# Patient Record
Sex: Male | Born: 1950 | Race: Asian | Hispanic: No | Marital: Married | Smoking: Former smoker
Health system: Southern US, Community
[De-identification: ages and names within clinical notes are randomized; demographics above are authoritative.]

## PROBLEM LIST (undated history)

## (undated) DIAGNOSIS — E119 Type 2 diabetes mellitus without complications: Secondary | ICD-10-CM

## (undated) DIAGNOSIS — I10 Essential (primary) hypertension: Secondary | ICD-10-CM

## (undated) DIAGNOSIS — K219 Gastro-esophageal reflux disease without esophagitis: Secondary | ICD-10-CM

## (undated) DIAGNOSIS — H269 Unspecified cataract: Secondary | ICD-10-CM

## (undated) DIAGNOSIS — M1990 Unspecified osteoarthritis, unspecified site: Secondary | ICD-10-CM

## (undated) HISTORY — PX: OTHER SURGICAL HISTORY: SHX169

## (undated) HISTORY — DX: Type 2 diabetes mellitus without complications: E11.9

## (undated) HISTORY — DX: Unspecified osteoarthritis, unspecified site: M19.90

## (undated) HISTORY — DX: Unspecified cataract: H26.9

---

## 2011-09-01 ENCOUNTER — Other Ambulatory Visit: Payer: Self-pay | Admitting: Infectious Diseases

## 2011-09-01 ENCOUNTER — Ambulatory Visit
Admission: RE | Admit: 2011-09-01 | Discharge: 2011-09-01 | Disposition: A | Discharge status: Home / Self Care | Payer: No Typology Code available for payment source | Source: Ambulatory Visit | Attending: Infectious Diseases | Admitting: Infectious Diseases

## 2011-09-01 DIAGNOSIS — R7611 Nonspecific reaction to tuberculin skin test without active tuberculosis: Secondary | ICD-10-CM

## 2012-07-19 ENCOUNTER — Emergency Department (HOSPITAL_COMMUNITY)
Admission: EM | Admit: 2012-07-19 | Discharge: 2012-07-20 | Disposition: A | Discharge status: Home / Self Care | Payer: Medicaid Other | Attending: Emergency Medicine | Admitting: Emergency Medicine

## 2012-07-19 ENCOUNTER — Encounter (HOSPITAL_COMMUNITY): Payer: Self-pay | Admitting: Emergency Medicine

## 2012-07-19 DIAGNOSIS — M545 Low back pain, unspecified: Secondary | ICD-10-CM

## 2012-07-19 NOTE — ED Notes (Signed)
Pt speaks [place]; son at bedside translating for pt; reports that pt was going to bathroom and sneezed, and pull muscle in L side/lower back; now c/o pain with movement; no loss of bowels; reports stiffness

## 2012-07-20 MED ORDER — KETOROLAC TROMETHAMINE 60 MG/2ML IM SOLN
60.0000 mg | Freq: Once | INTRAMUSCULAR | Status: AC
  Administered 2012-07-20: 60 mg via INTRAMUSCULAR
  Filled 2012-07-20: qty 2

## 2012-07-20 MED ORDER — IBUPROFEN 600 MG PO TABS: 600.0000 mg | ORAL_TABLET | Freq: Four times a day (QID) | ORAL | Status: AC | PRN

## 2012-07-20 MED ORDER — CYCLOBENZAPRINE HCL 5 MG PO TABS
5.0000 mg | ORAL_TABLET | Freq: Three times a day (TID) | ORAL | Status: AC | PRN
Start: 2012-07-20 — End: 2012-07-30

## 2012-07-20 NOTE — ED Provider Notes (Signed)
Medical screening examination/treatment/procedure(s) were performed by non-physician practitioner and as supervising physician I was immediately available for consultation/collaboration.  Arieal Cuoco K Hazell Siwik, MD 07/20/12 0707 

## 2012-07-20 NOTE — ED Provider Notes (Signed)
History     CSN: [id number]  Arrival date & time 07/19/12  2300   First MD Initiated Contact with Patient 07/20/12 0121      Chief Complaint  Patient presents with  . Back Pain    (Consider location/radiation/quality/duration/timing/severity/associated sxs/prior treatment) HPI Comments: Patient presents emergency department with chief complaint of back pain.  Translator for language [place] by son at bedside.  Onset of symptoms began acutely today after patient sneezed.  Described as left-sided muscular pain that worsens with certain movements including twisting and lifting left leg.  Patient denies any radicular pains including radiation, numbness or tingling of extremity, extremity weakness, loss control of bowel or bladder, fever, night sweats, chills, hematuria or any trauma.  No other complaints at this time.  Patient states that he did not have a history of kidney disease, kidney stones, or GI bleeds.  Patient is a 61 y.o. male presenting with back pain. The history is provided by the patient.  Back Pain     History reviewed. No pertinent past medical history.  History reviewed. No pertinent past surgical history.  No family history on file.  History  Substance Use Topics  . Smoking status: Never Smoker   . Smokeless tobacco: Not on file  . Alcohol Use: No      Review of Systems  Musculoskeletal: Positive for back pain. Negative for gait problem.  All other systems reviewed and are negative.    Allergies  Review of patient's allergies indicates no known allergies.  Home Medications  No current outpatient prescriptions on file.  BP 165/86  Temp 98.4 F (36.9 C) (Oral)  Resp 18  SpO2 95%  Physical Exam  Nursing note and vitals reviewed. Constitutional: He is oriented to person, place, and time. He appears well-developed and well-nourished. No distress.  HENT:  Head: Normocephalic and atraumatic.  Eyes: Conjunctivae and EOM are normal. Pupils are equal,  round, and reactive to light. No scleral icterus.  Neck: Normal range of motion and full passive range of motion without pain. Neck supple. No spinous process tenderness and no muscular tenderness present. No rigidity. Normal range of motion present. No Brudzinski's sign noted.  Cardiovascular: Normal rate, regular rhythm and intact distal pulses.  Exam reveals no gallop and no friction rub.   No murmur heard. Pulmonary/Chest: Effort normal and breath sounds normal. No respiratory distress. He has no wheezes. He has no rales. He exhibits no tenderness.  Genitourinary:       No vibrational CVA tenderness  Musculoskeletal:       Cervical back: He exhibits normal range of motion, no tenderness, no bony tenderness and no pain.       Thoracic back: He exhibits no tenderness, no bony tenderness and no pain.       Lumbar back: He exhibits tenderness and pain. He exhibits no spasm and normal pulse.       Back:       Right foot: He exhibits no swelling.       Left foot: He exhibits no swelling.       Bilateral lower extremities nontender without color change, baseline range of motion of extremities with intact distal pulses, capillary refill less than 2 seconds bilaterally.  Pt has increased pain w ROM of lumbar spine. Pain w ambulation, no sign of ataxia.  Neurological: He is alert and oriented to person, place, and time. He has normal strength and normal reflexes. No sensory deficit. Gait (no ataxia, slowed and hunched d/t pain )  abnormal.       Sensation at baseline for light touch in all 4 distal extremities, motor symmetric & bilateral 5/5 (hips: abduction, adduction, flexion; knee: flexion & extension; foot: dorsiflexion, plantar flexion, toes: dorsi flexion) Patellar & ankle reflexes intact.   Skin: Skin is warm and dry. No rash noted. He is not diaphoretic. No erythema. No pallor.  Psychiatric: He has a normal mood and affect.    ED Course  Procedures (including critical care time)  Labs  Reviewed - No data to display No results found.   No diagnosis found.    MDM  Low back pain  Patient with back pain likely etiology is muscular.  No neurological deficits and normal neuro exam.  Patient can walk but states is painful.  No loss of bowel or bladder control.  No concern for cauda equina.  No fever, night sweats, weight loss, h/o cancer, IVDU.  RICE protocol and pain medicine indicated and discussed with patient.         Jaci Carrel, [place] 07/20/12 0131

## 2012-07-20 NOTE — ED Notes (Signed)
Pt denies any questions upon discharge. 

## 2015-01-08 ENCOUNTER — Ambulatory Visit
Admission: RE | Admit: 2015-01-08 | Discharge: 2015-01-08 | Disposition: A | Discharge status: Home / Self Care | Payer: Medicaid Other | Source: Ambulatory Visit | Attending: Internal Medicine | Admitting: Internal Medicine

## 2015-01-08 ENCOUNTER — Other Ambulatory Visit: Payer: Self-pay | Admitting: Internal Medicine

## 2015-01-08 DIAGNOSIS — M25511 Pain in right shoulder: Secondary | ICD-10-CM

## 2016-08-09 ENCOUNTER — Encounter (HOSPITAL_COMMUNITY): Payer: Self-pay | Admitting: *Deleted

## 2016-08-09 ENCOUNTER — Emergency Department (HOSPITAL_COMMUNITY)
Admission: EM | Admit: 2016-08-09 | Discharge: 2016-08-09 | Disposition: A | Discharge status: Home / Self Care | Payer: Medicaid Other | Attending: Emergency Medicine | Admitting: Emergency Medicine

## 2016-08-09 ENCOUNTER — Emergency Department (HOSPITAL_COMMUNITY): Payer: Medicaid Other

## 2016-08-09 DIAGNOSIS — I10 Essential (primary) hypertension: Secondary | ICD-10-CM | POA: Insufficient documentation

## 2016-08-09 DIAGNOSIS — Z79899 Other long term (current) drug therapy: Secondary | ICD-10-CM | POA: Diagnosis not present

## 2016-08-09 DIAGNOSIS — R1011 Right upper quadrant pain: Secondary | ICD-10-CM | POA: Diagnosis present

## 2016-08-09 DIAGNOSIS — R1010 Upper abdominal pain, unspecified: Secondary | ICD-10-CM

## 2016-08-09 HISTORY — DX: Essential (primary) hypertension: I10

## 2016-08-09 LAB — CBC
HCT: 43 % (ref 39.0–52.0)
Hemoglobin: 15 g/dL (ref 13.0–17.0)
MCH: 30.5 pg (ref 26.0–34.0)
MCHC: 34.9 g/dL (ref 30.0–36.0)
MCV: 87.6 fL (ref 78.0–100.0)
Platelets: 147 10*3/uL — ABNORMAL LOW (ref 150–400)
RBC: 4.91 MIL/uL (ref 4.22–5.81)
RDW: 12.1 % (ref 11.5–15.5)
WBC: 4.8 10*3/uL (ref 4.0–10.5)

## 2016-08-09 LAB — COMPREHENSIVE METABOLIC PANEL
ALT: 33 U/L (ref 17–63)
AST: 33 U/L (ref 15–41)
Albumin: 4 g/dL (ref 3.5–5.0)
Alkaline Phosphatase: 75 U/L (ref 38–126)
Anion gap: 10 (ref 5–15)
BUN: 9 mg/dL (ref 6–20)
CO2: 23 mmol/L (ref 22–32)
Calcium: 8.8 mg/dL — ABNORMAL LOW (ref 8.9–10.3)
Chloride: 105 mmol/L (ref 101–111)
Creatinine, Ser: 0.99 mg/dL (ref 0.61–1.24)
GFR calc Af Amer: >60 mL/min (ref >60)
GFR calc non Af Amer: >60 mL/min (ref >60)
Glucose, Bld: 123 mg/dL — ABNORMAL HIGH (ref 65–99)
Potassium: 3.6 mmol/L (ref 3.5–5.1)
Sodium: 138 mmol/L (ref 135–145)
Total Bilirubin: 1.2 mg/dL (ref 0.3–1.2)
Total Protein: 6.7 g/dL (ref 6.5–8.1)

## 2016-08-09 LAB — URINALYSIS, ROUTINE W REFLEX MICROSCOPIC
BILIRUBIN URINE: NEGATIVE
Glucose, UA: NEGATIVE mg/dL
Hgb urine dipstick: NEGATIVE
Ketones, ur: NEGATIVE mg/dL
LEUKOCYTES UA: NEGATIVE
NITRITE: NEGATIVE
PH: 6 (ref 5.0–8.0)
Protein, ur: NEGATIVE mg/dL
SPECIFIC GRAVITY, URINE: 1.007 (ref 1.005–1.030)

## 2016-08-09 LAB — I-STAT TROPONIN, ED: Troponin i, poc: 0 ng/mL (ref 0.00–0.08)

## 2016-08-09 LAB — LIPASE, BLOOD: Lipase: 26 U/L (ref 11–51)

## 2016-08-09 MED ORDER — MORPHINE SULFATE (PF) 4 MG/ML IV SOLN
4.0000 mg | Freq: Once | INTRAVENOUS | Status: AC
  Administered 2016-08-09: 4 mg via INTRAVENOUS
  Filled 2016-08-09: qty 1

## 2016-08-09 MED ORDER — ESOMEPRAZOLE MAGNESIUM 40 MG PO CPDR: 40.0000 mg | DELAYED_RELEASE_CAPSULE | Freq: Every day | ORAL | 0 refills | Status: DC

## 2016-08-09 MED ORDER — SUCRALFATE 1 G PO TABS: 1.0000 g | ORAL_TABLET | Freq: Three times a day (TID) | ORAL | 0 refills | Status: DC

## 2016-08-09 MED ORDER — SODIUM CHLORIDE 0.9 % IV BOLUS (SEPSIS)
1000.0000 mL | Freq: Once | INTRAVENOUS | Status: AC
  Administered 2016-08-09: 1000 mL via INTRAVENOUS

## 2016-08-09 NOTE — ED Provider Notes (Signed)
[facility] DEPT Provider Note   CSN: [id number] Arrival date & time: 08/09/16  [id number]  First Provider Contact:  First MD Initiated Contact with Patient 08/09/16 0446        History   Chief Complaint Chief Complaint  Patient presents with  . Abdominal Pain    HPI Jorge [name] is a 65 y.o. male history hypertension here presenting with right upper quadrant pain. Right upper quadrant pain radiating to the back for the last several days. It is intermittent and cramping. Associated with some nausea but no vomiting. Patient saw primary care doctor several days ago was thought to have reflux or gallbladder disease. He was scheduled for a ultrasound but hasn't had it yet. Yesterday, he states that pain suddenly became worse and it was 7/10. Denies dysuria or hematuria. Denies vomiting or fevers.   The history is provided by the patient.    Past Medical History:  Diagnosis Date  . Hypertension     There are no active problems to display for this patient.   History reviewed. No pertinent surgical history.     Home Medications    Prior to Admission medications   Medication Sig Start Date End Date Taking? Authorizing Provider  amLODipine (NORVASC) 5 MG tablet Take 5 mg by mouth daily. 07/16/16  Yes Historical Provider, MD  atorvastatin (LIPITOR) 10 MG tablet Take 10 mg by mouth at bedtime. 08/03/16  Yes Historical Provider, MD  hydrochlorothiazide (HYDRODIURIL) 12.5 MG tablet Take 12.5 mg by mouth daily. 08/03/16  Yes Historical Provider, MD  omeprazole (PRILOSEC) 20 MG capsule Take 20 mg by mouth 2 (two) times daily. 08/03/16  Yes Historical Provider, MD    Family History History reviewed. No pertinent family history.  Social History Social History  Substance Use Topics  . Smoking status: Never Smoker  . Smokeless tobacco: Never Used  . Alcohol use No     Allergies   Review of patient's allergies indicates no known allergies.   Review of Systems Review of Systems    Gastrointestinal: Positive for abdominal pain.  All other systems reviewed and are negative.    Physical Exam Updated Vital Signs BP 143/84   Pulse (!) 52   Temp 97.6 F (36.4 C) (Oral)   Resp 17   Ht 5' (1.524 m)   Wt 180 lb (81.6 kg)   SpO2 94%   BMI 35.15 kg/m   Physical Exam  Constitutional: He is oriented to person, place, and time.  Slightly uncomfortable   HENT:  Head: Normocephalic.  Eyes: EOM are normal. Pupils are equal, round, and reactive to light.  Neck: Normal range of motion. Neck supple.  Cardiovascular: Normal rate, regular rhythm and normal heart sounds.   Pulmonary/Chest: Effort normal and breath sounds normal.  Abdominal: Soft. Bowel sounds are normal.  Mild RUQ tenderness, no rebound. No murphy's sign. No epigastric or CVA or lower abdominal tenderness   Musculoskeletal: Normal range of motion.  Neurological: He is alert and oriented to person, place, and time.  Skin: Skin is warm.  Psychiatric: He has a normal mood and affect.  Nursing note and vitals reviewed.    ED Treatments / Results  Labs (all labs ordered are listed, but only abnormal results are displayed) Labs Reviewed  COMPREHENSIVE METABOLIC PANEL - Abnormal; Notable for the following:       Result Value   Glucose, Bld 123 (*)    Calcium 8.8 (*)    All other components within normal limits  CBC -  Abnormal; Notable for the following:    Platelets 147 (*)    All other components within normal limits  LIPASE, BLOOD  URINALYSIS, ROUTINE W REFLEX MICROSCOPIC (NOT AT [facility])  I-STAT TROPOININ, ED    EKG  EKG Interpretation  Date/Time:  Sunday August 09 2016 04:52:46 EDT Ventricular Rate:  53 PR Interval:    QRS Duration: 91 QT Interval:  406 QTC Calculation: 382 R Axis:   39 Text Interpretation:  Sinus rhythm ST elevation suggests acute pericarditis No previous ECGs available Confirmed by YAO  MD, DAVID ([id number]) on 08/09/2016 4:55:27 AM       Radiology US Abdomen Limited  Ruq  Result Date: 08/09/2016 CLINICAL DATA:  Acute onset of right upper quadrant abdominal pain. Initial encounter. EXAM: US ABDOMEN LIMITED - RIGHT UPPER QUADRANT COMPARISON:  None. FINDINGS: Gallbladder: No gallstones or wall thickening visualized. No sonographic Murphy sign noted by sonographer. Common bile duct: Diameter: 0.2 cm, within normal in caliber. Liver: No focal lesion identified. Within normal limits in parenchymal echogenicity. IMPRESSION: Unremarkable ultrasound of the right upper quadrant. Electronically Signed   By: Garald Balding M.D.   On: 08/09/2016 06:41    Procedures Procedures (including critical care time)  Medications Ordered in ED Medications  sodium chloride 0.9 % bolus 1,000 mL (1,000 mLs Intravenous New Bag/Given 08/09/16 0515)  morphine 4 MG/ML injection 4 mg (4 mg Intravenous Given 08/09/16 0516)     Initial Impression / Assessment and Plan / ED Course  I have reviewed the triage vital signs and the nursing notes.  Pertinent labs & imaging results that were available during my care of the patient were reviewed by me and considered in my medical decision making (see chart for details).  Clinical Course    Jorge [name] is a 65 y.o. male here with abdominal pain. Consider biliary colic vs gastritis vs reflux. Less likely renal colic or inferior MI. Will get labs, EKG, lipase, trop x 1, [place] RUQ.   6:49 AM Labs and RUQ US unremarkable. UA unremarkable, no blood in the urine. He is pain free. Likely reflux vs stomach ulcer. Will start nexium, carafate. Will refer to GI.    Final Clinical Impressions(s) / ED Diagnoses   Final diagnoses:  RUQ pain    New Prescriptions New Prescriptions   No medications on file     Drenda Freeze, MD 08/09/16 [phone]

## 2016-08-09 NOTE — ED Notes (Signed)
Patient transported to Ultrasound 

## 2016-08-09 NOTE — ED Notes (Signed)
Language barrier. Family member at bedside to translate.

## 2016-08-09 NOTE — Discharge Instructions (Signed)
Take nexium daily.   Take carafate as needed for abdominal pain with meals.   See your doctor and call GI doctor.   Return to ER if you have severe abdominal pain, vomiting.

## 2016-08-09 NOTE — ED Notes (Signed)
Patient returned from US.

## 2016-08-09 NOTE — ED Triage Notes (Signed)
Pt reports RUQ starting last night. Pt denies n/v/d, denies dysuria, constipation. Pt describes the pain as intermittent cramping, pain radiates to right shoulder blade

## 2016-11-15 ENCOUNTER — Encounter (HOSPITAL_COMMUNITY): Payer: Self-pay | Admitting: *Deleted

## 2016-11-15 ENCOUNTER — Emergency Department (HOSPITAL_COMMUNITY): Payer: Medicaid Other

## 2016-11-15 ENCOUNTER — Emergency Department (HOSPITAL_COMMUNITY)
Admission: EM | Admit: 2016-11-15 | Discharge: 2016-11-15 | Disposition: A | Discharge status: Home / Self Care | Payer: Medicaid Other | Attending: Emergency Medicine | Admitting: Emergency Medicine

## 2016-11-15 DIAGNOSIS — M5442 Lumbago with sciatica, left side: Secondary | ICD-10-CM | POA: Diagnosis not present

## 2016-11-15 DIAGNOSIS — R339 Retention of urine, unspecified: Secondary | ICD-10-CM

## 2016-11-15 DIAGNOSIS — M545 Low back pain: Secondary | ICD-10-CM | POA: Diagnosis present

## 2016-11-15 DIAGNOSIS — I10 Essential (primary) hypertension: Secondary | ICD-10-CM | POA: Diagnosis not present

## 2016-11-15 DIAGNOSIS — E119 Type 2 diabetes mellitus without complications: Secondary | ICD-10-CM | POA: Insufficient documentation

## 2016-11-15 DIAGNOSIS — M5432 Sciatica, left side: Secondary | ICD-10-CM

## 2016-11-15 HISTORY — DX: Type 2 diabetes mellitus without complications: E11.9

## 2016-11-15 LAB — URINALYSIS, ROUTINE W REFLEX MICROSCOPIC
Bilirubin Urine: NEGATIVE
GLUCOSE, UA: NEGATIVE mg/dL
HGB URINE DIPSTICK: NEGATIVE
Ketones, ur: NEGATIVE mg/dL
Leukocytes, UA: NEGATIVE
Nitrite: NEGATIVE
PROTEIN: NEGATIVE mg/dL
SPECIFIC GRAVITY, URINE: 1.01 (ref 1.005–1.030)
pH: 6.5 (ref 5.0–8.0)

## 2016-11-15 LAB — COMPREHENSIVE METABOLIC PANEL
ALBUMIN: 4.4 g/dL (ref 3.5–5.0)
ALT: 30 U/L (ref 17–63)
AST: 27 U/L (ref 15–41)
Alkaline Phosphatase: 71 U/L (ref 38–126)
Anion gap: 11 (ref 5–15)
BUN: 18 mg/dL (ref 6–20)
CHLORIDE: 106 mmol/L (ref 101–111)
CO2: 19 mmol/L — AB (ref 22–32)
CREATININE: 0.94 mg/dL (ref 0.61–1.24)
Calcium: 9.4 mg/dL (ref 8.9–10.3)
GFR calc non Af Amer: >60 mL/min (ref >60)
Glucose, Bld: 147 mg/dL — ABNORMAL HIGH (ref 65–99)
Potassium: 4 mmol/L (ref 3.5–5.1)
SODIUM: 136 mmol/L (ref 135–145)
Total Bilirubin: 1.1 mg/dL (ref 0.3–1.2)
Total Protein: 7 g/dL (ref 6.5–8.1)

## 2016-11-15 LAB — CBC
HCT: 43.2 % (ref 39.0–52.0)
Hemoglobin: 16 g/dL (ref 13.0–17.0)
MCH: 32.3 pg (ref 26.0–34.0)
MCHC: 37 g/dL — ABNORMAL HIGH (ref 30.0–36.0)
MCV: 87.1 fL (ref 78.0–100.0)
PLATELETS: 163 10*3/uL (ref 150–400)
RBC: 4.96 MIL/uL (ref 4.22–5.81)
RDW: 12.8 % (ref 11.5–15.5)
WBC: 7.1 10*3/uL (ref 4.0–10.5)

## 2016-11-15 LAB — LIPASE, BLOOD: LIPASE: 26 U/L (ref 11–51)

## 2016-11-15 LAB — D-DIMER, QUANTITATIVE: D-Dimer, Quant: 0.32 ug/mL-FEU (ref 0.00–0.50)

## 2016-11-15 LAB — CBG MONITORING, ED: Glucose-Capillary: 134 mg/dL — ABNORMAL HIGH (ref 65–99)

## 2016-11-15 MED ORDER — HYDROCODONE-ACETAMINOPHEN 5-325 MG PO TABS: 2.0000 | ORAL_TABLET | ORAL | 0 refills | Status: DC | PRN

## 2016-11-15 MED ORDER — PREDNISONE 20 MG PO TABS: 60.0000 mg | ORAL_TABLET | Freq: Every day | ORAL | 0 refills | Status: AC

## 2016-11-15 MED ORDER — HYDROCODONE-ACETAMINOPHEN 5-325 MG PO TABS
2.0000 | ORAL_TABLET | Freq: Once | ORAL | Status: AC
  Administered 2016-11-15: 2 via ORAL
  Filled 2016-11-15: qty 2

## 2016-11-15 MED ORDER — SENNOSIDES-DOCUSATE SODIUM 8.6-50 MG PO TABS: 1.0000 | ORAL_TABLET | Freq: Every day | ORAL | 0 refills | Status: DC

## 2016-11-15 NOTE — ED Notes (Signed)
Pt was placed on 3L [place], O2 sats 88 RA. Pt also placed on cardiac monitor. EDP aware

## 2016-11-15 NOTE — ED Triage Notes (Signed)
The pt is c/o rt flank pain for 3 days  No nv no bloody urine

## 2016-11-15 NOTE — ED Provider Notes (Signed)
Blood pressure 119/76, pulse (!) 45, temperature 97.4 F (36.3 C), resp. rate 19, height 5\' 1"  (1.549 m), weight 185 lb 3.2 oz (84 kg), SpO2 96 %.  Assuming care from Dr. Wyvonnia Dusky.  In short, Jorge [name] is a 65 y.o. male with a chief complaint of Flank Pain .  Refer to the original H&P for additional details.  The current plan of care is to follow MRI and reassess.  09:48 AM MRI resulted with read reviewed. No severe spinal canal stenosis. 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 With his urinary retention and I plan to discuss the case with neurosurgery to arrange close outpatient follow-up.  10:07 AM Spoke with Dr. Cyndy Freeze with NSG. He has reviewed the MRI and sees no acute finding to explain the patient's post void residual. He does have disc disease and may require surgery at some point. He would like to have the patient follow with him in 2 weeks. We will discharge home with steroids and pain medication. The patient is urinating here without difficulty.   Nanda Quinton, MD   Margette Fast, MD 11/16/16 [phone]

## 2016-11-15 NOTE — ED Provider Notes (Signed)
[facility] DEPT Provider Note   CSN: [id number] Arrival date & time: 11/15/16  0105     History   Chief Complaint Chief Complaint  Patient presents with  . Flank Pain    HPI Jorge [name] is a 65 y.o. male.  Level V caveat for language barrier. History obtained through son. Son reports three-day history of right lower back pain rating down right leg. No falls no injuries. Contrary to triage note there is no flank pain. No nausea no vomiting noted hematuria or dysuria. Patient denies any focal weakness, numbness or tingling. No bowel or bladder incontinence. No fever or vomiting. No chest pain or shortness of breath. Has a chronic cough which is unchanged. Initially hypoxic upon arrival to treatment room. Denies any shortness of breath or chest pain. Denies any IV drug abuse or history of cancer. No recent travel outside the country. No saddle anesthesia.   The history is provided by the patient and a relative. The history is limited by a language barrier.  Flank Pain  Pertinent negatives include no chest pain, no abdominal pain, no headaches and no shortness of breath.    Past Medical History:  Diagnosis Date  . Diabetes mellitus without complication ([place])   . Hypertension     There are no active problems to display for this patient.   History reviewed. No pertinent surgical history.     Home Medications    Prior to Admission medications   Medication Sig Start Date End Date Taking? Authorizing Provider  amLODipine (NORVASC) 5 MG tablet Take 5 mg by mouth daily. 07/16/16   Historical Provider, MD  atorvastatin (LIPITOR) 10 MG tablet Take 10 mg by mouth at bedtime. 08/03/16   Historical Provider, MD  esomeprazole (NEXIUM) 40 MG capsule Take 1 capsule (40 mg total) by mouth daily. 08/09/16   Drenda Freeze, MD  hydrochlorothiazide (HYDRODIURIL) 12.5 MG tablet Take 12.5 mg by mouth daily. 08/03/16   Historical Provider, MD  omeprazole (PRILOSEC) 20 MG capsule Take 20  mg by mouth 2 (two) times daily. 08/03/16   Historical Provider, MD  sucralfate (CARAFATE) 1 g tablet Take 1 tablet (1 g total) by mouth 4 (four) times daily -  with meals and at bedtime. 08/09/16   Drenda Freeze, MD    Family History No family history on file.  Social History Social History  Substance Use Topics  . Smoking status: Never Smoker  . Smokeless tobacco: Never Used  . Alcohol use No     Allergies   Patient has no known allergies.   Review of Systems Review of Systems  Constitutional: Negative for activity change, appetite change, fever and unexpected weight change.  HENT: Negative for congestion and rhinorrhea.   Eyes: Negative for visual disturbance.  Respiratory: Positive for cough. Negative for chest tightness and shortness of breath.   Cardiovascular: Negative for chest pain.  Gastrointestinal: Negative for abdominal pain, nausea and vomiting.  Genitourinary: Negative for flank pain.  Musculoskeletal: Positive for back pain. Negative for arthralgias.  Neurological: Negative for dizziness, seizures, weakness, numbness and headaches.   A complete 10 system review of systems was obtained and all systems are negative except as noted in the HPI and PMH.    Physical Exam Updated Vital Signs BP 158/87   Pulse (!) 55   Temp 97.4 F (36.3 C)   Resp 14   Ht 5\' 1"  (1.549 m)   Wt 185 lb 3.2 oz (84 kg)   SpO2 93%  BMI 34.99 kg/m   Physical Exam  Constitutional: He is oriented to person, place, and time. He appears well-developed and well-nourished. No distress.  HENT:  Head: Normocephalic and atraumatic.  Mouth/Throat: Oropharynx is clear and moist. No oropharyngeal exudate.  Eyes: Conjunctivae and EOM are normal. Pupils are equal, round, and reactive to light.  Neck: Normal range of motion. Neck supple.  No meningismus.  Cardiovascular: Normal rate, regular rhythm, normal heart sounds and intact distal pulses.   No murmur heard. Pulmonary/Chest: Effort  normal and breath sounds normal. No respiratory distress.  Abdominal: Soft. There is no tenderness. There is no rebound and no guarding.  Musculoskeletal: Normal range of motion. He exhibits tenderness. He exhibits no edema.  TTP R SI joint  5/5 strength in bilateral lower extremities. Ankle plantar and dorsiflexion intact. Great toe extension intact bilaterally. +2 DP and PT pulses. +2 patellar reflexes bilaterally. Normal gait.   Neurological: He is alert and oriented to person, place, and time. No cranial nerve deficit. He exhibits normal muscle tone. Coordination normal.   5/5 strength throughout. CN 2-12 intact.Equal grip strength.   Skin: Skin is warm.  Psychiatric: He has a normal mood and affect. His behavior is normal.  Nursing note and vitals reviewed.    ED Treatments / Results  Labs (all labs ordered are listed, but only abnormal results are displayed) Labs Reviewed  COMPREHENSIVE METABOLIC PANEL - Abnormal; Notable for the following:       Result Value   CO2 19 (*)    Glucose, Bld 147 (*)    All other components within normal limits  CBC - Abnormal; Notable for the following:    MCHC 37.0 (*)    All other components within normal limits  CBG MONITORING, ED - Abnormal; Notable for the following:    Glucose-Capillary 134 (*)    All other components within normal limits  LIPASE, BLOOD  URINALYSIS, ROUTINE W REFLEX MICROSCOPIC (NOT AT [facility])  D-DIMER, QUANTITATIVE (NOT AT [facility])    EKG  EKG Interpretation  Date/Time:  Sunday November 15 2016 05:38:19 EST Ventricular Rate:  58 PR Interval:    QRS Duration: 92 QT Interval:  398 QTC Calculation: 391 R Axis:   35 Text Interpretation:  Sinus rhythm Abnormal inferior Q waves ST elevation suggests acute pericarditis Baseline wander in lead(s) V3 No significant change was found Confirmed by Wyvonnia Dusky  MD, Yuniel Blaney [id number]) on 11/15/2016 6:45:46 AM       Radiology No results found.  Procedures Procedures (including  critical care time)  Medications Ordered in ED Medications  HYDROcodone-acetaminophen (NORCO/VICODIN) 5-325 MG per tablet 2 tablet (not administered)     Initial Impression / Assessment and Plan / ED Course  I have reviewed the triage vital signs and the nursing notes.  Pertinent labs & imaging results that were available during my care of the patient were reviewed by me and considered in my medical decision making (see chart for details).  Clinical Course   3 days of right-sided lower back pain rating to the right leg appears to be consistent with sciatica. Contrary to triage note there is no flank pain. No focal weakness, numbness or tingling. No bowel or bladder incontinence. There are no neurological red flags.  Urinalysis is negative. CXR and D-dimer negative. Patient denies SOB or CP.  Patient has 450 mL of postvoid residual was severe right sided sciatica pain. Plan to obtain MRI and given urinary retention.  MRI pending for sciatica symptoms with urinary retention.  Dr. Laverta Baltimore to assume care.  Anticipate discharge home with steroid burst if MRI reassuring with possible Foley catheter if still retaining.  Final Clinical Impressions(s) / ED Diagnoses   Final diagnoses:  Urinary retention  Sciatica of left side    New Prescriptions New Prescriptions   No medications on file     Ezequiel Essex, MD 11/15/16 [phone]

## 2016-11-15 NOTE — Discharge Instructions (Signed)
You have been seen in the Emergency Department (ED)  today for back pain.  Your workup and exam have not shown any acute abnormalities and you are likely suffering from muscle strain or possible problems with your discs, but there is no treatment that will fix your symptoms at this time.  Please take Motrin (ibuprofen) as needed for your pain according to the instructions written on the box.  Alternatively, for the next five days you can take 600mg  three times daily with meals (it may upset your stomach).  Take Norco as prescribed for severe pain. Do not drink alcohol, drive or participate in any other potentially dangerous activities while taking this medication as it may make you sleepy. Do not take this medication with any other sedating medications, either prescription or over-the-counter. If you were prescribed Percocet or Vicodin, do not take these with acetaminophen (Tylenol) as it is already contained within these medications.   This medication is an opiate (or narcotic) pain medication and can be habit forming.  Use it as little as possible to achieve adequate pain control.  Do not use or use it with extreme caution if you have a history of opiate abuse or dependence.  If you are on a pain contract with your primary care doctor or a pain specialist, be sure to let them know you were prescribed this medication today from the [facility] Emergency Department.  This medication is intended for your use only - do not give any to anyone else and keep it in a secure place where nobody else, especially children, have access to it.  It will also cause or worsen constipation, so you may want to consider taking an over-the-counter stool softener while you are taking this medication.  Please follow up with your doctor as soon as possible regarding today's ED visit and your back pain.  Return to the ED for worsening back pain, fever, weakness or numbness of either leg, or if you develop either (1) an  inability to urinate or have bowel movements, or (2) loss of your ability to control your bathroom functions (if you start having "accidents"), or if you develop other new symptoms that concern you.

## 2016-11-25 ENCOUNTER — Emergency Department (HOSPITAL_COMMUNITY)
Admission: EM | Admit: 2016-11-25 | Discharge: 2016-11-25 | Disposition: A | Discharge status: Home / Self Care | Payer: Medicaid Other | Attending: Emergency Medicine | Admitting: Emergency Medicine

## 2016-11-25 ENCOUNTER — Encounter (HOSPITAL_COMMUNITY): Payer: Self-pay | Admitting: *Deleted

## 2016-11-25 DIAGNOSIS — E119 Type 2 diabetes mellitus without complications: Secondary | ICD-10-CM | POA: Diagnosis not present

## 2016-11-25 DIAGNOSIS — M5020 Other cervical disc displacement, unspecified cervical region: Secondary | ICD-10-CM | POA: Diagnosis not present

## 2016-11-25 DIAGNOSIS — I10 Essential (primary) hypertension: Secondary | ICD-10-CM | POA: Insufficient documentation

## 2016-11-25 DIAGNOSIS — M545 Low back pain: Secondary | ICD-10-CM | POA: Diagnosis present

## 2016-11-25 DIAGNOSIS — M50222 Other cervical disc displacement at C5-C6 level: Secondary | ICD-10-CM

## 2016-11-25 DIAGNOSIS — Z79899 Other long term (current) drug therapy: Secondary | ICD-10-CM | POA: Insufficient documentation

## 2016-11-25 LAB — URINALYSIS, ROUTINE W REFLEX MICROSCOPIC
BILIRUBIN URINE: NEGATIVE
Glucose, UA: NEGATIVE mg/dL
HGB URINE DIPSTICK: NEGATIVE
Ketones, ur: NEGATIVE mg/dL
Leukocytes, UA: NEGATIVE
NITRITE: NEGATIVE
PROTEIN: NEGATIVE mg/dL
SPECIFIC GRAVITY, URINE: 1.017 (ref 1.005–1.030)
pH: 6.5 (ref 5.0–8.0)

## 2016-11-25 LAB — BASIC METABOLIC PANEL
Anion gap: 8 (ref 5–15)
BUN: 14 mg/dL (ref 6–20)
CO2: 24 mmol/L (ref 22–32)
CREATININE: 0.84 mg/dL (ref 0.61–1.24)
Calcium: 9 mg/dL (ref 8.9–10.3)
Chloride: 105 mmol/L (ref 101–111)
Glucose, Bld: 94 mg/dL (ref 65–99)
Potassium: 4.2 mmol/L (ref 3.5–5.1)
SODIUM: 137 mmol/L (ref 135–145)

## 2016-11-25 MED ORDER — OXYCODONE-ACETAMINOPHEN 5-325 MG PO TABS: 1.0000 | ORAL_TABLET | ORAL | 0 refills | Status: DC | PRN

## 2016-11-25 MED ORDER — ORPHENADRINE CITRATE ER 100 MG PO TB12: 100.0000 mg | ORAL_TABLET | Freq: Two times a day (BID) | ORAL | 0 refills | Status: DC

## 2016-11-25 MED ORDER — NAPROXEN 375 MG PO TABS: 375.0000 mg | ORAL_TABLET | Freq: Two times a day (BID) | ORAL | 0 refills | Status: DC

## 2016-11-25 MED ORDER — DOCUSATE SODIUM 100 MG PO CAPS: 100.0000 mg | ORAL_CAPSULE | Freq: Two times a day (BID) | ORAL | 0 refills | Status: DC

## 2016-11-25 MED ORDER — PREDNISONE 20 MG PO TABS
60.0000 mg | ORAL_TABLET | Freq: Once | ORAL | Status: AC
  Administered 2016-11-25: 60 mg via ORAL
  Filled 2016-11-25: qty 3

## 2016-11-25 MED ORDER — KETOROLAC TROMETHAMINE 60 MG/2ML IM SOLN
60.0000 mg | Freq: Once | INTRAMUSCULAR | Status: AC
  Administered 2016-11-25: 60 mg via INTRAMUSCULAR
  Filled 2016-11-25: qty 2

## 2016-11-25 MED ORDER — HYDROMORPHONE HCL 2 MG/ML IJ SOLN
2.0000 mg | Freq: Once | INTRAMUSCULAR | Status: AC
  Administered 2016-11-25: 2 mg via INTRAMUSCULAR
  Filled 2016-11-25: qty 1

## 2016-11-25 NOTE — ED Notes (Signed)
Phlebotomy at bedside.

## 2016-11-25 NOTE — Consult Note (Signed)
Jorge [name] is a 65 y.o. male Whom has made two trips to the ED for right lower extremity pain. Initially on 11/19 he had urinary retention, and right lower extremity pain. An MRI revealed two herniated discs eccentric to the right side at L3/4, and 4/5. He was supposed to schedule an appointment with my partner in the office. Due to a mix up he came back to the ED today with severe pain in the right lower extremity. He completed a course of steroids, and pain medication.  No Known Allergies History reviewed. No pertinent family history., Past Medical History:  Diagnosis Date  . Diabetes mellitus without complication ([place])   . Hypertension    History reviewed. No pertinent surgical history. Social History   Social History  . Marital status: Married    Spouse name: N/A  . Number of children: N/A  . Years of education: N/A   Occupational History  . Not on file.   Social History Main Topics  . Smoking status: Never Smoker  . Smokeless tobacco: Never Used  . Alcohol use No  . Drug use: No  . Sexual activity: Not on file   Other Topics Concern  . Not on file   Social History Narrative  . No narrative on file   Prior to Admission medications   Medication Sig Start Date End Date Taking? Authorizing Provider  amLODipine (NORVASC) 5 MG tablet Take 5 mg by mouth daily. 07/16/16  Yes Historical Provider, MD  esomeprazole (NEXIUM) 40 MG capsule Take 1 capsule (40 mg total) by mouth daily. Patient not taking: Reported on 11/25/2016 08/09/16   Drenda Freeze, MD  hydrochlorothiazide (HYDRODIURIL) 12.5 MG tablet Take 12.5 mg by mouth daily. 08/03/16   Historical Provider, MD  HYDROcodone-acetaminophen (NORCO/VICODIN) 5-325 MG tablet Take 2 tablets by mouth every 4 (four) hours as needed. Patient not taking: Reported on 11/25/2016 11/15/16   Margette Fast, MD  senna-docusate (SENOKOT-S) 8.6-50 MG tablet Take 1 tablet by mouth daily. Patient not taking: Reported on 11/25/2016 11/15/16    Margette Fast, MD  sucralfate (CARAFATE) 1 g tablet Take 1 tablet (1 g total) by mouth 4 (four) times daily -  with meals and at bedtime. Patient not taking: Reported on 11/25/2016 08/09/16   Drenda Freeze, MD  Physical Exam  Constitutional: He is oriented to person, place, and time. He appears well-developed and well-nourished.  HENT:  Head: Normocephalic and atraumatic.  Right Ear: External ear normal.  Left Ear: External ear normal.  Nose: Nose normal.  Mouth/Throat: Oropharynx is clear and moist.  Eyes: Conjunctivae and EOM are normal. Pupils are equal, round, and reactive to light.  Neck: Normal range of motion. Neck supple.  Cardiovascular: Normal rate, regular rhythm, normal heart sounds and intact distal pulses.   Pulmonary/Chest: Effort normal and breath sounds normal.  +cough  Abdominal: Soft. Bowel sounds are normal.  Musculoskeletal: Normal range of motion.  Neurological: He is alert and oriented to person, place, and time. He has normal reflexes. He displays normal reflexes. No cranial nerve deficit. He exhibits normal muscle tone. He displays a negative Romberg sign. Coordination normal. He displays no Babinski's sign on the right side. He displays no Babinski's sign on the left side.  Reflex Scores:      Tricep reflexes are 2+ on the right side and 2+ on the left side.      Bicep reflexes are 2+ on the right side and 2+ on the left side.  Brachioradialis reflexes are 2+ on the right side and 2+ on the left side.      Patellar reflexes are 2+ on the right side and 2+ on the left side.      Achilles reflexes are 2+ on the right side and 2+ on the left side. Mild weakness right dorsiflexors 4/5, ehl 4/5  Skin: Skin is warm and dry.  Psychiatric: He has a normal mood and affect. His behavior is normal. Judgment and thought content normal.  a/p Would like time to think about treatment. Will advise ed to discharge and he will follow up in the office.  Understands that he  has two disc herniations, I reviewed the mri with he and his daughter who acted as the interpreter.

## 2016-11-25 NOTE — ED Notes (Signed)
Neuro surgery at bedside.

## 2016-11-25 NOTE — ED Notes (Signed)
Bladder scan resulted at 274ml

## 2016-11-25 NOTE — ED Provider Notes (Signed)
[facility] DEPT Provider Note   CSN: [id number] Arrival date & time: 11/25/16  0755     History   Chief Complaint Chief Complaint  Patient presents with  . Back Pain  . Leg Pain    HPI Jorge [name] is a 65 y.o. male.Language barrier. Patient's daughter acts as Optometrist.  HPI Patient was seen 10 days ago with lower back pain. MRI obtained and L4-5 disc protrusion impinging on right L5 nerve identified. Patient was discharged with pain control steroids. Plan was to make follow-up appointment with Dr. Cyndy Freeze. Patient has finished his medications and is having the same pain which is severe in his SI region and radiating down the right leg.  Now he is endorsing some numbness in the leg as well. Patient has not developed, fever, chills, abdominal pain. He is endorsing some degree of discomfort with urination. The patient's daughter reports that they have not made a follow-up appointment with Dr. Cyndy Freeze. She states that the patient was initially seen with her brother for the first ED visit and she did not know that he needed a follow-up appointment with a specialist. Past Medical History:  Diagnosis Date  . Diabetes mellitus without complication ([place])   . Hypertension     There are no active problems to display for this patient.   History reviewed. No pertinent surgical history.     Home Medications    Prior to Admission medications   Medication Sig Start Date End Date Taking? Authorizing Provider  amLODipine (NORVASC) 5 MG tablet Take 5 mg by mouth daily. 07/16/16  Yes Historical Provider, MD  esomeprazole (NEXIUM) 40 MG capsule Take 1 capsule (40 mg total) by mouth daily. Patient not taking: Reported on 11/25/2016 08/09/16   Drenda Freeze, MD  hydrochlorothiazide (HYDRODIURIL) 12.5 MG tablet Take 12.5 mg by mouth daily. 08/03/16   Historical Provider, MD  HYDROcodone-acetaminophen (NORCO/VICODIN) 5-325 MG tablet Take 2 tablets by mouth every 4 (four) hours as  needed. Patient not taking: Reported on 11/25/2016 11/15/16   Margette Fast, MD  naproxen (NAPROSYN) 375 MG tablet Take 1 tablet (375 mg total) by mouth 2 (two) times daily. 11/25/16   Charlesetta Shanks, MD  orphenadrine (NORFLEX) 100 MG tablet Take 1 tablet (100 mg total) by mouth 2 (two) times daily. 11/25/16   Charlesetta Shanks, MD  oxyCODONE-acetaminophen (PERCOCET) 5-325 MG tablet Take 1-2 tablets by mouth every 4 (four) hours as needed. 11/25/16   Charlesetta Shanks, MD  senna-docusate (SENOKOT-S) 8.6-50 MG tablet Take 1 tablet by mouth daily. Patient not taking: Reported on 11/25/2016 11/15/16   Margette Fast, MD  sucralfate (CARAFATE) 1 g tablet Take 1 tablet (1 g total) by mouth 4 (four) times daily -  with meals and at bedtime. Patient not taking: Reported on 11/25/2016 08/09/16   Drenda Freeze, MD    Family History History reviewed. No pertinent family history.  Social History Social History  Substance Use Topics  . Smoking status: Never Smoker  . Smokeless tobacco: Never Used  . Alcohol use No     Allergies   Patient has no known allergies.   Review of Systems Review of Systems 10 Systems reviewed and are negative for acute change except as noted in the HPI.  Physical Exam Updated Vital Signs BP 140/69   Pulse 64   Temp 97.3 F (36.3 C) (Oral)   Resp 16   SpO2 94%   Physical Exam  Constitutional: He appears well-developed and well-nourished.  HENT:  Head: Normocephalic and atraumatic.  Eyes: Conjunctivae are normal.  Neck: Neck supple.  Cardiovascular: Normal rate and regular rhythm.   No murmur heard. Pulmonary/Chest: Effort normal and breath sounds normal. No respiratory distress.  Abdominal: Soft. He exhibits no distension and no mass. There is tenderness. There is no guarding.  Endorses mild suprapubic discomfort to palpation. No palpable mass or bladder distention.  Musculoskeletal: He exhibits no edema.  Straight leg raise on the right causes significant  pain. No peripheral edema or calf tenderness. Skin condition and muscular development of lower extremities is very good.  Neurological: He is alert. No cranial nerve deficit. Coordination normal.  Patient does have intact flexion and extension strength to the right lower extremity. He however does exhibit significant pain with performing strength testing. He endorses subjective decreased light touch sensation to the right lower extremity as compared to the left.  Skin: Skin is warm and dry.  Psychiatric: He has a normal mood and affect.  Nursing note and vitals reviewed.    ED Treatments / Results  Labs (all labs ordered are listed, but only abnormal results are displayed) Labs Reviewed  URINALYSIS, [place] (NOT AT [facility])  BASIC METABOLIC PANEL    EKG  EKG Interpretation None       Radiology No results found.  Procedures Procedures (including critical care time)  Medications Ordered in ED Medications  ketorolac (TORADOL) injection 60 mg (60 mg Intramuscular Given 11/25/16 1024)  HYDROmorphone (DILAUDID) injection 2 mg (2 mg Intramuscular Given 11/25/16 1024)  predniSONE (DELTASONE) tablet 60 mg (60 mg Oral Given 11/25/16 1024)     Initial Impression / Assessment and Plan / ED Course  I have reviewed the triage vital signs and the nursing notes.  Pertinent labs & imaging results that were available during my care of the patient were reviewed by me and considered in my medical decision making (see chart for details).  Clinical Course     Consult: Dr. Cyndy Freeze has evaluated the patient in the emergency department. Plan will be for outpatient follow-up with the pain control.  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   ORPHENADRINE (NORFLEX) 100 MG  TABLET    Take 1 tablet (100 mg total) by mouth 2 (two) times daily.   OXYCODONE-ACETAMINOPHEN (PERCOCET) 5-325 MG TABLET    Take 1-2 tablets by mouth every 4 (four) hours as needed.     Charlesetta Shanks, MD 11/25/16 [phone]

## 2016-11-25 NOTE — ED Triage Notes (Signed)
Pt reports right side back and leg pain for a few days. Denies injury to back. Has difficulty ambulating and reports mild numbness to right leg. Denies urinary symptoms.

## 2016-11-26 ENCOUNTER — Emergency Department (HOSPITAL_COMMUNITY)
Admission: EM | Admit: 2016-11-26 | Discharge: 2016-11-26 | Disposition: A | Discharge status: Home / Self Care | Payer: Medicaid Other | Attending: Emergency Medicine | Admitting: Emergency Medicine

## 2016-11-26 ENCOUNTER — Encounter (HOSPITAL_COMMUNITY): Payer: Self-pay | Admitting: Emergency Medicine

## 2016-11-26 DIAGNOSIS — Z79899 Other long term (current) drug therapy: Secondary | ICD-10-CM | POA: Insufficient documentation

## 2016-11-26 DIAGNOSIS — I10 Essential (primary) hypertension: Secondary | ICD-10-CM | POA: Insufficient documentation

## 2016-11-26 DIAGNOSIS — E119 Type 2 diabetes mellitus without complications: Secondary | ICD-10-CM | POA: Diagnosis not present

## 2016-11-26 DIAGNOSIS — M5136 Other intervertebral disc degeneration, lumbar region: Secondary | ICD-10-CM

## 2016-11-26 DIAGNOSIS — M5126 Other intervertebral disc displacement, lumbar region: Secondary | ICD-10-CM | POA: Diagnosis present

## 2016-11-26 MED ORDER — OXYCODONE-ACETAMINOPHEN 5-325 MG PO TABS
ORAL_TABLET | ORAL | Status: DC
Start: 2016-11-26 — End: 2016-11-26
  Filled 2016-11-26: qty 1

## 2016-11-26 MED ORDER — OXYCODONE-ACETAMINOPHEN 5-325 MG PO TABS
1.0000 | ORAL_TABLET | Freq: Once | ORAL | Status: AC
  Administered 2016-11-26: 1 via ORAL

## 2016-11-26 MED ORDER — HYDROMORPHONE HCL 2 MG/ML IJ SOLN
2.0000 mg | Freq: Once | INTRAMUSCULAR | Status: AC
  Administered 2016-11-26: 2 mg via INTRAVENOUS
  Filled 2016-11-26: qty 1

## 2016-11-26 NOTE — ED Triage Notes (Signed)
Pt. reports persistent right lower back pain radiating to right buttocks and right leg for several weeks , denies injury , pain increases with movement /changing positions . He was seen here yesterday for the same complaint discharged home with prescriptions .

## 2016-11-26 NOTE — ED Provider Notes (Signed)
[facility] DEPT Provider Note   CSN: [id number] Arrival date & time: 11/26/16  0504     History   Chief Complaint Chief Complaint  Patient presents with  . Back Pain    HPI Jorge [name] is a 65 y.o. male.  The history is provided by the patient and a relative. The history is limited by a language barrier. A language interpreter was used (Son).  Back Pain    Patient was seen yesterday for the same complaint. Yesterday. At that time neurosurgery was consult said, Dr. Cyndy Freeze, and patient was to follow up outpatient. Patient was discharged home with Naprosyn, Norflex, and Percocet. Prior to yesterday the patient was seen 11 days ago for the same complaint and at that time an MRI was obtained which showed an L4-5 disc protrusion impinging on the L5 nerve, and again neurosurgery was consulted and requested outpatient follow up. At that time, he was discharged home with steroid burst and pain medication. He has only taken the percocet which alleviates his pain.  They are going to fill the remaining prescriptions today.   Past Medical History:  Diagnosis Date  . Diabetes mellitus without complication ([place])   . Hypertension     There are no active problems to display for this patient.   History reviewed. No pertinent surgical history.     Home Medications    Prior to Admission medications   Medication Sig Start Date End Date Taking? Authorizing Provider  amLODipine (NORVASC) 5 MG tablet Take 5 mg by mouth daily. 07/16/16  Yes Historical Provider, MD  docusate sodium (COLACE) 100 MG capsule Take 1 capsule (100 mg total) by mouth every 12 (twelve) hours. 11/25/16  Yes Charlesetta Shanks, MD  hydrochlorothiazide (HYDRODIURIL) 12.5 MG tablet Take 12.5 mg by mouth daily. 08/03/16  Yes Historical Provider, MD  naproxen (NAPROSYN) 375 MG tablet Take 1 tablet (375 mg total) by mouth 2 (two) times daily. 11/25/16  Yes Charlesetta Shanks, MD  esomeprazole (NEXIUM) 40 MG capsule Take 1 capsule  (40 mg total) by mouth daily. Patient not taking: Reported on 11/26/2016 08/09/16   Drenda Freeze, MD  HYDROcodone-acetaminophen (NORCO/VICODIN) 5-325 MG tablet Take 2 tablets by mouth every 4 (four) hours as needed. Patient not taking: Reported on 11/26/2016 11/15/16   Margette Fast, MD  orphenadrine (NORFLEX) 100 MG tablet Take 1 tablet (100 mg total) by mouth 2 (two) times daily. 11/25/16   Charlesetta Shanks, MD  oxyCODONE-acetaminophen (PERCOCET) 5-325 MG tablet Take 1-2 tablets by mouth every 4 (four) hours as needed. 11/25/16   Charlesetta Shanks, MD  senna-docusate (SENOKOT-S) 8.6-50 MG tablet Take 1 tablet by mouth daily. Patient not taking: Reported on 11/26/2016 11/15/16   Margette Fast, MD  sucralfate (CARAFATE) 1 g tablet Take 1 tablet (1 g total) by mouth 4 (four) times daily -  with meals and at bedtime. Patient not taking: Reported on 11/26/2016 08/09/16   Drenda Freeze, MD    Family History No family history on file.  Social History Social History  Substance Use Topics  . Smoking status: Never Smoker  . Smokeless tobacco: Never Used  . Alcohol use No     Allergies   Patient has no known allergies.   Review of Systems Review of Systems  Musculoskeletal: Positive for back pain. Negative for gait problem.  All other systems reviewed and are negative.    Physical Exam Updated Vital Signs BP 147/85   Pulse 62   Temp 98.6 F (37  C) (Oral)   Resp 14   SpO2 93%   Physical Exam  Constitutional: He is oriented to person, place, and time. He appears well-developed and well-nourished.  HENT:  Head: Atraumatic.  Eyes: Conjunctivae are normal.  Cardiovascular: Normal rate, regular rhythm, normal heart sounds and intact distal pulses.   Pulses:      Dorsalis pedis pulses are 2+ on the right side, and 2+ on the left side.  Pulmonary/Chest: Effort normal and breath sounds normal.  Abdominal: Soft. Bowel sounds are normal. He exhibits no distension. There is no  tenderness.  Musculoskeletal: He exhibits tenderness.  No spinous process tenderness.  No step offs. No crepitus.  Neurological: He is alert and oriented to person, place, and time.  No saddle anesthesia. Strength and sensation intact bilaterally throughout lower extremities. Pain with strength testing on the right.  Skin: Skin is warm and dry.  Psychiatric: He has a normal mood and affect. His behavior is normal.     ED Treatments / Results  Labs (all labs ordered are listed, but only abnormal results are displayed) Labs Reviewed - No data to display  EKG  EKG Interpretation None       Radiology No results found.  Procedures Procedures (including critical care time)  Medications Ordered in ED Medications  oxyCODONE-acetaminophen (PERCOCET/ROXICET) 5-325 MG per tablet (not administered)  oxyCODONE-acetaminophen (PERCOCET/ROXICET) 5-325 MG per tablet 1 tablet (1 tablet Oral Given 11/26/16 0519)  HYDROmorphone (DILAUDID) injection 2 mg (2 mg Intravenous Given 11/26/16 0727)     Initial Impression / Assessment and Plan / ED Course  I have reviewed the triage vital signs and the nursing notes.  Pertinent labs & imaging results that were available during my care of the patient were reviewed by me and considered in my medical decision making (see chart for details).  Clinical Course    Patient presents with persistent back pain with known disc protrusion.  Neurosurgery has recommend outpatient follow up.  Patient has prescriptions for Norflex, Percocet, and Naprosyn.  He has only taken Percocet.  No focal neurological deficits today.  No concerning symptoms to warrant repeat imaging at this time.  There seems to be confusion on neurosurgery follow up, and patient and son thought they were to keep coming here.  Discussed that they were to call the number provided and go to that listed address.  They voiced understanding, and are calling them at 9 AM today when the office opens.   Return precautions discussed.  Stable for discharge.    Final Clinical Impressions(s) / ED Diagnoses   Final diagnoses:  Protrusion of lumbar intervertebral disc  Bulging of lumbar intervertebral disc    New Prescriptions Discharge Medication List as of 11/26/2016  8:11 AM       Gloriann Loan, PA-C 11/26/16 [id number]    Isla Pence, MD 11/26/16 [id number]

## 2016-11-28 ENCOUNTER — Encounter (HOSPITAL_COMMUNITY): Payer: Self-pay | Admitting: *Deleted

## 2016-11-28 ENCOUNTER — Emergency Department (HOSPITAL_COMMUNITY)
Admission: EM | Admit: 2016-11-28 | Discharge: 2016-11-28 | Disposition: A | Discharge status: Home / Self Care | Payer: Medicaid Other | Attending: Emergency Medicine | Admitting: Emergency Medicine

## 2016-11-28 DIAGNOSIS — Z79899 Other long term (current) drug therapy: Secondary | ICD-10-CM | POA: Insufficient documentation

## 2016-11-28 DIAGNOSIS — I10 Essential (primary) hypertension: Secondary | ICD-10-CM | POA: Insufficient documentation

## 2016-11-28 DIAGNOSIS — M5416 Radiculopathy, lumbar region: Secondary | ICD-10-CM | POA: Diagnosis not present

## 2016-11-28 DIAGNOSIS — E119 Type 2 diabetes mellitus without complications: Secondary | ICD-10-CM | POA: Insufficient documentation

## 2016-11-28 DIAGNOSIS — M545 Low back pain: Secondary | ICD-10-CM | POA: Diagnosis present

## 2016-11-28 MED ORDER — DIAZEPAM 5 MG PO TABS
10.0000 mg | ORAL_TABLET | Freq: Once | ORAL | Status: AC
  Administered 2016-11-28: 10 mg via ORAL
  Filled 2016-11-28: qty 2

## 2016-11-28 MED ORDER — MORPHINE SULFATE 15 MG PO TABS: 15.0000 mg | ORAL_TABLET | Freq: Four times a day (QID) | ORAL | 0 refills | Status: DC | PRN

## 2016-11-28 MED ORDER — HYDROMORPHONE HCL 2 MG/ML IJ SOLN
2.0000 mg | Freq: Once | INTRAMUSCULAR | Status: AC
  Administered 2016-11-28: 2 mg via INTRAMUSCULAR
  Filled 2016-11-28: qty 1

## 2016-11-28 MED ORDER — ONDANSETRON 8 MG PO TBDP
8.0000 mg | ORAL_TABLET | Freq: Once | ORAL | Status: AC
  Administered 2016-11-28: 8 mg via ORAL
  Filled 2016-11-28: qty 1

## 2016-11-28 MED ORDER — DIAZEPAM 10 MG PO TABS: 10.0000 mg | ORAL_TABLET | Freq: Three times a day (TID) | ORAL | 0 refills | Status: DC | PRN

## 2016-11-28 NOTE — ED Provider Notes (Signed)
[facility] DEPT Provider Note   CSN: [id number] Arrival date & time: 11/28/16  1851     History   Chief Complaint Chief Complaint  Patient presents with  . Hip Pain    HPI Jorge [name] is a 65 y.o. male.  Patient reports pain, lower back and right leg, with numbness in the right leg, unable to be controlled with current medicines which she is taking. His had multiple visits recently and is scheduled for a decompression surgery for L4-5 radiculopathy, recently diagnosed on MRI imaging. Completed a course of prednisone about 2 weeks ago. He is taking stool softeners along with Naprosyn and Percocet. Is been no fever, vomiting, or weakness. There are no other known modifying factors.   HPI  Past Medical History:  Diagnosis Date  . Diabetes mellitus without complication ([place])   . Hypertension     There are no active problems to display for this patient.   History reviewed. No pertinent surgical history.     Home Medications    Prior to Admission medications   Medication Sig Start Date End Date Taking? Authorizing Provider  amLODipine (NORVASC) 5 MG tablet Take 5 mg by mouth daily. 07/16/16  Yes Historical Provider, MD  docusate sodium (COLACE) 100 MG capsule Take 1 capsule (100 mg total) by mouth every 12 (twelve) hours. 11/25/16  Yes Charlesetta Shanks, MD  hydrochlorothiazide (HYDRODIURIL) 12.5 MG tablet Take 12.5 mg by mouth daily. 08/03/16  Yes Historical Provider, MD  naproxen (NAPROSYN) 375 MG tablet Take 1 tablet (375 mg total) by mouth 2 (two) times daily. 11/25/16  Yes Charlesetta Shanks, MD  diazepam (VALIUM) 10 MG tablet Take 1 tablet (10 mg total) by mouth every 8 (eight) hours as needed (muscle spasm). 11/28/16   Daleen Bo, MD  esomeprazole (NEXIUM) 40 MG capsule Take 1 capsule (40 mg total) by mouth daily. Patient not taking: Reported on 11/28/2016 08/09/16   Drenda Freeze, MD  morphine (MSIR) 15 MG tablet Take 1 tablet (15 mg total) by mouth every 6 (six)  hours as needed for severe pain. 11/28/16   Daleen Bo, MD  orphenadrine (NORFLEX) 100 MG tablet Take 1 tablet (100 mg total) by mouth 2 (two) times daily. Patient not taking: Reported on 11/28/2016 11/25/16   Charlesetta Shanks, MD  senna-docusate (SENOKOT-S) 8.6-50 MG tablet Take 1 tablet by mouth daily. Patient not taking: Reported on 11/28/2016 11/15/16   Margette Fast, MD  sucralfate (CARAFATE) 1 g tablet Take 1 tablet (1 g total) by mouth 4 (four) times daily -  with meals and at bedtime. Patient not taking: Reported on 11/28/2016 08/09/16   Drenda Freeze, MD    Family History No family history on file.  Social History Social History  Substance Use Topics  . Smoking status: Never Smoker  . Smokeless tobacco: Never Used  . Alcohol use No     Allergies   Patient has no known allergies.   Review of Systems Review of Systems  All other systems reviewed and are negative.    Physical Exam Updated Vital Signs BP 154/95 (BP Location: Right Arm)   Pulse 76   Temp 97.5 F (36.4 C) (Oral)   Resp 17   SpO2 94%   Physical Exam  Constitutional: He is oriented to person, place, and time. He appears well-developed and well-nourished.  HENT:  Head: Normocephalic and atraumatic.  Right Ear: External ear normal.  Left Ear: External ear normal.  Eyes: Conjunctivae and EOM are normal. Pupils  are equal, round, and reactive to light.  Neck: Normal range of motion and phonation normal. Neck supple.  Cardiovascular: Normal rate.   Pulmonary/Chest: Effort normal. He exhibits no bony tenderness.  Musculoskeletal: Normal range of motion.  No deformity of the arms and legs bilaterally.  Neurological: He is alert and oriented to person, place, and time. No cranial nerve deficit or sensory deficit. He exhibits normal muscle tone. Coordination normal.  Decreased light touch sensation right leg.  Skin: Skin is warm, dry and intact.  Psychiatric: He has a normal mood and affect. His behavior  is normal. Judgment and thought content normal.  Nursing note and vitals reviewed.    ED Treatments / Results  Labs (all labs ordered are listed, but only abnormal results are displayed) Labs Reviewed - No data to display  EKG  EKG Interpretation None       Radiology No results found.  Procedures Procedures (including critical care time)  Medications Ordered in ED Medications  HYDROmorphone (DILAUDID) injection 2 mg (2 mg Intramuscular Given 11/28/16 2057)  ondansetron (ZOFRAN-ODT) disintegrating tablet 8 mg (8 mg Oral Given 11/28/16 2058)  diazepam (VALIUM) tablet 10 mg (10 mg Oral Given 11/28/16 2058)     Initial Impression / Assessment and Plan / ED Course  I have reviewed the triage vital signs and the nursing notes.  Pertinent labs & imaging results that were available during my care of the patient were reviewed by me and considered in my medical decision making (see chart for details).  Clinical Course     Medications  HYDROmorphone (DILAUDID) injection 2 mg (2 mg Intramuscular Given 11/28/16 2057)  ondansetron (ZOFRAN-ODT) disintegrating tablet 8 mg (8 mg Oral Given 11/28/16 2058)  diazepam (VALIUM) tablet 10 mg (10 mg Oral Given 11/28/16 2058)    Patient Vitals for the past 24 hrs:  BP Temp Temp src Pulse Resp SpO2  11/28/16 2058 154/95 - - 76 17 94 %  11/28/16 1859 162/84 97.5 F (36.4 C) Oral 74 18 97 %    10:42 PM Reevaluation with update and discussion. After initial assessment and treatment, an updated evaluation reveals Patient reports he is feeling better. Findings discussed with the patient, and his son, all questions answered.Daleen Bo L    Final Clinical Impressions(s) / ED Diagnoses   Final diagnoses:  Lumbar radiculopathy    Lumbar radiculopathy, currently under treatment, and has received a course of prednisone. Doubt spinal myelopathy, new back injury or metabolic instability.  Nursing Notes Reviewed/ Care Coordinated Applicable  Imaging Reviewed Interpretation of Laboratory Data incorporated into ED treatment  The patient appears reasonably screened and/or stabilized for discharge and I doubt any other medical condition or other [facility] requiring further screening, evaluation, or treatment in the ED at this time prior to discharge.  Plan: Home Medications- stop Percocet, for now.; Home Treatments- rest, heat; return here if the recommended treatment, does not improve the symptoms; Recommended follow up- neurosurgery this week as scheduled   New Prescriptions New Prescriptions   DIAZEPAM (VALIUM) 10 MG TABLET    Take 1 tablet (10 mg total) by mouth every 8 (eight) hours as needed (muscle spasm).   MORPHINE (MSIR) 15 MG TABLET    Take 1 tablet (15 mg total) by mouth every 6 (six) hours as needed for severe pain.     Daleen Bo, MD 11/28/16 [id number]

## 2016-11-28 NOTE — ED Triage Notes (Signed)
Per EMS pt c/o right hip pain, is cheduled for surgery on Thursday, taking oxycodone (last dose 30 min ago) without relief

## 2016-11-28 NOTE — Discharge Instructions (Signed)
Follow-up with your neurosurgeon, this week, as scheduled.

## 2016-11-28 NOTE — ED Notes (Signed)
Bed: [id number] Expected date:  Expected time:  Means of arrival:  Comments: Hip pain

## 2016-12-03 ENCOUNTER — Other Ambulatory Visit: Payer: Self-pay | Admitting: Neurological Surgery

## 2016-12-04 DIAGNOSIS — I10 Essential (primary) hypertension: Secondary | ICD-10-CM | POA: Diagnosis not present

## 2016-12-04 DIAGNOSIS — M5416 Radiculopathy, lumbar region: Secondary | ICD-10-CM | POA: Insufficient documentation

## 2016-12-04 DIAGNOSIS — Z79899 Other long term (current) drug therapy: Secondary | ICD-10-CM | POA: Diagnosis not present

## 2016-12-04 DIAGNOSIS — E119 Type 2 diabetes mellitus without complications: Secondary | ICD-10-CM | POA: Diagnosis not present

## 2016-12-04 DIAGNOSIS — M25551 Pain in right hip: Secondary | ICD-10-CM | POA: Diagnosis present

## 2016-12-05 ENCOUNTER — Encounter (HOSPITAL_COMMUNITY): Payer: Self-pay

## 2016-12-05 ENCOUNTER — Emergency Department (HOSPITAL_COMMUNITY)
Admission: EM | Admit: 2016-12-05 | Discharge: 2016-12-05 | Disposition: A | Discharge status: Home / Self Care | Payer: Medicaid Other | Attending: Emergency Medicine | Admitting: Emergency Medicine

## 2016-12-05 DIAGNOSIS — M5416 Radiculopathy, lumbar region: Secondary | ICD-10-CM

## 2016-12-05 LAB — CBC WITH DIFFERENTIAL/PLATELET
BASOS ABS: 0 10*3/uL (ref 0.0–0.1)
BASOS PCT: 0 %
Eosinophils Absolute: 0.1 10*3/uL (ref 0.0–0.7)
Eosinophils Relative: 1 %
HEMATOCRIT: 42.9 % (ref 39.0–52.0)
HEMOGLOBIN: 15.2 g/dL (ref 13.0–17.0)
Lymphocytes Relative: 14 %
Lymphs Abs: 1.2 10*3/uL (ref 0.7–4.0)
MCH: 31 pg (ref 26.0–34.0)
MCHC: 35.4 g/dL (ref 30.0–36.0)
MCV: 87.4 fL (ref 78.0–100.0)
MONO ABS: 0.5 10*3/uL (ref 0.1–1.0)
Monocytes Relative: 7 %
NEUTROS PCT: 78 %
Neutro Abs: 6.3 10*3/uL (ref 1.7–7.7)
Platelets: 141 10*3/uL — ABNORMAL LOW (ref 150–400)
RBC: 4.91 MIL/uL (ref 4.22–5.81)
RDW: 11.8 % (ref 11.5–15.5)
WBC: 8.1 10*3/uL (ref 4.0–10.5)

## 2016-12-05 LAB — BASIC METABOLIC PANEL
ANION GAP: 10 (ref 5–15)
BUN: 7 mg/dL (ref 6–20)
CALCIUM: 9.3 mg/dL (ref 8.9–10.3)
CO2: 28 mmol/L (ref 22–32)
Chloride: 98 mmol/L — ABNORMAL LOW (ref 101–111)
Creatinine, Ser: 0.98 mg/dL (ref 0.61–1.24)
GFR calc non Af Amer: >60 mL/min (ref >60)
Glucose, Bld: 125 mg/dL — ABNORMAL HIGH (ref 65–99)
Potassium: 3.8 mmol/L (ref 3.5–5.1)
Sodium: 136 mmol/L (ref 135–145)

## 2016-12-05 LAB — URINALYSIS, ROUTINE W REFLEX MICROSCOPIC
Bilirubin Urine: NEGATIVE
Glucose, UA: NEGATIVE mg/dL
Hgb urine dipstick: NEGATIVE
Ketones, ur: NEGATIVE mg/dL
Leukocytes, UA: NEGATIVE
NITRITE: NEGATIVE
Protein, ur: NEGATIVE mg/dL
SPECIFIC GRAVITY, URINE: 1.004 — AB (ref 1.005–1.030)
pH: 7 (ref 5.0–8.0)

## 2016-12-05 MED ORDER — ONDANSETRON HCL 4 MG/2ML IJ SOLN
4.0000 mg | Freq: Once | INTRAMUSCULAR | Status: AC
  Administered 2016-12-05: 4 mg via INTRAVENOUS
  Filled 2016-12-05: qty 2

## 2016-12-05 MED ORDER — MORPHINE SULFATE (PF) 4 MG/ML IV SOLN
4.0000 mg | Freq: Once | INTRAVENOUS | Status: AC
  Administered 2016-12-05: 4 mg via INTRAVENOUS
  Filled 2016-12-05: qty 1

## 2016-12-05 NOTE — ED Notes (Signed)
Nurse will get labs 

## 2016-12-05 NOTE — ED Provider Notes (Signed)
[facility] DEPT Provider Note   CSN: [id number] Arrival date & time: 12/04/16  2357 By signing my name below, I, Jorge [name], attest that this documentation has been prepared under the direction and in the presence of Jorge Greek, MD. Electronically Signed: Doran [name], ED Scribe. 12/05/16. 2:12 AM.  History   Chief Complaint Chief Complaint  Patient presents with  . Hip Pain  . Dysuria   The history is provided by the patient. No language interpreter was used.   HPI Comments: [facility] [name] is a 65 y.o. male who presents to the Emergency Department with a PMHx of DM and HTN complaining of right hip pain that has been ongoing for the past couple of weeks but worsened 2 days ago. Pt also reports difficulty urinating. Son states his pain is worse with movement. Pt has not tried any OTC medication for pain. Pt denies any fevers, chills, CP, SOB, N/V/D, or any other symptoms at this time.  Past Medical History:  Diagnosis Date  . Diabetes mellitus without complication ([place])   . Hypertension    There are no active problems to display for this patient.  History reviewed. No pertinent surgical history.  Home Medications    Prior to Admission medications   Medication Sig Start Date End Date Taking? Authorizing Provider  amLODipine (NORVASC) 5 MG tablet Take 5 mg by mouth daily. 07/16/16   Historical Provider, MD  diazepam (VALIUM) 10 MG tablet Take 1 tablet (10 mg total) by mouth every 8 (eight) hours as needed (muscle spasm). 11/28/16   Daleen Bo, MD  docusate sodium (COLACE) 100 MG capsule Take 1 capsule (100 mg total) by mouth every 12 (twelve) hours. 11/25/16   Charlesetta Shanks, MD  esomeprazole (NEXIUM) 40 MG capsule Take 1 capsule (40 mg total) by mouth daily. Patient not taking: Reported on 11/28/2016 08/09/16   Drenda Freeze, MD  hydrochlorothiazide (HYDRODIURIL) 12.5 MG tablet Take 12.5 mg by mouth daily. 08/03/16   Historical Provider, MD  morphine (MSIR)  15 MG tablet Take 1 tablet (15 mg total) by mouth every 6 (six) hours as needed for severe pain. 11/28/16   Daleen Bo, MD  naproxen (NAPROSYN) 375 MG tablet Take 1 tablet (375 mg total) by mouth 2 (two) times daily. 11/25/16   Charlesetta Shanks, MD  orphenadrine (NORFLEX) 100 MG tablet Take 1 tablet (100 mg total) by mouth 2 (two) times daily. Patient not taking: Reported on 11/28/2016 11/25/16   Charlesetta Shanks, MD  senna-docusate (SENOKOT-S) 8.6-50 MG tablet Take 1 tablet by mouth daily. Patient not taking: Reported on 11/28/2016 11/15/16   Margette Fast, MD  sucralfate (CARAFATE) 1 g tablet Take 1 tablet (1 g total) by mouth 4 (four) times daily -  with meals and at bedtime. Patient not taking: Reported on 11/28/2016 08/09/16   Drenda Freeze, MD    Family History History reviewed. No pertinent family history.  Social History Social History  Substance Use Topics  . Smoking status: Never Smoker  . Smokeless tobacco: Never Used  . Alcohol use No     Allergies   Patient has no known allergies.   Review of Systems Review of Systems  Constitutional: Negative for chills and fever.  Respiratory: Negative for shortness of breath.   Cardiovascular: Negative for chest pain.  Gastrointestinal: Negative for diarrhea, nausea and vomiting.  Genitourinary: Positive for difficulty urinating.  Musculoskeletal: Positive for arthralgias.  All other systems reviewed and are negative.  Physical Exam Updated Vital Signs  BP 119/70 (BP Location: Right Arm)   Pulse 79   Temp 99.4 F (37.4 C) (Oral)   Resp 18   Ht 5\' 2"  (1.575 m)   Wt 185 lb (83.9 kg)   SpO2 92%   BMI 33.84 kg/m   Physical Exam  Constitutional: He is oriented to person, place, and time. He appears well-developed and well-nourished. No distress.  HENT:  Head: Normocephalic and atraumatic.  Right Ear: Hearing normal.  Left Ear: Hearing normal.  Nose: Nose normal.  Mouth/Throat: Oropharynx is clear and moist and mucous  membranes are normal.  Eyes: Conjunctivae and EOM are normal. Pupils are equal, round, and reactive to light.  Neck: Normal range of motion. Neck supple.  Cardiovascular: Regular rhythm, S1 normal and S2 normal.  Exam reveals no gallop and no friction rub.   No murmur heard. Pulmonary/Chest: Effort normal and breath sounds normal. No respiratory distress. He exhibits no tenderness.  Abdominal: Soft. Normal appearance and bowel sounds are normal. There is no hepatosplenomegaly. There is no tenderness. There is no rebound, no guarding, no tenderness at McBurney's point and negative Murphy's sign. No hernia.  Musculoskeletal:  Right lower lumbar paraspinal tenderness. Positive right SLR. Normal patellar reflex. Normal strength and sensation. Negative foot drop  Neurological: He is alert and oriented to person, place, and time. He has normal strength. No cranial nerve deficit or sensory deficit. Coordination normal. GCS eye subscore is 4. GCS verbal subscore is 5. GCS motor subscore is 6.  Skin: Skin is warm, dry and intact. No rash noted. No cyanosis.  Psychiatric: He has a normal mood and affect. His speech is normal and behavior is normal. Thought content normal.  Nursing note and vitals reviewed.  ED Treatments / Results  DIAGNOSTIC STUDIES: Oxygen Saturation is 95% on room air, normal by my interpretation.    COORDINATION OF CARE: 2:12 AM Discussed treatment plan with pt at bedside and pt agreed to plan.  Labs (all labs ordered are listed, but only abnormal results are displayed) Labs Reviewed  URINALYSIS, ROUTINE W REFLEX MICROSCOPIC - Abnormal; Notable for the following:       Result Value   Color, Urine STRAW (*)    Specific Gravity, Urine 1.004 (*)    All other components within normal limits  CBC WITH DIFFERENTIAL/PLATELET - Abnormal; Notable for the following:    Platelets 141 (*)    All other components within normal limits  BASIC METABOLIC PANEL - Abnormal; Notable for the  following:    Chloride 98 (*)    Glucose, Bld 125 (*)    All other components within normal limits    EKG  EKG Interpretation [name]       Radiology No results found.  Procedures Procedures (including critical care time)  Medications Ordered in ED Medications  morphine 4 MG/ML injection 4 mg (4 mg Intravenous Given 12/05/16 0302)  ondansetron (ZOFRAN) injection 4 mg (4 mg Intravenous Given 12/05/16 0259)     Initial Impression / Assessment and Plan / ED Course  I have reviewed the triage vital signs and the nursing notes.  Pertinent labs & imaging results that were available during my care of the patient were reviewed by me and considered in my medical decision making (see chart for details).  Clinical Course    Patient presents primarily for pain in his lower back and right hip area, radiating down the leg. Patient has previous diagnosis of lumbar radiculopathy. He is scheduled for lumbar surgery with Dr. Cyndy Freeze  later this month. Patient reported that he was having urinary hasn't since he and decreased volume at home, however, he was able to urinate without difficulty here in the ER. No urinary retention, no incontinence. Patient has normal strength in both lower extremities. He does not have a foot drop. No saddle anesthesia. Patient has normal reflexes. Does not have any sign of acute neurosurgical emergency. He is feeling much improvement after IV analgesia. Urinalysis was normal. Kidney function and lab work unremarkable. Patient appropriate for discharge, follow-up with Dr. Cyndy Freeze.  Final Clinical Impressions(s) / ED Diagnoses   Final diagnoses:  Lumbar radiculopathy    New Prescriptions New Prescriptions   No medications on file   I personally performed the services described in this documentation, which was scribed in my presence. The recorded information has been reviewed and is accurate.    Jorge Greek, MD 12/05/16 [id number]

## 2016-12-05 NOTE — ED Notes (Signed)
Pt placed on 2L of oxygen due to oxygen sats dropping from morphine administration.

## 2016-12-05 NOTE — ED Notes (Signed)
Pt is having pain to the right hip that radiates down the right leg.

## 2016-12-05 NOTE — ED Triage Notes (Signed)
Pt reports urinary problems stating "It takes a long time to pee and it's stop and go" Pt reports back pain radiating down into the right leg that he has been seen here for multiple times. Denies numbness or neuro sx. Denies chills or fever.

## 2016-12-10 ENCOUNTER — Encounter (HOSPITAL_COMMUNITY): Payer: Self-pay | Admitting: Emergency Medicine

## 2016-12-10 ENCOUNTER — Emergency Department (HOSPITAL_COMMUNITY): Payer: Medicaid Other

## 2016-12-10 ENCOUNTER — Emergency Department (HOSPITAL_COMMUNITY)
Admission: EM | Admit: 2016-12-10 | Discharge: 2016-12-10 | Disposition: A | Discharge status: Home / Self Care | Payer: Medicaid Other | Attending: Emergency Medicine | Admitting: Emergency Medicine

## 2016-12-10 DIAGNOSIS — I10 Essential (primary) hypertension: Secondary | ICD-10-CM | POA: Insufficient documentation

## 2016-12-10 DIAGNOSIS — R1030 Lower abdominal pain, unspecified: Secondary | ICD-10-CM | POA: Diagnosis present

## 2016-12-10 DIAGNOSIS — K5900 Constipation, unspecified: Secondary | ICD-10-CM | POA: Insufficient documentation

## 2016-12-10 DIAGNOSIS — E119 Type 2 diabetes mellitus without complications: Secondary | ICD-10-CM | POA: Insufficient documentation

## 2016-12-10 LAB — URINALYSIS, ROUTINE W REFLEX MICROSCOPIC
Bilirubin Urine: NEGATIVE
Glucose, UA: NEGATIVE mg/dL
HGB URINE DIPSTICK: NEGATIVE
Ketones, ur: NEGATIVE mg/dL
Leukocytes, UA: NEGATIVE
Nitrite: NEGATIVE
PH: 8 (ref 5.0–8.0)
Protein, ur: NEGATIVE mg/dL
SPECIFIC GRAVITY, URINE: 1.005 (ref 1.005–1.030)

## 2016-12-10 LAB — COMPREHENSIVE METABOLIC PANEL
ALBUMIN: 3.9 g/dL (ref 3.5–5.0)
ALT: 21 U/L (ref 17–63)
ANION GAP: 10 (ref 5–15)
AST: 23 U/L (ref 15–41)
Alkaline Phosphatase: 88 U/L (ref 38–126)
BUN: 5 mg/dL — ABNORMAL LOW (ref 6–20)
CHLORIDE: 102 mmol/L (ref 101–111)
CO2: 25 mmol/L (ref 22–32)
Calcium: 9.4 mg/dL (ref 8.9–10.3)
Creatinine, Ser: 0.97 mg/dL (ref 0.61–1.24)
GFR calc non Af Amer: >60 mL/min (ref >60)
GLUCOSE: 134 mg/dL — AB (ref 65–99)
POTASSIUM: 3.7 mmol/L (ref 3.5–5.1)
SODIUM: 137 mmol/L (ref 135–145)
Total Bilirubin: 0.6 mg/dL (ref 0.3–1.2)
Total Protein: 7.2 g/dL (ref 6.5–8.1)

## 2016-12-10 LAB — CBC
HEMATOCRIT: 45.2 % (ref 39.0–52.0)
HEMOGLOBIN: 16.3 g/dL (ref 13.0–17.0)
MCH: 30.5 pg (ref 26.0–34.0)
MCHC: 36.1 g/dL — AB (ref 30.0–36.0)
MCV: 84.5 fL (ref 78.0–100.0)
Platelets: 211 10*3/uL (ref 150–400)
RBC: 5.35 MIL/uL (ref 4.22–5.81)
RDW: 11.7 % (ref 11.5–15.5)
WBC: 5.9 10*3/uL (ref 4.0–10.5)

## 2016-12-10 LAB — LIPASE, BLOOD: LIPASE: 14 U/L (ref 11–51)

## 2016-12-10 MED ORDER — POLYETHYLENE GLYCOL 3350 17 G PO PACK: 17.0000 g | PACK | Freq: Every day | ORAL | 0 refills | Status: DC

## 2016-12-10 MED ORDER — DICYCLOMINE HCL 10 MG PO CAPS
10.0000 mg | ORAL_CAPSULE | Freq: Once | ORAL | Status: AC
  Administered 2016-12-10: 10 mg via ORAL
  Filled 2016-12-10: qty 1

## 2016-12-10 MED ORDER — FLEET ENEMA 7-19 GM/118ML RE ENEM
1.0000 | ENEMA | Freq: Once | RECTAL | Status: AC
  Administered 2016-12-10: 1 via RECTAL
  Filled 2016-12-10: qty 1

## 2016-12-10 MED ORDER — IOPAMIDOL (ISOVUE-300) INJECTION 61%
INTRAVENOUS | Status: AC
  Administered 2016-12-10: 100 mL
  Filled 2016-12-10: qty 100

## 2016-12-10 NOTE — ED Triage Notes (Signed)
Patient reports mid abdominal pain onset yesterday morning with constipation ,denies emesis or diarrhea , no fever or chills .

## 2016-12-10 NOTE — ED Notes (Signed)
Pt going to xray  

## 2016-12-10 NOTE — ED Notes (Signed)
Patient transported to CT 

## 2016-12-10 NOTE — Discharge Instructions (Signed)
YOu need to take miralax daily until stools are soft.  Follow-up with your primary doctor for refills of your medication.

## 2016-12-10 NOTE — ED Provider Notes (Signed)
[facility] DEPT Provider Note   CSN: [id number] Arrival date & time: 12/10/16  0255   By signing my name below, I, Delton Prairie, attest that this documentation has been prepared under the direction and in the presence of Merryl Hacker, MD  Electronically Signed: Delton Prairie, ED Scribe. 12/10/16. 3:42 AM.   History   Chief Complaint Chief Complaint  Patient presents with  . Abdominal Pain  . Constipation   The history is provided by the patient and a relative. No language interpreter was used.   HPI Comments:  [facility] [name] is a 65 y.o. male, with a hx of DM and HTN, who presents to the Emergency Department complaining of sudden onset, "9-10/10" lower abdominal pain x yesterday. Associated symptoms includes constipation, dysuria and feeling hot x 3 days. He has not checked his temperature. Pt's last bowel movement was in the AM yesterday which was hard. He takes narcotic pain medications for back pain and is on senna-docusate. Pt denies vomiting, any other associated symptoms and modifying factors at this time.   Family is translating.  Past Medical History:  Diagnosis Date  . Diabetes mellitus without complication ([place])   . Hypertension     There are no active problems to display for this patient.   History reviewed. No pertinent surgical history.   Home Medications    Prior to Admission medications   Medication Sig Start Date End Date Taking? Authorizing Provider  amLODipine (NORVASC) 5 MG tablet Take 5 mg by mouth daily. 07/16/16  Yes Historical Provider, MD  diazepam (VALIUM) 10 MG tablet Take 1 tablet (10 mg total) by mouth every 8 (eight) hours as needed (muscle spasm). 11/28/16  Yes Daleen Bo, MD  docusate sodium (COLACE) 100 MG capsule Take 1 capsule (100 mg total) by mouth every 12 (twelve) hours. 11/25/16  Yes Charlesetta Shanks, MD  hydrochlorothiazide (HYDRODIURIL) 12.5 MG tablet Take 12.5 mg by mouth daily. 08/03/16  Yes Historical Provider, MD    morphine (MSIR) 15 MG tablet Take 1 tablet (15 mg total) by mouth every 6 (six) hours as needed for severe pain. 11/28/16  Yes Daleen Bo, MD  naproxen (NAPROSYN) 375 MG tablet Take 1 tablet (375 mg total) by mouth 2 (two) times daily. 11/25/16  Yes Charlesetta Shanks, MD  esomeprazole (NEXIUM) 40 MG capsule Take 1 capsule (40 mg total) by mouth daily. Patient not taking: Reported on 12/10/2016 08/09/16   Drenda Freeze, MD  orphenadrine (NORFLEX) 100 MG tablet Take 1 tablet (100 mg total) by mouth 2 (two) times daily. Patient not taking: Reported on 12/10/2016 11/25/16   Charlesetta Shanks, MD  polyethylene glycol [facility]) packet Take 17 g by mouth daily. 12/10/16   Merryl Hacker, MD  senna-docusate (SENOKOT-S) 8.6-50 MG tablet Take 1 tablet by mouth daily. Patient not taking: Reported on 12/10/2016 11/15/16   Margette Fast, MD  sucralfate (CARAFATE) 1 g tablet Take 1 tablet (1 g total) by mouth 4 (four) times daily -  with meals and at bedtime. Patient not taking: Reported on 12/10/2016 08/09/16   Drenda Freeze, MD    Family History No family history on file.  Social History Social History  Substance Use Topics  . Smoking status: Never Smoker  . Smokeless tobacco: Never Used  . Alcohol use No     Allergies   Patient has no known allergies.   Review of Systems Review of Systems  Constitutional: Negative for fever.  Gastrointestinal: Positive for abdominal pain and constipation.  Negative for vomiting.  Genitourinary: Positive for dysuria.  Musculoskeletal: Positive for back pain.  All other systems reviewed and are negative.  Physical Exam Updated Vital Signs BP 134/86   Pulse 66   Temp 98.7 F (37.1 C) (Oral)   Resp 22   SpO2 95%   Physical Exam  Constitutional: He is oriented to person, place, and time.  Elderly, no acute distress  HENT:  Head: Normocephalic and atraumatic.  Eyes: Pupils are equal, round, and reactive to light.  Cardiovascular: Normal rate,  regular rhythm and normal heart sounds.   Pulmonary/Chest: Effort normal and breath sounds normal. No respiratory distress. He has no wheezes.  Abdominal: Soft. Bowel sounds are normal. There is tenderness. There is no rebound and no guarding.  Diffuse TTP without rebound or guarding  Musculoskeletal: He exhibits no edema.  Neurological: He is alert and oriented to person, place, and time.  Skin: Skin is warm and dry.  Psychiatric: He has a normal mood and affect.  Nursing note and vitals reviewed.    ED Treatments / Results  DIAGNOSTIC STUDIES:  Oxygen Saturation is 97% on RA, normal by my interpretation.    COORDINATION OF CARE:  3:38 AM Discussed treatment plan with pt at bedside and pt agreed to plan.  Labs (all labs ordered are listed, but only abnormal results are displayed) Labs Reviewed  COMPREHENSIVE METABOLIC PANEL - Abnormal; Notable for the following:       Result Value   Glucose, Bld 134 (*)    BUN 5 (*)    All other components within normal limits  CBC - Abnormal; Notable for the following:    MCHC 36.1 (*)    All other components within normal limits  LIPASE, BLOOD  URINALYSIS, ROUTINE W REFLEX MICROSCOPIC    EKG  EKG Interpretation None       Radiology Dg Abdomen 1 View  Result Date: 12/10/2016 CLINICAL DATA:  65 y/o  M; 2-3 days of constipation. EXAM: ABDOMEN - 1 VIEW COMPARISON:  None. FINDINGS: Normal bowel gas pattern. Small volume of stool within the colon. No radiopaque stone disease identified. IMPRESSION: Negative. Electronically Signed   By: Kristine Garbe M.D.   On: 12/10/2016 04:10   Ct Abdomen Pelvis W Contrast  Result Date: 12/10/2016 CLINICAL DATA:  65 y/o  M; sudden onset lower abdominal pain. EXAM: CT ABDOMEN AND PELVIS WITH CONTRAST TECHNIQUE: Multidetector CT imaging of the abdomen and pelvis was performed using the standard protocol following bolus administration of intravenous contrast. CONTRAST:  117mL ISOVUE-300  IOPAMIDOL (ISOVUE-300) INJECTION 61% COMPARISON:  None. FINDINGS: Lower chest: No acute abnormality. Hepatobiliary: Segment 2 well-circumscribed subcentimeter lucency, probably a cyst. Focal fat along falciform ligament. No other focal liver lesion. Normal gallbladder. No intra or extrahepatic biliary ductal dilatation. Pancreas: Unremarkable. No pancreatic ductal dilatation or surrounding inflammatory changes. Spleen: Normal in size without focal abnormality. Adrenals/Urinary Tract: Adrenal glands are unremarkable. Kidneys are normal, without renal calculi, focal lesion, or hydronephrosis. Bladder is unremarkable. Stomach/Bowel: Stomach is within normal limits. Appendix appears normal. No evidence of bowel wall thickening, distention, or inflammatory changes. Vascular/Lymphatic: Aortic atherosclerosis with mild calcification. No enlarged abdominal or pelvic lymph nodes. Reproductive: Small lucencies within the central prostate (series 2, image 89), possibly cysts versus prostatitis. Other: No abdominal wall hernia or abnormality. No abdominopelvic ascites. Musculoskeletal: No acute or significant osseous findings. Mild lumbar degenerative changes with marginal osteophytes. IMPRESSION: 1. Small lucencies within the central prostate, possibly cysts versus prostatitis. 2. Mild aortic atherosclerosis.  3. Otherwise unremarkable CT of abdomen and pelvis. Electronically Signed   By: Kristine Garbe M.D.   On: 12/10/2016 05:46    Procedures Procedures (including critical care time)  Medications Ordered in ED Medications  dicyclomine (BENTYL) capsule 10 mg (10 mg Oral Given 12/10/16 0357)  iopamidol (ISOVUE-300) 61 % injection (100 mLs  Contrast Given 12/10/16 0507)  sodium phosphate (FLEET) 7-19 GM/118ML enema 1 enema (1 enema Rectal Given 12/10/16 [id number])     Initial Impression / Assessment and Plan / ED Course  I have reviewed the triage vital signs and the nursing notes.  Pertinent labs & imaging  results that were available during my care of the patient were reviewed by me and considered in my medical decision making (see chart for details).  Clinical Course     Patient presents with abdominal pain. Recent history of back strain and on narcotic pain medication. Reports constipation. Diffusely tender to palpation without rebound or guarding. Otherwise nontoxic. Lab work largely reassuring. X-ray without significant constipation. Will obtain CT scan given patient's age and tenderness. CT scan negative with exception of moderate constipation. Patient given an enema with good result. Reports improvement of pain. Will place on MiraLAX daily.  After history, exam, and medical workup I feel the patient has been appropriately medically screened and is safe for discharge home. Pertinent diagnoses were discussed with the patient. Patient was given return precautions.   Final Clinical Impressions(s) / ED Diagnoses   Final diagnoses:  Lower abdominal pain  Constipation, unspecified constipation type    New Prescriptions Discharge Medication List as of 12/10/2016  6:58 AM    START taking these medications   Details  polyethylene glycol (MIRALAX) packet Take 17 g by mouth daily., Starting Thu 12/10/2016, Print       I personally performed the services described in this documentation, which was scribed in my presence. The recorded information has been reviewed and is accurate.     Merryl Hacker, MD 12/10/16 [phone]

## 2016-12-10 NOTE — ED Notes (Signed)
Pt had a large bowel movement after the enema

## 2016-12-12 ENCOUNTER — Encounter (HOSPITAL_COMMUNITY): Payer: Self-pay | Admitting: *Deleted

## 2016-12-12 DIAGNOSIS — G8929 Other chronic pain: Secondary | ICD-10-CM | POA: Insufficient documentation

## 2016-12-12 DIAGNOSIS — E119 Type 2 diabetes mellitus without complications: Secondary | ICD-10-CM | POA: Insufficient documentation

## 2016-12-12 DIAGNOSIS — M545 Low back pain: Secondary | ICD-10-CM | POA: Diagnosis present

## 2016-12-12 DIAGNOSIS — I10 Essential (primary) hypertension: Secondary | ICD-10-CM | POA: Insufficient documentation

## 2016-12-12 DIAGNOSIS — M5441 Lumbago with sciatica, right side: Secondary | ICD-10-CM | POA: Diagnosis not present

## 2016-12-12 NOTE — ED Triage Notes (Signed)
The pt is here because he raN Jorge [name] 1 MG.  HE TAKES THE MED FOR RT HIP AND LEG PAIN.  HE IS SCHEDULED FOR RT HIP SURGERY DEC 29TH  HE NEEDS MORE PAIN MED

## 2016-12-13 ENCOUNTER — Emergency Department (HOSPITAL_COMMUNITY)
Admission: EM | Admit: 2016-12-13 | Discharge: 2016-12-13 | Disposition: A | Discharge status: Home / Self Care | Payer: Medicaid Other | Attending: Emergency Medicine | Admitting: Emergency Medicine

## 2016-12-13 DIAGNOSIS — M5441 Lumbago with sciatica, right side: Secondary | ICD-10-CM

## 2016-12-13 DIAGNOSIS — G8929 Other chronic pain: Secondary | ICD-10-CM

## 2016-12-13 MED ORDER — MORPHINE SULFATE 15 MG PO TABS: 15.0000 mg | ORAL_TABLET | Freq: Four times a day (QID) | ORAL | 0 refills | Status: DC | PRN

## 2016-12-13 NOTE — Discharge Instructions (Signed)
Follow up with Dr. Cyndy Freeze for pain until your surgery

## 2016-12-13 NOTE — ED Notes (Signed)
Pt is here with complaint of right hip and leg pain.  States he had a prescription for morphine that has ran out and states he is here because he has no more pain medication

## 2016-12-13 NOTE — ED Provider Notes (Signed)
[facility] DEPT Provider Note   CSN: [id number] Arrival date & time: 12/12/16  2341     History   Chief Complaint Chief Complaint  Patient presents with  . Hip Pain    HPI Jorge [name] is a 65 y.o. male who presents with low back pain radiating to the right leg. Son is at bedside and interprets. PMH significant for herniated disc at L3-L4 and L4-L5. He is scheduled for surgery on 12/29 with Dr. Cyndy Freeze. He was last seen in the ED on 12/9 for the same. He received 1 week supply of morphine IR 15mg  on 12/2 and he is out of this medicine. Denies any new issues. He has not called Dr. Hewitt Shorts office for pain management. Denies fever, saddle anesthesia, inability to walk, bowel/bladder incontinence, urinary retention.  HPI  Past Medical History:  Diagnosis Date  . Diabetes mellitus without complication ([place])   . Hypertension     There are no active problems to display for this patient.   History reviewed. No pertinent surgical history.     Home Medications    Prior to Admission medications   Medication Sig Start Date End Date Taking? Authorizing Provider  amLODipine (NORVASC) 5 MG tablet Take 5 mg by mouth daily. 07/16/16   Historical Provider, MD  diazepam (VALIUM) 10 MG tablet Take 1 tablet (10 mg total) by mouth every 8 (eight) hours as needed (muscle spasm). 11/28/16   Daleen Bo, MD  docusate sodium (COLACE) 100 MG capsule Take 1 capsule (100 mg total) by mouth every 12 (twelve) hours. 11/25/16   Charlesetta Shanks, MD  esomeprazole (NEXIUM) 40 MG capsule Take 1 capsule (40 mg total) by mouth daily. Patient not taking: Reported on 12/10/2016 08/09/16   Drenda Freeze, MD  hydrochlorothiazide (HYDRODIURIL) 12.5 MG tablet Take 12.5 mg by mouth daily. 08/03/16   Historical Provider, MD  morphine (MSIR) 15 MG tablet Take 1 tablet (15 mg total) by mouth every 6 (six) hours as needed for severe pain. 12/13/16   Recardo Evangelist, PA-C  naproxen (NAPROSYN) 375 MG tablet Take 1  tablet (375 mg total) by mouth 2 (two) times daily. 11/25/16   Charlesetta Shanks, MD  orphenadrine (NORFLEX) 100 MG tablet Take 1 tablet (100 mg total) by mouth 2 (two) times daily. Patient not taking: Reported on 12/10/2016 11/25/16   Charlesetta Shanks, MD  polyethylene glycol [facility]) packet Take 17 g by mouth daily. 12/10/16   Merryl Hacker, MD  senna-docusate (SENOKOT-S) 8.6-50 MG tablet Take 1 tablet by mouth daily. Patient not taking: Reported on 12/10/2016 11/15/16   Margette Fast, MD  sucralfate (CARAFATE) 1 g tablet Take 1 tablet (1 g total) by mouth 4 (four) times daily -  with meals and at bedtime. Patient not taking: Reported on 12/10/2016 08/09/16   Drenda Freeze, MD    Family History No family history on file.  Social History Social History  Substance Use Topics  . Smoking status: Never Smoker  . Smokeless tobacco: Never Used  . Alcohol use No     Allergies   Patient has no known allergies.   Review of Systems Review of Systems  Constitutional: Negative for chills and fever.  Genitourinary: Negative for difficulty urinating.  Musculoskeletal: Positive for arthralgias, back pain and gait problem.     Physical Exam Updated Vital Signs BP 126/81 (BP Location: Left Arm)   Pulse (!) 57   Temp 97.9 F (36.6 C) (Oral)   Resp 18   SpO2  94%   Physical Exam  Constitutional: He is oriented to person, place, and time. He appears well-developed and well-nourished. No distress.  HENT:  Head: Normocephalic and atraumatic.  Eyes: Conjunctivae are normal. Pupils are equal, round, and reactive to light. Right eye exhibits no discharge. Left eye exhibits no discharge. No scleral icterus.  Neck: Normal range of motion.  Cardiovascular: Normal rate.   Pulmonary/Chest: Effort normal. No respiratory distress.  Abdominal: He exhibits no distension.  Musculoskeletal:  Back: Inspection: No masses, deformity, or rash Palpation: Tenderness to midline lumbar spine and right  hip. Strength: 5/5 in lower extremities and normal plantar and dorsiflexion Sensation: Intact sensation with light touch in lower extremities bilaterally Gait: Normal gait Reflexes: Patellar reflex is 2+ bilaterally SLR: Negative supine straight leg raise   Neurological: He is alert and oriented to person, place, and time.  Skin: Skin is warm and dry.  Psychiatric: He has a normal mood and affect. His behavior is normal.  Nursing note and vitals reviewed.    ED Treatments / Results  Labs (all labs ordered are listed, but only abnormal results are displayed) Labs Reviewed - No data to display  EKG  EKG Interpretation None       Radiology No results found.  Procedures Procedures (including critical care time)  Medications Ordered in ED Medications - No data to display   Initial Impression / Assessment and Plan / ED Course  I have reviewed the triage vital signs and the nursing notes.  Pertinent labs & imaging results that were available during my care of the patient were reviewed by me and considered in my medical decision making (see chart for details).  Clinical Course    65 year old male presents with back pain with right sided radiculopathy. Pain is chronic and he is scheduled for surgery on the 29th. No new symptoms. No urinary retention or bowel/bladder incontinence. Advised we will provide rx for 2 days of narcotic pain medicine but he needs to follow up with Dr. Cyndy Freeze for further prescription. Return precautions discussed.   Final Clinical Impressions(s) / ED Diagnoses   Final diagnoses:  Chronic right-sided low back pain with right-sided sciatica    New Prescriptions New Prescriptions   MORPHINE (MSIR) 15 MG TABLET    Take 1 tablet (15 mg total) by mouth every 6 (six) hours as needed for severe pain.     Recardo Evangelist, PA-C 12/13/16 [id number]    Ezequiel Essex, MD 12/13/16 [phone]

## 2016-12-16 ENCOUNTER — Encounter (HOSPITAL_COMMUNITY): Payer: Self-pay

## 2016-12-16 ENCOUNTER — Encounter (HOSPITAL_COMMUNITY)
Admission: RE | Admit: 2016-12-16 | Discharge: 2016-12-16 | Disposition: A | Discharge status: Home / Self Care | Payer: Medicaid Other | Source: Ambulatory Visit | Attending: Neurological Surgery | Admitting: Neurological Surgery

## 2016-12-16 DIAGNOSIS — Z01812 Encounter for preprocedural laboratory examination: Secondary | ICD-10-CM | POA: Insufficient documentation

## 2016-12-16 DIAGNOSIS — M4800 Spinal stenosis, site unspecified: Secondary | ICD-10-CM | POA: Insufficient documentation

## 2016-12-16 LAB — BASIC METABOLIC PANEL
ANION GAP: 10 (ref 5–15)
BUN: 5 mg/dL — ABNORMAL LOW (ref 6–20)
CALCIUM: 9.7 mg/dL (ref 8.9–10.3)
CO2: 24 mmol/L (ref 22–32)
Chloride: 103 mmol/L (ref 101–111)
Creatinine, Ser: 1.02 mg/dL (ref 0.61–1.24)
Glucose, Bld: 114 mg/dL — ABNORMAL HIGH (ref 65–99)
POTASSIUM: 3.8 mmol/L (ref 3.5–5.1)
Sodium: 137 mmol/L (ref 135–145)

## 2016-12-16 LAB — GLUCOSE, CAPILLARY: Glucose-Capillary: 100 mg/dL — ABNORMAL HIGH (ref 65–99)

## 2016-12-16 LAB — SURGICAL PCR SCREEN
MRSA, PCR: NEGATIVE
Staphylococcus aureus: NEGATIVE

## 2016-12-16 LAB — CBC
HEMATOCRIT: 45.4 % (ref 39.0–52.0)
HEMOGLOBIN: 16.6 g/dL (ref 13.0–17.0)
MCH: 31.2 pg (ref 26.0–34.0)
MCHC: 36.6 g/dL — ABNORMAL HIGH (ref 30.0–36.0)
MCV: 85.3 fL (ref 78.0–100.0)
Platelets: 216 10*3/uL (ref 150–400)
RBC: 5.32 MIL/uL (ref 4.22–5.81)
RDW: 11.9 % (ref 11.5–15.5)
WBC: 6.6 10*3/uL (ref 4.0–10.5)

## 2016-12-16 NOTE — Progress Notes (Addendum)
Left message for Verlene Mayer, @ Dr. Hewitt Shorts office, that orders are not signed.  [id number] [facility].  States  Fasting blood sugars  91.

## 2016-12-16 NOTE — Pre-Procedure Instructions (Addendum)
Jorge [name]  12/16/2016      [facility] [place], [place] [address] [place] AT [address] [place] [address] Phone: [phone] Fax: [phone]    Your procedure is scheduled on Fri. Dec. 29  Report to [facility] Admitting at 5:30 A.M.  Call this number if you have problems the morning of surgery:  [phone]   Remember:  Do not eat food or drink liquids after midnight on Thurs. Dec. 28   Take these medicines the morning of surgery with A SIP OF WATER: amlodipine (norvasc),diazepam (valium), morphine if needed             1 week prior to surgery stop aspirin, advil, motrin, ibuprofen, aleve,vitamins/herbal medicines    How to Manage Your Diabetes Before and After Surgery  Why is it important to control my blood sugar before and after surgery? . Improving blood sugar levels before and after surgery helps healing and can limit problems. . A way of improving blood sugar control is eating a healthy diet by: o  Eating less sugar and carbohydrates o  Increasing activity/exercise o  Talking with your doctor about reaching your blood sugar goals . High blood sugars (greater than 180 mg/dL) can raise your risk of infections and slow your recovery, so you will need to focus on controlling your diabetes during the weeks before surgery. . Make sure that the doctor who takes care of your diabetes knows about your planned surgery including the date and location.  How do I manage my blood sugar before surgery? . Check your blood sugar at least 4 times a day, starting 2 days before surgery, to make sure that the level is not too high or low. o Check your blood sugar the morning of your surgery when you wake up and every 2 hours until you get to the Short Stay unit. . If your blood sugar is less than 70 mg/dL, you will need to treat for low blood sugar: o Do not take insulin. o Treat a low blood sugar (less than 70 mg/dL)  with  cup of clear juice (cranberry or apple), 4 glucose tablets, OR glucose gel. o Recheck blood sugar in 15 minutes after treatment (to make sure it is greater than 70 mg/dL). If your blood sugar is not greater than 70 mg/dL on recheck, call [phone] for further instructions. . Report your blood sugar to the short stay nurse when you get to Short Stay.  . If you are admitted to the hospital after surgery: o Your blood sugar will be checked by the staff and you will probably be given insulin after surgery (instead of oral diabetes medicines) to make sure you have good blood sugar levels. o The goal for blood sugar control after surgery is 80-180 mg/dL.          Do not wear jewelry.  Do not wear lotions, powders, or cologne, or deoderant.  Do not shave 48 hours prior to surgery.  Men may shave face and neck.  Do not bring valuables to the hospital.  [facility] is not responsible for any belongings or valuables.  Contacts, dentures or bridgework may not be worn into surgery.  Leave your suitcase in the car.  After surgery it may be brought to your room.  For patients admitted to the hospital, discharge time will be determined by your treatment team.  Patients discharged the  day of surgery will not be allowed to drive home.    Special instructions:  Review preparing for surgery  Please read over the following fact sheets that you were given. Coughing and Deep Breathing and MRSA Information

## 2016-12-16 NOTE — Progress Notes (Signed)
   12/16/16 1240  OBSTRUCTIVE SLEEP APNEA  Have you ever been diagnosed with sleep apnea through a sleep study? No  Do you snore loudly (loud enough to be heard through closed doors)?  1  Do you often feel tired, fatigued, or sleepy during the daytime (such as falling asleep during driving or talking to someone)? 0  Has anyone observed you stop breathing during your sleep? 1  Do you have, or are you being treated for high blood pressure? 1  BMI more than 35 kg/m2? 0  Age > 50 (1-yes) 1  Neck circumference greater than:Male 16 inches or larger, Male 17inches or larger? 1  Male Gender (Yes=1) 1  Obstructive Sleep Apnea Score 6  Score 5 or greater  Results sent to PCP

## 2016-12-17 LAB — HEMOGLOBIN A1C
HEMOGLOBIN A1C: 5.9 % — AB (ref 4.8–5.6)
MEAN PLASMA GLUCOSE: 123 mg/dL

## 2016-12-24 NOTE — Progress Notes (Signed)
Re-requested last OV,echo/stress/EKG along with any other cardiac records from [facility].

## 2016-12-25 ENCOUNTER — Ambulatory Visit (HOSPITAL_COMMUNITY)
Admission: RE | Admit: 2016-12-25 | Discharge: 2016-12-26 | Disposition: A | Discharge status: Home / Self Care | Payer: Medicaid Other | Source: Ambulatory Visit | Attending: Neurological Surgery | Admitting: Neurological Surgery

## 2016-12-25 ENCOUNTER — Ambulatory Visit (HOSPITAL_COMMUNITY): Payer: Medicaid Other | Admitting: Anesthesiology

## 2016-12-25 ENCOUNTER — Encounter (HOSPITAL_COMMUNITY): Payer: Self-pay | Admitting: *Deleted

## 2016-12-25 ENCOUNTER — Encounter (HOSPITAL_COMMUNITY)
Admission: RE | Disposition: A | Discharge status: Home / Self Care | Payer: Self-pay | Source: Ambulatory Visit | Attending: Neurological Surgery

## 2016-12-25 ENCOUNTER — Ambulatory Visit (HOSPITAL_COMMUNITY): Payer: Medicaid Other

## 2016-12-25 DIAGNOSIS — E119 Type 2 diabetes mellitus without complications: Secondary | ICD-10-CM | POA: Diagnosis not present

## 2016-12-25 DIAGNOSIS — M47816 Spondylosis without myelopathy or radiculopathy, lumbar region: Secondary | ICD-10-CM | POA: Diagnosis not present

## 2016-12-25 DIAGNOSIS — M5126 Other intervertebral disc displacement, lumbar region: Secondary | ICD-10-CM | POA: Insufficient documentation

## 2016-12-25 DIAGNOSIS — M48061 Spinal stenosis, lumbar region without neurogenic claudication: Secondary | ICD-10-CM | POA: Insufficient documentation

## 2016-12-25 DIAGNOSIS — Z87891 Personal history of nicotine dependence: Secondary | ICD-10-CM | POA: Diagnosis not present

## 2016-12-25 DIAGNOSIS — I10 Essential (primary) hypertension: Secondary | ICD-10-CM | POA: Insufficient documentation

## 2016-12-25 DIAGNOSIS — M4727 Other spondylosis with radiculopathy, lumbosacral region: Secondary | ICD-10-CM | POA: Diagnosis present

## 2016-12-25 DIAGNOSIS — Z419 Encounter for procedure for purposes other than remedying health state, unspecified: Secondary | ICD-10-CM

## 2016-12-25 HISTORY — PX: LUMBAR LAMINECTOMY/DECOMPRESSION MICRODISCECTOMY: SHX5026

## 2016-12-25 LAB — GLUCOSE, CAPILLARY
Glucose-Capillary: 117 mg/dL — ABNORMAL HIGH (ref 65–99)
Glucose-Capillary: 137 mg/dL — ABNORMAL HIGH (ref 65–99)
Glucose-Capillary: 214 mg/dL — ABNORMAL HIGH (ref 65–99)
Glucose-Capillary: 143 mg/dL — ABNORMAL HIGH (ref 65–99)

## 2016-12-25 SURGERY — LUMBAR LAMINECTOMY/DECOMPRESSION MICRODISCECTOMY 2 LEVELS
Anesthesia: General | Site: Back | Laterality: Right

## 2016-12-25 MED ORDER — LACTATED RINGERS IV SOLN
INTRAVENOUS | Status: DC | PRN
Start: 2016-12-25 — End: 2016-12-25
  Administered 2016-12-25 (×2): via INTRAVENOUS

## 2016-12-25 MED ORDER — SODIUM CHLORIDE 0.9 % IV SOLN: 250.0000 mL | INTRAVENOUS | Status: DC

## 2016-12-25 MED ORDER — DEXAMETHASONE SODIUM PHOSPHATE 10 MG/ML IJ SOLN
INTRAMUSCULAR | Status: DC | PRN
  Administered 2016-12-25: 10 mg via INTRAVENOUS

## 2016-12-25 MED ORDER — LIDOCAINE HCL (CARDIAC) 20 MG/ML IV SOLN
INTRAVENOUS | Status: DC | PRN
  Administered 2016-12-25: 40 mg via INTRAVENOUS
  Administered 2016-12-25: 60 mg via INTRAVENOUS

## 2016-12-25 MED ORDER — HYDROMORPHONE HCL 1 MG/ML IJ SOLN: 0.2500 mg | INTRAMUSCULAR | Status: DC | PRN

## 2016-12-25 MED ORDER — LIDOCAINE-EPINEPHRINE (PF) 2 %-1:200000 IJ SOLN
INTRAMUSCULAR | Status: DC | PRN
  Administered 2016-12-25: 10 mL

## 2016-12-25 MED ORDER — ALBUMIN HUMAN 5 % IV SOLN
INTRAVENOUS | Status: DC | PRN
  Administered 2016-12-25: 09:00:00 via INTRAVENOUS

## 2016-12-25 MED ORDER — INSULIN ASPART 100 UNIT/ML ~~LOC~~ SOLN
0.0000 [IU] | Freq: Three times a day (TID) | SUBCUTANEOUS | Status: DC
  Administered 2016-12-25: 3 [IU] via SUBCUTANEOUS

## 2016-12-25 MED ORDER — ARTIFICIAL TEARS OP OINT
TOPICAL_OINTMENT | OPHTHALMIC | Status: DC | PRN
  Administered 2016-12-25: 1 via OPHTHALMIC

## 2016-12-25 MED ORDER — ALUM & MAG HYDROXIDE-SIMETH 200-200-20 MG/5ML PO SUSP: 30.0000 mL | Freq: Four times a day (QID) | ORAL | Status: DC | PRN

## 2016-12-25 MED ORDER — HYDROCHLOROTHIAZIDE 12.5 MG PO CAPS
12.5000 mg | ORAL_CAPSULE | Freq: Every day | ORAL | Status: DC
  Administered 2016-12-25: 12.5 mg via ORAL
  Filled 2016-12-25: qty 1

## 2016-12-25 MED ORDER — MIDAZOLAM HCL 2 MG/2ML IJ SOLN
INTRAMUSCULAR | Status: AC
  Filled 2016-12-25: qty 2

## 2016-12-25 MED ORDER — OXYCODONE HCL ER 20 MG PO T12A: 20.0000 mg | EXTENDED_RELEASE_TABLET | Freq: Two times a day (BID) | ORAL | Status: DC

## 2016-12-25 MED ORDER — FENTANYL CITRATE (PF) 100 MCG/2ML IJ SOLN
INTRAMUSCULAR | Status: DC | PRN
  Administered 2016-12-25 (×4): 50 ug via INTRAVENOUS

## 2016-12-25 MED ORDER — THROMBIN 5000 UNITS EX SOLR
CUTANEOUS | Status: AC
  Filled 2016-12-25: qty 5000

## 2016-12-25 MED ORDER — SENNA 8.6 MG PO TABS
1.0000 | ORAL_TABLET | Freq: Two times a day (BID) | ORAL | Status: DC
  Administered 2016-12-25 (×2): 8.6 mg via ORAL
  Filled 2016-12-25 (×2): qty 1

## 2016-12-25 MED ORDER — DEXTROSE 5 % IV SOLN
INTRAVENOUS | Status: DC | PRN
  Administered 2016-12-25: 07:00:00 via INTRAVENOUS

## 2016-12-25 MED ORDER — THROMBIN 5000 UNITS EX SOLR
CUTANEOUS | Status: DC | PRN
  Administered 2016-12-25 (×2): 5000 [IU] via TOPICAL

## 2016-12-25 MED ORDER — ONDANSETRON HCL 4 MG/2ML IJ SOLN
INTRAMUSCULAR | Status: AC
Start: 2016-12-25 — End: 2016-12-25
  Filled 2016-12-25: qty 2

## 2016-12-25 MED ORDER — PROPOFOL 10 MG/ML IV BOLUS
INTRAVENOUS | Status: AC
  Filled 2016-12-25: qty 20

## 2016-12-25 MED ORDER — MIDAZOLAM HCL 5 MG/5ML IJ SOLN
INTRAMUSCULAR | Status: DC | PRN
  Administered 2016-12-25: 2 mg via INTRAVENOUS

## 2016-12-25 MED ORDER — BUPIVACAINE HCL (PF) 0.5 % IJ SOLN
INTRAMUSCULAR | Status: DC | PRN
  Administered 2016-12-25: 10 mL

## 2016-12-25 MED ORDER — ACETAMINOPHEN 500 MG PO TABS
1000.0000 mg | ORAL_TABLET | Freq: Four times a day (QID) | ORAL | Status: DC
  Administered 2016-12-25 – 2016-12-26 (×4): 1000 mg via ORAL
  Filled 2016-12-25 (×4): qty 2

## 2016-12-25 MED ORDER — MENTHOL 3 MG MT LOZG: 1.0000 | LOZENGE | OROMUCOSAL | Status: DC | PRN

## 2016-12-25 MED ORDER — SUCCINYLCHOLINE CHLORIDE 200 MG/10ML IV SOSY
PREFILLED_SYRINGE | INTRAVENOUS | Status: AC
  Filled 2016-12-25: qty 10

## 2016-12-25 MED ORDER — HYDROCHLOROTHIAZIDE 25 MG PO TABS: 12.5000 mg | ORAL_TABLET | Freq: Every day | ORAL | Status: DC

## 2016-12-25 MED ORDER — SODIUM CHLORIDE 0.9 % IR SOLN
Status: DC | PRN
  Administered 2016-12-25: 08:00:00

## 2016-12-25 MED ORDER — LIDOCAINE-EPINEPHRINE (PF) 2 %-1:200000 IJ SOLN
INTRAMUSCULAR | Status: AC
  Filled 2016-12-25: qty 20

## 2016-12-25 MED ORDER — ROCURONIUM BROMIDE 50 MG/5ML IV SOSY
PREFILLED_SYRINGE | INTRAVENOUS | Status: AC
  Filled 2016-12-25: qty 10

## 2016-12-25 MED ORDER — SODIUM CHLORIDE 0.9% FLUSH
3.0000 mL | Freq: Two times a day (BID) | INTRAVENOUS | Status: DC
Start: 2016-12-25 — End: 2016-12-26

## 2016-12-25 MED ORDER — AMLODIPINE BESYLATE 5 MG PO TABS
5.0000 mg | ORAL_TABLET | Freq: Every day | ORAL | Status: DC
  Filled 2016-12-25: qty 1

## 2016-12-25 MED ORDER — KETOROLAC TROMETHAMINE 30 MG/ML IJ SOLN
INTRAMUSCULAR | Status: AC
  Filled 2016-12-25: qty 1

## 2016-12-25 MED ORDER — BUPIVACAINE HCL (PF) 0.25 % IJ SOLN
INTRAMUSCULAR | Status: AC
  Filled 2016-12-25: qty 30

## 2016-12-25 MED ORDER — DIAZEPAM 5 MG PO TABS: 10.0000 mg | ORAL_TABLET | Freq: Three times a day (TID) | ORAL | Status: DC | PRN

## 2016-12-25 MED ORDER — OXYCODONE HCL 5 MG PO TABS
5.0000 mg | ORAL_TABLET | ORAL | Status: DC | PRN
  Administered 2016-12-26: 10 mg via ORAL
  Filled 2016-12-25: qty 2

## 2016-12-25 MED ORDER — SODIUM CHLORIDE 0.9% FLUSH: 3.0000 mL | INTRAVENOUS | Status: DC | PRN

## 2016-12-25 MED ORDER — ONDANSETRON HCL 4 MG/2ML IJ SOLN: 4.0000 mg | Freq: Four times a day (QID) | INTRAMUSCULAR | Status: DC | PRN

## 2016-12-25 MED ORDER — METOCLOPRAMIDE HCL 5 MG/ML IJ SOLN
INTRAMUSCULAR | Status: DC | PRN
  Administered 2016-12-25: 10 mg via INTRAVENOUS

## 2016-12-25 MED ORDER — HEMOSTATIC AGENTS (NO CHARGE) OPTIME
TOPICAL | Status: DC | PRN
  Administered 2016-12-25: 1 via TOPICAL

## 2016-12-25 MED ORDER — 0.9 % SODIUM CHLORIDE (POUR BTL) OPTIME
TOPICAL | Status: DC | PRN
  Administered 2016-12-25: 1000 mL

## 2016-12-25 MED ORDER — ONDANSETRON HCL 4 MG/2ML IJ SOLN
INTRAMUSCULAR | Status: DC | PRN
  Administered 2016-12-25: 4 mg via INTRAVENOUS

## 2016-12-25 MED ORDER — PANTOPRAZOLE SODIUM 40 MG IV SOLR: 40.0000 mg | Freq: Every day | INTRAVENOUS | Status: DC

## 2016-12-25 MED ORDER — VANCOMYCIN HCL 1000 MG IV SOLR
INTRAVENOUS | Status: AC
  Filled 2016-12-25: qty 1000

## 2016-12-25 MED ORDER — LIDOCAINE 2% (20 MG/ML) 5 ML SYRINGE
INTRAMUSCULAR | Status: AC
Start: 2016-12-25 — End: 2016-12-25
  Filled 2016-12-25: qty 10

## 2016-12-25 MED ORDER — KETOROLAC TROMETHAMINE 30 MG/ML IJ SOLN
INTRAMUSCULAR | Status: DC | PRN
  Administered 2016-12-25: 30 mg

## 2016-12-25 MED ORDER — EPHEDRINE 5 MG/ML INJ
INTRAVENOUS | Status: AC
  Filled 2016-12-25: qty 10

## 2016-12-25 MED ORDER — PHENOL 1.4 % MT LIQD: 1.0000 | OROMUCOSAL | Status: DC | PRN

## 2016-12-25 MED ORDER — ONDANSETRON HCL 4 MG/2ML IJ SOLN: 4.0000 mg | INTRAMUSCULAR | Status: DC | PRN

## 2016-12-25 MED ORDER — OXYCODONE HCL 5 MG/5ML PO SOLN: 5.0000 mg | Freq: Once | ORAL | Status: DC | PRN

## 2016-12-25 MED ORDER — METHYLPREDNISOLONE ACETATE 80 MG/ML IJ SUSP
INTRAMUSCULAR | Status: DC | PRN
  Administered 2016-12-25: 80 mg

## 2016-12-25 MED ORDER — DOCUSATE SODIUM 100 MG PO CAPS
100.0000 mg | ORAL_CAPSULE | Freq: Two times a day (BID) | ORAL | Status: DC
  Administered 2016-12-25 (×2): 100 mg via ORAL
  Filled 2016-12-25 (×2): qty 1

## 2016-12-25 MED ORDER — CEFAZOLIN IN D5W 1 GM/50ML IV SOLN
1.0000 g | Freq: Three times a day (TID) | INTRAVENOUS | Status: AC
  Administered 2016-12-25 (×2): 1 g via INTRAVENOUS
  Filled 2016-12-25 (×2): qty 50

## 2016-12-25 MED ORDER — BUPIVACAINE LIPOSOME 1.3 % IJ SUSP
20.0000 mL | INTRAMUSCULAR | Status: AC
  Administered 2016-12-25: 20 mL
  Filled 2016-12-25: qty 20

## 2016-12-25 MED ORDER — METHOCARBAMOL 750 MG PO TABS
750.0000 mg | ORAL_TABLET | Freq: Four times a day (QID) | ORAL | Status: DC
  Administered 2016-12-25 (×2): 750 mg via ORAL
  Filled 2016-12-25 (×3): qty 1

## 2016-12-25 MED ORDER — FENTANYL CITRATE (PF) 100 MCG/2ML IJ SOLN
INTRAMUSCULAR | Status: AC
  Filled 2016-12-25: qty 4

## 2016-12-25 MED ORDER — EPHEDRINE SULFATE 50 MG/ML IJ SOLN
INTRAMUSCULAR | Status: DC | PRN
  Administered 2016-12-25: 5 mg via INTRAVENOUS
  Administered 2016-12-25: 10 mg via INTRAVENOUS
  Administered 2016-12-25: 5 mg via INTRAVENOUS

## 2016-12-25 MED ORDER — CHLORHEXIDINE GLUCONATE CLOTH 2 % EX PADS: 6.0000 | MEDICATED_PAD | Freq: Once | CUTANEOUS | Status: DC

## 2016-12-25 MED ORDER — PROPOFOL 10 MG/ML IV BOLUS
INTRAVENOUS | Status: DC | PRN
  Administered 2016-12-25: 180 mg via INTRAVENOUS

## 2016-12-25 MED ORDER — METHYLPREDNISOLONE ACETATE 80 MG/ML IJ SUSP
INTRAMUSCULAR | Status: AC
Start: 2016-12-25 — End: 2016-12-25
  Filled 2016-12-25: qty 1

## 2016-12-25 MED ORDER — PANTOPRAZOLE SODIUM 40 MG PO TBEC
40.0000 mg | DELAYED_RELEASE_TABLET | Freq: Every day | ORAL | Status: DC
  Administered 2016-12-25: 40 mg via ORAL
  Filled 2016-12-25: qty 1

## 2016-12-25 MED ORDER — BUPIVACAINE HCL (PF) 0.25 % IJ SOLN
INTRAMUSCULAR | Status: DC | PRN
  Administered 2016-12-25: 1 mL

## 2016-12-25 MED ORDER — BISACODYL 10 MG RE SUPP: 10.0000 mg | Freq: Every day | RECTAL | Status: DC | PRN

## 2016-12-25 MED ORDER — PHENYLEPHRINE 40 MCG/ML (10ML) SYRINGE FOR IV PUSH (FOR BLOOD PRESSURE SUPPORT)
PREFILLED_SYRINGE | INTRAVENOUS | Status: AC
  Filled 2016-12-25: qty 10

## 2016-12-25 MED ORDER — OXYCODONE HCL 5 MG PO TABS: 5.0000 mg | ORAL_TABLET | Freq: Once | ORAL | Status: DC | PRN

## 2016-12-25 MED ORDER — DOCUSATE SODIUM 100 MG PO CAPS: 100.0000 mg | ORAL_CAPSULE | Freq: Two times a day (BID) | ORAL | Status: DC

## 2016-12-25 MED ORDER — ROCURONIUM BROMIDE 100 MG/10ML IV SOLN
INTRAVENOUS | Status: DC | PRN
  Administered 2016-12-25: 50 mg via INTRAVENOUS

## 2016-12-25 MED ORDER — FLEET ENEMA 7-19 GM/118ML RE ENEM: 1.0000 | ENEMA | Freq: Once | RECTAL | Status: DC | PRN

## 2016-12-25 MED ORDER — SUGAMMADEX SODIUM 200 MG/2ML IV SOLN
INTRAVENOUS | Status: DC | PRN
  Administered 2016-12-25: 200 mg via INTRAVENOUS

## 2016-12-25 MED ORDER — VECURONIUM BROMIDE 10 MG IV SOLR
INTRAVENOUS | Status: DC | PRN
  Administered 2016-12-25: 2 mg via INTRAVENOUS

## 2016-12-25 MED ORDER — SUGAMMADEX SODIUM 200 MG/2ML IV SOLN
INTRAVENOUS | Status: AC
  Filled 2016-12-25: qty 2

## 2016-12-25 MED ORDER — DEXAMETHASONE SODIUM PHOSPHATE 10 MG/ML IJ SOLN
INTRAMUSCULAR | Status: AC
  Filled 2016-12-25: qty 1

## 2016-12-25 MED ORDER — POTASSIUM CHLORIDE IN NACL 20-0.9 MEQ/L-% IV SOLN: 100.0000 mL/h | INTRAVENOUS | Status: DC

## 2016-12-25 MED ORDER — POLYETHYLENE GLYCOL 3350 17 G PO PACK
17.0000 g | PACK | Freq: Every day | ORAL | Status: DC
  Administered 2016-12-25: 17 g via ORAL
  Filled 2016-12-25: qty 1

## 2016-12-25 MED ORDER — CELECOXIB 200 MG PO CAPS
200.0000 mg | ORAL_CAPSULE | Freq: Two times a day (BID) | ORAL | Status: DC
  Administered 2016-12-25 (×2): 200 mg via ORAL
  Filled 2016-12-25 (×2): qty 1

## 2016-12-25 MED ORDER — THROMBIN 5000 UNITS EX SOLR
CUTANEOUS | Status: AC
  Filled 2016-12-25: qty 15000

## 2016-12-25 MED ORDER — CEFAZOLIN SODIUM-DEXTROSE 2-4 GM/100ML-% IV SOLN
2.0000 g | INTRAVENOUS | Status: AC
  Administered 2016-12-25: 2 g via INTRAVENOUS
  Filled 2016-12-25: qty 100

## 2016-12-25 MED ORDER — SODIUM CHLORIDE 0.9 % IJ SOLN
INTRAMUSCULAR | Status: AC
  Filled 2016-12-25: qty 10

## 2016-12-25 MED ORDER — GABAPENTIN 300 MG PO CAPS
300.0000 mg | ORAL_CAPSULE | Freq: Three times a day (TID) | ORAL | Status: DC
  Administered 2016-12-25 (×2): 300 mg via ORAL
  Filled 2016-12-25 (×2): qty 1

## 2016-12-25 MED ORDER — METOCLOPRAMIDE HCL 5 MG/ML IJ SOLN
INTRAMUSCULAR | Status: AC
  Filled 2016-12-25: qty 2

## 2016-12-25 MED ORDER — BUPIVACAINE HCL (PF) 0.5 % IJ SOLN
INTRAMUSCULAR | Status: AC
  Filled 2016-12-25: qty 30

## 2016-12-25 MED ORDER — GELATIN ABSORBABLE MT POWD
OROMUCOSAL | Status: DC | PRN
  Administered 2016-12-25 (×2): via TOPICAL

## 2016-12-25 MED ORDER — VANCOMYCIN HCL 1000 MG IV SOLR
INTRAVENOUS | Status: DC | PRN
  Administered 2016-12-25: 1000 mg via TOPICAL

## 2016-12-25 MED ORDER — ARTIFICIAL TEARS OP OINT
TOPICAL_OINTMENT | OPHTHALMIC | Status: AC
  Filled 2016-12-25: qty 7

## 2016-12-25 SURGICAL SUPPLY — 70 items
BAG DECANTER FOR FLEXI CONT (MISCELLANEOUS) ×2 IMPLANT
BENZOIN TINCTURE PRP APPL 2/3 (GAUZE/BANDAGES/DRESSINGS) IMPLANT
BIT DRILL NEURO 2X3.1 SFT TUCH (MISCELLANEOUS) ×1 IMPLANT
BLADE CLIPPER SURG (BLADE) IMPLANT
BLADE SURG 11 STRL SS (BLADE) ×2 IMPLANT
BUR MATCHSTICK NEURO 3.0 LAGG (BURR) ×2 IMPLANT
BUR ROUND FLUTED 5 RND (BURR) ×2 IMPLANT
CANISTER SUCT 3000ML PPV (MISCELLANEOUS) ×2 IMPLANT
CARTRIDGE OIL MAESTRO DRILL (MISCELLANEOUS) ×1 IMPLANT
CHLORAPREP W/TINT 26ML (MISCELLANEOUS) ×2 IMPLANT
CONT SPEC 4OZ CLIKSEAL STRL BL (MISCELLANEOUS) ×2 IMPLANT
DECANTER SPIKE VIAL GLASS SM (MISCELLANEOUS) IMPLANT
DERMABOND ADVANCED (GAUZE/BANDAGES/DRESSINGS) ×1
DERMABOND ADVANCED .7 DNX12 (GAUZE/BANDAGES/DRESSINGS) ×1 IMPLANT
DIFFUSER DRILL AIR PNEUMATIC (MISCELLANEOUS) ×2 IMPLANT
DRAPE MICROSCOPE LEICA (MISCELLANEOUS) ×2 IMPLANT
DRAPE POUCH INSTRU U-SHP 10X18 (DRAPES) ×2 IMPLANT
DRAPE SURG 17X23 STRL (DRAPES) ×2 IMPLANT
DRILL NEURO 2X3.1 SOFT TOUCH (MISCELLANEOUS) ×2
DRSG OPSITE POSTOP 4X6 (GAUZE/BANDAGES/DRESSINGS) ×2 IMPLANT
ELECT REM PT RETURN 9FT ADLT (ELECTROSURGICAL) ×2
ELECTRODE REM PT RTRN 9FT ADLT (ELECTROSURGICAL) ×1 IMPLANT
GAUZE SPONGE 4X4 12PLY STRL (GAUZE/BANDAGES/DRESSINGS) IMPLANT
GAUZE SPONGE 4X4 16PLY XRAY LF (GAUZE/BANDAGES/DRESSINGS) IMPLANT
GLOVE BIOGEL PI IND STRL 7.5 (GLOVE) ×1 IMPLANT
GLOVE BIOGEL PI IND STRL 8 (GLOVE) ×3 IMPLANT
GLOVE BIOGEL PI IND STRL 8.5 (GLOVE) ×1 IMPLANT
GLOVE BIOGEL PI INDICATOR 7.5 (GLOVE) ×1
GLOVE BIOGEL PI INDICATOR 8 (GLOVE) ×3
GLOVE BIOGEL PI INDICATOR 8.5 (GLOVE) ×1
GLOVE ECLIPSE 7.5 STRL STRAW (GLOVE) ×6 IMPLANT
GLOVE ECLIPSE 8.5 STRL (GLOVE) ×2 IMPLANT
GLOVE SS BIOGEL STRL SZ 7.5 (GLOVE) ×2 IMPLANT
GLOVE SUPERSENSE BIOGEL SZ 7.5 (GLOVE) ×2
GLOVE SURG SS PI 8.0 STRL IVOR (GLOVE) ×2 IMPLANT
GOWN STRL REUS W/ TWL LRG LVL3 (GOWN DISPOSABLE) ×1 IMPLANT
GOWN STRL REUS W/ TWL XL LVL3 (GOWN DISPOSABLE) IMPLANT
GOWN STRL REUS W/TWL 2XL LVL3 (GOWN DISPOSABLE) ×6 IMPLANT
GOWN STRL REUS W/TWL LRG LVL3 (GOWN DISPOSABLE) ×1
GOWN STRL REUS W/TWL XL LVL3 (GOWN DISPOSABLE)
HEMOSTAT POWDER KIT SURGIFOAM (HEMOSTASIS) ×4 IMPLANT
KIT BASIN OR (CUSTOM PROCEDURE TRAY) ×2 IMPLANT
KIT ROOM TURNOVER OR (KITS) ×2 IMPLANT
NEEDLE HYPO 18GX1.5 BLUNT FILL (NEEDLE) ×2 IMPLANT
NEEDLE HYPO 21X1.5 SAFETY (NEEDLE) ×4 IMPLANT
NEEDLE HYPO 25X1 1.5 SAFETY (NEEDLE) IMPLANT
NS IRRIG 1000ML POUR BTL (IV SOLUTION) ×2 IMPLANT
OIL CARTRIDGE MAESTRO DRILL (MISCELLANEOUS) ×2
PACK LAMINECTOMY NEURO (CUSTOM PROCEDURE TRAY) ×2 IMPLANT
PACK UNIVERSAL I (CUSTOM PROCEDURE TRAY) ×2 IMPLANT
PAD ARMBOARD 7.5X6 YLW CONV (MISCELLANEOUS) ×8 IMPLANT
PATTIES SURGICAL .5X1.5 (GAUZE/BANDAGES/DRESSINGS) ×2 IMPLANT
RUBBERBAND STERILE (MISCELLANEOUS) ×4 IMPLANT
SEALANT ADHERUS EXTEND TIP (MISCELLANEOUS) ×2 IMPLANT
SPONGE SURGIFOAM ABS GEL SZ50 (HEMOSTASIS) ×2 IMPLANT
STRIP CLOSURE SKIN 1/2X4 (GAUZE/BANDAGES/DRESSINGS) IMPLANT
SUT PROLENE 6 0 BV (SUTURE) ×4 IMPLANT
SUT STRATAFIX MNCRL+ 3-0 PS-2 (SUTURE) ×1
SUT STRATAFIX MONOCRYL 3-0 (SUTURE) ×1
SUT VIC AB 0 CT1 18XCR BRD8 (SUTURE) ×2 IMPLANT
SUT VIC AB 0 CT1 8-18 (SUTURE) ×2
SUT VIC AB 2-0 CT1 18 (SUTURE) ×4 IMPLANT
SUT VIC AB 4-0 PS2 27 (SUTURE) ×2 IMPLANT
SUTURE STRATFX MNCRL+ 3-0 PS-2 (SUTURE) ×1 IMPLANT
SYR 30ML LL (SYRINGE) ×2 IMPLANT
SYR 5ML LL (SYRINGE) ×2 IMPLANT
TOWEL OR 17X24 6PK STRL BLUE (TOWEL DISPOSABLE) ×2 IMPLANT
TOWEL OR 17X26 10 PK STRL BLUE (TOWEL DISPOSABLE) ×2 IMPLANT
TUBE CONNECTING 12X1/4 (SUCTIONS) IMPLANT
WATER STERILE IRR 1000ML POUR (IV SOLUTION) ×2 IMPLANT

## 2016-12-25 NOTE — Anesthesia Postprocedure Evaluation (Signed)
Anesthesia Post Note  Patient: Jorge [name]  Procedure(s) Performed: Procedure(s) (LRB): Lumbar three-five Laminectomy, Right Lumbar three-four, Lumbar four-five Diskectomy  (Right)  Patient location during evaluation: PACU Anesthesia Type: General Level of consciousness: awake and alert and patient cooperative Pain management: pain level controlled Vital Signs Assessment: post-procedure vital signs reviewed and stable Respiratory status: spontaneous breathing and respiratory function stable Cardiovascular status: stable Anesthetic complications: no       Last Vitals:  Vitals:   12/25/16 1050 12/25/16 1119  BP: 129/83 130/85  Pulse: 85   Resp: (!) 27 20  Temp:  36.9 C    Last Pain:  Vitals:   12/25/16 1045  TempSrc:   PainSc: 0-No pain                 [name]

## 2016-12-25 NOTE — Anesthesia Procedure Notes (Signed)
Procedure Name: Intubation Date/Time: 12/25/2016 7:42 AM Performed by: Jacquiline Doe A Pre-anesthesia Checklist: Patient identified, Emergency Drugs available, Suction available and Patient being monitored Patient Re-evaluated:Patient Re-evaluated prior to inductionOxygen Delivery Method: Circle System Utilized and Circle system utilized Preoxygenation: Pre-oxygenation with 100% oxygen Intubation Type: IV induction and Cricoid Pressure applied Ventilation: Mask ventilation without difficulty Laryngoscope Size: Mac and 3 Grade View: Grade II Tube type: Oral Tube size: 7.5 mm Number of attempts: 1 Airway Equipment and Method: Stylet Placement Confirmation: ETT inserted through vocal cords under direct vision,  positive ETCO2 and breath sounds checked- equal and bilateral Secured at: 23 cm Tube secured with: Tape Dental Injury: Teeth and Oropharynx as per pre-operative assessment

## 2016-12-25 NOTE — Progress Notes (Signed)
OT Cancellation Note  Patient Details Name: Jorge [name] MRN: [id number] DOB: 22-Jan-1951   Cancelled Treatment:    Reason Eval/Treat Not Completed: Medical issues which prohibited therapy (bedrest until AM). Will follow up for OT eval tomorrow as time allows.  Binnie Kand M.S., OTR/L Pager: [phone]  12/25/2016, 3:39 PM

## 2016-12-25 NOTE — Op Note (Signed)
12/25/2016  9:44 AM  PATIENT:  Jorge [name]  65 y.o. male  PRE-OPERATIVE DIAGNOSIS:  Lumbosacral spondylosis with radiculopathy  POST-OPERATIVE DIAGNOSIS:  Same  PROCEDURE:  L3-5 laminectomy for decompression; use of operating microscope  SURGEON:  Aldean Ast, MD  ASSISTANTS: Kristeen Miss, MD  ANESTHESIA:   General  DRAINS: None   SPECIMEN:  None  INDICATION FOR PROCEDURE: 64 year old male with intractable leg pain.  He has multilevel spondylosis with lateral recess stenosis.  He also has two disc bulges at L3-4 and L4-5. Patient understood the risks, benefits, and alternatives and potential outcomes and wished to proceed.  PROCEDURE DETAILS: After smooth induction of general endotracheal anesthesia the patient was turned prone on the operating table on a Wilson frame. The skin of the lumbar region was clipped of hair. It was wiped down with alcohol. The patient was then prepped and draped in usual sterile fashion.   The subcutaneous tissues of the midline from approximately L3 to L5 was infiltrated with Marcaine. The skin in this area was opened sharply. The soft tissues were dissected with monopolar cautery. Subperiosteal dissection was carried out along the sides of the spinous processes and the laminar surfaces to the medial edge of the facet joints from L3 to L5. The spinous processes were removed with rongeurs. The laminae were then thinned with a high-speed bur. The remaining lamina was resected with a Kerrison punch. Thickened ligamentum flavum was separated from the dura and resected with a Kerrison rongeur. The superior edge of the L5 lamina was separated from the thecal sac with an up angled curette.  There was a small dural rent without nerve root herniation when this was resected.  This was repaired primarily with running locked prolene suture.  The thecal sac was further freed from the hypertrophied ligament in the lateral recesses.  Lateral recess decompression was  completed. Decompression was carried out laterally to the foramina. The foramina were palpated and found to be without residual stenosis. The operating microscope was brought into the field to provide light and magnification.  Using microsurgical technique I dissected the lateral and ventral epidural space to free the thecal sac.  I palpated and inspected and did not identify any disc bulges herniations.  The nerves were well decompressed along their course.  I checked a valsalva and there was no CSF leak.  I irrigated vigorously then sealed the repaired durotomy with Adherus.  There was excellent hemostasis. I placed vancomycin in the depths of the wound.  The wound was then closed in routine anatomic layers with interrupted vicryl suture. I injected exparel in the paraspinous muscles. The skin was closed with a running Monocryl strata fix suture. It was then sealed with Dermabond. The patient was turned to the supine position and allowed to awaken from anesthesia.  PATIENT DISPOSITION:  PACU - hemodynamically stable.   Delay start of Pharmacological VTE agent (>24hrs) due to surgical blood loss or risk of bleeding:  yes

## 2016-12-25 NOTE — Progress Notes (Signed)
PT Cancellation Note  Patient Details Name: Jorge [name] MRN: [id number] DOB: 06-03-1951   Cancelled Treatment:    Reason Eval/Treat Not Completed: Medical issues which prohibited therapy (Pt on bedrest until am.  Will return in am.  Thanks.)   Denice Paradise 12/25/2016, 3:23 PM  Sitlali Koerner,PT Acute Rehabilitation [phone] (pager)

## 2016-12-25 NOTE — Anesthesia Preprocedure Evaluation (Signed)
Anesthesia Evaluation  Patient identified by MRN, date of birth, ID band Patient awake    Reviewed: Allergy & Precautions, H&P , NPO status , Patient's Chart, lab work & pertinent test results  Airway Mallampati: II   Neck ROM: full    Dental   Pulmonary former smoker,    breath sounds clear to auscultation       Cardiovascular hypertension,  Rhythm:regular Rate:Normal     Neuro/Psych    GI/Hepatic   Endo/Other  diabetes, Type 2  Renal/GU      Musculoskeletal   Abdominal   Peds  Hematology   Anesthesia Other Findings   Reproductive/Obstetrics                             Anesthesia Physical Anesthesia Plan  ASA: II  Anesthesia Plan: General   Post-op Pain Management:    Induction: Intravenous  Airway Management Planned: Oral ETT  Additional Equipment:   Intra-op Plan:   Post-operative Plan: Extubation in OR  Informed Consent: I have reviewed the patients History and Physical, chart, labs and discussed the procedure including the risks, benefits and alternatives for the proposed anesthesia with the patient or authorized representative who has indicated his/her understanding and acceptance.     Plan Discussed with: CRNA, Anesthesiologist and Surgeon  Anesthesia Plan Comments:         Anesthesia Quick Evaluation

## 2016-12-25 NOTE — H&P (Signed)
CC:  No chief complaint on file.   HPI: Jorge [name] is a 65 y.o. male with lumbosacral spondylosis and disc bulges with lumbar radiculopathy presents for elective L3-4, L4-5 laminectomy and right sided discectomies.  He complains of intractable right sided pain.  History obtained with interpreter.  PMH: Past Medical History:  Diagnosis Date  . Diabetes mellitus without complication ([place])   . Hypertension     PSH: History reviewed. No pertinent surgical history.  SH: Social History  Substance Use Topics  . Smoking status: Former Smoker    Packs/day: 0.25    Years: 40.00    Quit date: 12/17/2011  . Smokeless tobacco: Never Used  . Alcohol use No    MEDS: Prior to Admission medications   Medication Sig Start Date End Date Taking? Authorizing Provider  amLODipine (NORVASC) 5 MG tablet Take 5 mg by mouth daily. 07/16/16  Yes Historical Provider, MD  docusate sodium (COLACE) 100 MG capsule Take 1 capsule (100 mg total) by mouth every 12 (twelve) hours. 11/25/16  Yes Charlesetta Shanks, MD  hydrochlorothiazide (HYDRODIURIL) 12.5 MG tablet Take 12.5 mg by mouth daily. 08/03/16  Yes Historical Provider, MD  morphine (MSIR) 15 MG tablet Take 1 tablet (15 mg total) by mouth every 6 (six) hours as needed for severe pain. 12/13/16  Yes Recardo Evangelist, PA-C  polyethylene glycol [facility]) packet Take 17 g by mouth daily. 12/10/16  Yes Merryl Hacker, MD  diazepam (VALIUM) 10 MG tablet Take 1 tablet (10 mg total) by mouth every 8 (eight) hours as needed (muscle spasm). 11/28/16   Daleen Bo, MD  naproxen (NAPROSYN) 375 MG tablet Take 1 tablet (375 mg total) by mouth 2 (two) times daily. 11/25/16   Charlesetta Shanks, MD    ALLERGY: Allergies  Allergen Reactions  . No Known Allergies     ROS: Review of Systems  Musculoskeletal: Positive for back pain.  All other systems reviewed and are negative.   NEUROLOGIC EXAM: Awake, alert, oriented Memory and concentration grossly intact Speech  fluent, appropriate CN grossly intact Motor exam: Upper Extremities Deltoid Bicep Tricep Grip  Right 5/5 5/5 5/5 5/5  Left 5/5 5/5 5/5 5/5   Lower Extremity IP Quad PF DF EHL  Right 5/5 5/5 5/5 5/5 5/5  Left 5/5 5/5 5/5 5/5 5/5   Sensation grossly intact to LT  IMAGING: No new imaging  IMPRESSION: - 65 y.o. male with lumbosacral spondylosis and radiculopathy.  PLAN: - L3-5 laminectomies for decompression with right sided discectomies - We have had a long discussion about the risks and benefits of surgery and he wishes to proceed.  We have also discussed alternatives to surgery.

## 2016-12-25 NOTE — Transfer of Care (Signed)
Immediate Anesthesia Transfer of Care Note  Patient: Jorge [name]  Procedure(s) Performed: Procedure(s): Lumbar three-five Laminectomy, Right Lumbar three-four, Lumbar four-five Diskectomy  (Right)  Patient Location: PACU  Anesthesia Type:General  Level of Consciousness: awake, sedated, patient cooperative and responds to stimulation  Airway & Oxygen Therapy: Patient Spontanous Breathing and Patient connected to nasal cannula oxygen  Post-op Assessment: Report given to RN, Post -op Vital signs reviewed and stable, Patient moving all extremities and Patient moving all extremities X 4  Post vital signs: Reviewed and stable  Last Vitals:  Vitals:   12/25/16 0600  BP: (!) 171/91  Pulse: 79  Resp: 18  Temp: 36.7 C    Last Pain:  Vitals:   12/25/16 0600  TempSrc: Oral      Patients Stated Pain Goal: 4 ([id number])  Complications: No apparent anesthesia complications

## 2016-12-26 ENCOUNTER — Encounter (HOSPITAL_COMMUNITY): Payer: Self-pay | Admitting: Neurological Surgery

## 2016-12-26 DIAGNOSIS — M4727 Other spondylosis with radiculopathy, lumbosacral region: Secondary | ICD-10-CM | POA: Diagnosis not present

## 2016-12-26 LAB — GLUCOSE, CAPILLARY: Glucose-Capillary: 136 mg/dL — ABNORMAL HIGH (ref 65–99)

## 2016-12-26 MED ORDER — MORPHINE SULFATE 15 MG PO TABS: 15.0000 mg | ORAL_TABLET | Freq: Four times a day (QID) | ORAL | 0 refills | Status: DC | PRN

## 2016-12-26 MED ORDER — DIAZEPAM 10 MG PO TABS: 10.0000 mg | ORAL_TABLET | Freq: Three times a day (TID) | ORAL | 0 refills | Status: DC | PRN

## 2016-12-26 MED ORDER — DIPHENHYDRAMINE HCL 25 MG PO CAPS
25.0000 mg | ORAL_CAPSULE | Freq: Four times a day (QID) | ORAL | Status: DC | PRN
  Filled 2016-12-26: qty 1

## 2016-12-26 NOTE — Discharge Summary (Signed)
Physician Discharge Summary  Patient ID: Jorge [name] MRN: [id number] DOB/AGE: 02-10-1951 65 y.o.  Admit date: 12/25/2016 Discharge date: 12/26/2016  Admission Diagnoses:  Discharge Diagnoses:  Active Problems:   Lumbosacral spondylosis with radiculopathy   Discharged Condition: good  Hospital Course: Patient admitted to the hospital where he underwent an uncomplicated lumbar decompression. Postoperatively he is doing well. Ambulating without difficulty. Preoperative pain improved. Patient ready for discharge home.  Consults:   Significant Diagnostic Studies:   Treatments:   Discharge Exam: Blood pressure 114/69, pulse 63, temperature 98 F (36.7 C), temperature source Oral, resp. rate 18, weight 78.5 kg (173 lb), SpO2 93 %. Awake and alert. Oriented and appropriate. Motor and sensory function stable. Wound clean and dry. Chest and abdomen benign.  Disposition: 01-Home or Self Care   Allergies as of 12/26/2016      Reactions   No Known Allergies       Medication List    TAKE these medications   amLODipine 5 MG tablet Commonly known as:  NORVASC Take 5 mg by mouth daily.   diazepam 10 MG tablet Commonly known as:  VALIUM Take 1 tablet (10 mg total) by mouth every 8 (eight) hours as needed (muscle spasm).   docusate sodium 100 MG capsule Commonly known as:  COLACE Take 1 capsule (100 mg total) by mouth every 12 (twelve) hours.   hydrochlorothiazide 12.5 MG tablet Commonly known as:  HYDRODIURIL Take 12.5 mg by mouth daily.   morphine 15 MG tablet Commonly known as:  MSIR Take 1 tablet (15 mg total) by mouth every 6 (six) hours as needed for severe pain.   naproxen 375 MG tablet Commonly known as:  NAPROSYN Take 1 tablet (375 mg total) by mouth 2 (two) times daily.   polyethylene glycol packet Commonly known as:  MIRALAX Take 17 g by mouth daily.        Signed: Eliezer Khawaja A 12/26/2016, 8:36 AM

## 2016-12-26 NOTE — Evaluation (Signed)
Occupational Therapy Evaluation and Discharge Patient Details Name: Jorge [name] MRN: [id number] DOB: Sep 01, 1951 Today's Date: 12/26/2016    History of Present Illness Pt is a [age] y/o male presenting post-op L3-5 laminectomy for decompression.  has a past medical history of Diabetes mellitus without complication  and Hypertension.   Clinical Impression   PTA Pt independent in ADL and mobility. Pt currently mod I/supervision for ADL and mobility with quad cane. Pt fully assessed and received all education, including "BLT" handout reviewed in full. Son acting as Optometrist this session. Please see performance level below. Pt and son with no further questions or concerns. OT to sign off at this time. Thank you for this referral.     Follow Up Recommendations  No OT follow up;Supervision - Intermittent    Equipment Recommendations  None recommended by OT    Recommendations for Other Services       Precautions / Restrictions Precautions Precautions: Back Precaution Booklet Issued: Yes (comment) Precaution Comments: reviewed in full with Pt and son (acting as Optometrist) Restrictions Weight Bearing Restrictions: No      Mobility Bed Mobility Overal bed mobility: Needs Assistance Bed Mobility: Supine to Sit;Sit to Supine     Supine to sit: Supervision;HOB elevated Sit to supine: HOB elevated;Supervision   General bed mobility comments: Pt educated in rolling technique for precautions, but Pt able to use bed rails to bring trunk up while maintaining alignment with no twisting or bending.  Transfers Overall transfer level: Needs assistance Equipment used: Quad cane Transfers: Sit to/from Stand Sit to Stand: Supervision         General transfer comment: no assist needed. Pt able to maintain precaution    Balance Overall balance assessment: No apparent balance deficits (not formally assessed)                                          ADL Overall ADL's :  Modified independent                                       General ADL Comments: Pt's son reported that the Pt got dressed all by himself bed level. Pt able to don/doff socks at bed level and not bend past 90. Pt also observed Pt doing sink level grooming with no twisting or bending. Pt and father had no questions or further concerns for OT. OT reinforce importance of maintaining precautions in the kitchen and bathroom especially.     Vision     Perception     Praxis      Pertinent Vitals/Pain Pain Assessment: No/denies pain     Hand Dominance Right   Extremity/Trunk Assessment Upper Extremity Assessment Upper Extremity Assessment: Overall WFL for tasks assessed   Lower Extremity Assessment Lower Extremity Assessment: Overall WFL for tasks assessed   Cervical / Trunk Assessment Cervical / Trunk Assessment: Other exceptions Cervical / Trunk Exceptions: post-op back surgery   Communication Communication Communication: Prefers language other than [place];Other (comment) (son acting as Optometrist)   Cognition Arousal/Alertness: Awake/alert Behavior During Therapy: WFL for tasks assessed/performed Overall Cognitive Status: Within Functional Limits for tasks assessed                     General Comments       Exercises  Shoulder Instructions      Home Living Family/patient expects to be discharged to:: Private residence Living Arrangements: Spouse/significant other;Children Available Help at Discharge: Family;Available 24 hours/day Type of Home: House Home Access: Level entry     Home Layout: One level     Bathroom Shower/Tub: Tub/shower unit;Walk-in shower (uses both) Shower/tub characteristics: Scientist, water quality: None          Prior Functioning/Environment Level of Independence: Independent                 OT Problem List: Decreased knowledge of precautions   OT  Treatment/Interventions:      OT Goals(Current goals can be found in the care plan section) Acute Rehab OT Goals Patient Stated Goal: to get home  OT Goal Formulation: With patient/family Time For Goal Achievement: 01/02/17 Potential to Achieve Goals: Good  OT Frequency:     Barriers to D/C:            Co-evaluation              End of Session Equipment Utilized During Treatment: Gait belt;Other (comment) (quad cane) Nurse Communication: Mobility status;Other (comment) (no DME needs)  Activity Tolerance: Patient tolerated treatment well Patient left: in bed;with call bell/phone within reach;with family/visitor present   Time: [id number] OT Time Calculation (min): 17 min Charges:  OT General Charges $OT Visit: 1 Procedure OT Evaluation $OT Eval Low Complexity: 1 Procedure G-Codes: OT G-codes **NOT FOR INPATIENT CLASS** Functional Assessment Tool Used: Clinical Judgement Functional Limitation: Self care Self Care Current Status [id number]): At least 20 percent but less than 40 percent impaired, limited or restricted Self Care Goal Status [id number]): At least 1 percent but less than 20 percent impaired, limited or restricted Self Care Discharge Status [phone]): At least 20 percent but less than 40 percent impaired, limited or restricted  Jaci Carrel 12/26/2016, 8:57 AM Hulda Humphrey OTR/L [phone]

## 2016-12-26 NOTE — Progress Notes (Signed)
Pt doing well. Pt and son given D/C instructions with verbal understanding. Pt's incision is clean and dry with no sign of infection. Pt's IV was removed prior to D/C. Pt D/C'd home via wheelchair @ 0900 per MD order. Pt is stable @ D/C and has no other needs at this time. Holli Humbles, RN

## 2016-12-28 ENCOUNTER — Emergency Department (HOSPITAL_COMMUNITY)
Admission: EM | Admit: 2016-12-28 | Discharge: 2016-12-28 | Disposition: A | Discharge status: Home / Self Care | Payer: Medicaid Other | Attending: Emergency Medicine | Admitting: Emergency Medicine

## 2016-12-28 ENCOUNTER — Emergency Department (HOSPITAL_COMMUNITY): Payer: Medicaid Other

## 2016-12-28 ENCOUNTER — Encounter (HOSPITAL_COMMUNITY): Payer: Self-pay | Admitting: *Deleted

## 2016-12-28 DIAGNOSIS — I10 Essential (primary) hypertension: Secondary | ICD-10-CM | POA: Diagnosis not present

## 2016-12-28 DIAGNOSIS — Z87891 Personal history of nicotine dependence: Secondary | ICD-10-CM | POA: Insufficient documentation

## 2016-12-28 DIAGNOSIS — Z79899 Other long term (current) drug therapy: Secondary | ICD-10-CM | POA: Insufficient documentation

## 2016-12-28 DIAGNOSIS — Z9889 Other specified postprocedural states: Secondary | ICD-10-CM | POA: Insufficient documentation

## 2016-12-28 DIAGNOSIS — R221 Localized swelling, mass and lump, neck: Secondary | ICD-10-CM

## 2016-12-28 DIAGNOSIS — E119 Type 2 diabetes mellitus without complications: Secondary | ICD-10-CM | POA: Insufficient documentation

## 2016-12-28 LAB — COMPREHENSIVE METABOLIC PANEL
ALBUMIN: 3.5 g/dL (ref 3.5–5.0)
ALK PHOS: 54 U/L (ref 38–126)
ALT: 19 U/L (ref 17–63)
AST: 26 U/L (ref 15–41)
Anion gap: 7 (ref 5–15)
BUN: 7 mg/dL (ref 6–20)
CALCIUM: 8.8 mg/dL — AB (ref 8.9–10.3)
CHLORIDE: 109 mmol/L (ref 101–111)
CO2: 24 mmol/L (ref 22–32)
CREATININE: 0.81 mg/dL (ref 0.61–1.24)
GFR calc Af Amer: >60 mL/min (ref >60)
GFR calc non Af Amer: >60 mL/min (ref >60)
GLUCOSE: 110 mg/dL — AB (ref 65–99)
Potassium: 3.5 mmol/L (ref 3.5–5.1)
SODIUM: 140 mmol/L (ref 135–145)
Total Bilirubin: 0.6 mg/dL (ref 0.3–1.2)
Total Protein: 6.3 g/dL — ABNORMAL LOW (ref 6.5–8.1)

## 2016-12-28 LAB — CBC WITH DIFFERENTIAL/PLATELET
BASOS PCT: 1 %
Basophils Absolute: 0 10*3/uL (ref 0.0–0.1)
EOS ABS: 0 10*3/uL (ref 0.0–0.7)
EOS PCT: 0 %
HCT: 37.4 % — ABNORMAL LOW (ref 39.0–52.0)
HEMOGLOBIN: 13 g/dL (ref 13.0–17.0)
Lymphocytes Relative: 28 %
Lymphs Abs: 1.6 10*3/uL (ref 0.7–4.0)
MCH: 30 pg (ref 26.0–34.0)
MCHC: 34.8 g/dL (ref 30.0–36.0)
MCV: 86.4 fL (ref 78.0–100.0)
Monocytes Absolute: 1 10*3/uL (ref 0.1–1.0)
Monocytes Relative: 18 %
NEUTROS PCT: 53 %
Neutro Abs: 3 10*3/uL (ref 1.7–7.7)
PLATELETS: 154 10*3/uL (ref 150–400)
RBC: 4.33 MIL/uL (ref 4.22–5.81)
RDW: 12.4 % (ref 11.5–15.5)
WBC: 5.7 10*3/uL (ref 4.0–10.5)

## 2016-12-28 MED ORDER — FAMOTIDINE IN NACL 20-0.9 MG/50ML-% IV SOLN
20.0000 mg | Freq: Once | INTRAVENOUS | Status: AC
  Administered 2016-12-28: 20 mg via INTRAVENOUS
  Filled 2016-12-28: qty 50

## 2016-12-28 MED ORDER — SODIUM CHLORIDE 0.9 % IV BOLUS (SEPSIS)
1000.0000 mL | Freq: Once | INTRAVENOUS | Status: AC
  Administered 2016-12-28: 1000 mL via INTRAVENOUS

## 2016-12-28 MED ORDER — METHYLPREDNISOLONE SODIUM SUCC 125 MG IJ SOLR
60.0000 mg | Freq: Once | INTRAMUSCULAR | Status: AC
  Administered 2016-12-28: 60 mg via INTRAVENOUS
  Filled 2016-12-28: qty 2

## 2016-12-28 MED ORDER — IOPAMIDOL (ISOVUE-300) INJECTION 61%
INTRAVENOUS | Status: AC
  Administered 2016-12-28: 75 mL
  Filled 2016-12-28: qty 75

## 2016-12-28 MED ORDER — METHYLPREDNISOLONE SODIUM SUCC 125 MG IJ SOLR: 125.0000 mg | Freq: Once | INTRAMUSCULAR | Status: DC

## 2016-12-28 MED ORDER — DIPHENHYDRAMINE HCL 50 MG/ML IJ SOLN
25.0000 mg | Freq: Once | INTRAMUSCULAR | Status: AC
  Administered 2016-12-28: 25 mg via INTRAVENOUS
  Filled 2016-12-28: qty 1

## 2016-12-28 NOTE — ED Notes (Signed)
Pt stable, ambulatory, states understanding of discharge instructions 

## 2016-12-28 NOTE — ED Provider Notes (Signed)
  Physical Exam  BP 136/74   Pulse 62   Temp 97.7 F (36.5 C) (Oral)   Resp 21   Ht 5\' 2"  (1.575 m)   Wt 173 lb (78.5 kg)   SpO2 94%   BMI 31.64 kg/m   Physical Exam  ED Course  Procedures  MDM CT shows possible submandibular edema. Infection question but there is no skin changes, good teeth, and no salivary gland involvement. No infection of floor of mouth. Will discharge home to follow-up as needed.       Davonna Belling, MD 12/28/16 [id number]

## 2016-12-28 NOTE — ED Triage Notes (Signed)
Family states Dr Cyndy Freeze performed back surgery 12/29 on pt with no complications.  Today when pt woke up pt's ant neck was swollen.  Denies difficultly breathing or swallowing, however, states pain when he tries to open his mouth.

## 2016-12-28 NOTE — ED Provider Notes (Signed)
[facility] DEPT Provider Note   CSN: [id number] Arrival date & time: 12/28/16  1154     History   Chief Complaint Chief Complaint  Patient presents with  . Post-op Problem    HPI Jorge [name] is a 66 y.o. male.  Level V caveat for language barrier.  Most of history obtained from son. Patient is status post L3-4-5 laminectomy for lumbar decompression secondary to lumbosacral spondylosis with radiculopathy. He has done well postoperatively. Today he presents with swelling in his chin.  He is able to chew and swallow. Son states he has been taking tramadol and Valium. No fever, sweats, chills, significant low back pain. Severity of symptoms mild to moderate.      Past Medical History:  Diagnosis Date  . Diabetes mellitus without complication ([place])   . Hypertension     Patient Active Problem List   Diagnosis Date Noted  . Lumbosacral spondylosis with radiculopathy 12/25/2016    Past Surgical History:  Procedure Laterality Date  . LUMBAR LAMINECTOMY/DECOMPRESSION MICRODISCECTOMY Right 12/25/2016   Procedure: Lumbar three-five Laminectomy, Right Lumbar three-four, Lumbar four-five Diskectomy ;  Surgeon: Kevan Ny Ditty, MD;  Location: [facility];  Service: Neurosurgery;  Laterality: Right;       Home Medications    Prior to Admission medications   Medication Sig Start Date End Date Taking? Authorizing Provider  amLODipine (NORVASC) 5 MG tablet Take 5 mg by mouth daily. 07/16/16  Yes Historical Provider, MD  diazepam (VALIUM) 10 MG tablet Take 1 tablet (10 mg total) by mouth every 8 (eight) hours as needed (muscle spasm). 12/26/16  Yes Earnie Larsson, MD  polyethylene glycol [facility]) packet Take 17 g by mouth daily. 12/10/16  Yes Merryl Hacker, MD  docusate sodium (COLACE) 100 MG capsule Take 1 capsule (100 mg total) by mouth every 12 (twelve) hours. Patient not taking: Reported on 12/28/2016 11/25/16   Charlesetta Shanks, MD  hydrochlorothiazide (HYDRODIURIL) 12.5 MG tablet  Take 12.5 mg by mouth daily. 08/03/16   Historical Provider, MD  morphine (MSIR) 15 MG tablet Take 1 tablet (15 mg total) by mouth every 6 (six) hours as needed for severe pain. 12/26/16   Earnie Larsson, MD  naproxen (NAPROSYN) 375 MG tablet Take 1 tablet (375 mg total) by mouth 2 (two) times daily. 11/25/16   Charlesetta Shanks, MD    Family History No family history on file.  Social History Social History  Substance Use Topics  . Smoking status: Former Smoker    Packs/day: 0.25    Years: 40.00    Quit date: 12/17/2011  . Smokeless tobacco: Never Used  . Alcohol use No     Allergies   No known allergies   Review of Systems Review of Systems  Reason unable to perform ROS: Language barrier.     Physical Exam Updated Vital Signs BP 141/79   Pulse 63   Temp 97.7 F (36.5 C) (Oral)   Resp 18   Ht 5\' 2"  (1.575 m)   Wt 173 lb (78.5 kg)   SpO2 96%   BMI 31.64 kg/m   Physical Exam  Constitutional: He is oriented to person, place, and time. He appears well-developed and well-nourished.  HENT:  Head: Normocephalic and atraumatic.  Soft tissue swelling in the chin. Oral pharyngeal area intact.  Eyes: Conjunctivae are normal.  Neck: Neck supple.  Cardiovascular: Normal rate and regular rhythm.   Pulmonary/Chest: Effort normal and breath sounds normal.  Abdominal: Soft. Bowel sounds are normal.  Musculoskeletal:  Surgical wound on lumbar spine is healing well. No erythema or swelling.  Neurological: He is alert and oriented to person, place, and time.  Skin: Skin is warm and dry.  Psychiatric: He has a normal mood and affect. His behavior is normal.  Nursing note and vitals reviewed.    ED Treatments / Results  Labs (all labs ordered are listed, but only abnormal results are displayed) Labs Reviewed  CBC WITH DIFFERENTIAL/PLATELET - Abnormal; Notable for the following:       Result Value   HCT 37.4 (*)    All other components within normal limits  COMPREHENSIVE  METABOLIC PANEL - Abnormal; Notable for the following:    Glucose, Bld 110 (*)    Calcium 8.8 (*)    Total Protein 6.3 (*)    All other components within normal limits    EKG  EKG Interpretation None       Radiology No results found.  Procedures Procedures (including critical care time)  Medications Ordered in ED Medications  sodium chloride 0.9 % bolus 1,000 mL (0 mLs Intravenous Stopped 12/28/16 1533)  diphenhydrAMINE (BENADRYL) injection 25 mg (25 mg Intravenous Given 12/28/16 1359)  famotidine (PEPCID) IVPB 20 mg premix (0 mg Intravenous Stopped 12/28/16 1433)  methylPREDNISolone sodium succinate (SOLU-MEDROL) 125 mg/2 mL injection 60 mg (60 mg Intravenous Given 12/28/16 1359)     Initial Impression / Assessment and Plan / ED Course  I have reviewed the triage vital signs and the nursing notes.  Pertinent labs & imaging results that were available during my care of the patient were reviewed by me and considered in my medical decision making (see chart for details).  Clinical Course     Uncertain etiology of swelling in the neck. Could be medication related or soft tissue trauma secondary to intubation. He responded well to IV Solumedrol, Benadryl, Pepcid. CT soft tissue neck pending. Recommend stopping tramadol and Valium. Patient has oral morphine at home.  Disc c Dr Alvino Chapel  Final Clinical Impressions(s) / ED Diagnoses   Final diagnoses:  Neck swelling    New Prescriptions New Prescriptions   No medications on file     Nat Christen, MD 12/28/16 1605

## 2016-12-28 NOTE — Discharge Instructions (Signed)
Take morphine for pain.  Ice pack to neck area. Return if worse.

## 2017-01-21 MED FILL — Thrombin For Soln 5000 Unit: CUTANEOUS | Qty: 5000 | Status: AC

## 2017-03-21 ENCOUNTER — Encounter (HOSPITAL_COMMUNITY): Payer: Self-pay | Admitting: Emergency Medicine

## 2017-03-21 ENCOUNTER — Emergency Department (HOSPITAL_COMMUNITY): Payer: Medicaid Other

## 2017-03-21 ENCOUNTER — Emergency Department (HOSPITAL_COMMUNITY)
Admission: EM | Admit: 2017-03-21 | Discharge: 2017-03-21 | Disposition: A | Discharge status: Home / Self Care | Payer: Medicaid Other | Attending: Emergency Medicine | Admitting: Emergency Medicine

## 2017-03-21 DIAGNOSIS — E119 Type 2 diabetes mellitus without complications: Secondary | ICD-10-CM | POA: Insufficient documentation

## 2017-03-21 DIAGNOSIS — I10 Essential (primary) hypertension: Secondary | ICD-10-CM | POA: Insufficient documentation

## 2017-03-21 DIAGNOSIS — R1013 Epigastric pain: Secondary | ICD-10-CM

## 2017-03-21 DIAGNOSIS — Z87891 Personal history of nicotine dependence: Secondary | ICD-10-CM | POA: Insufficient documentation

## 2017-03-21 DIAGNOSIS — R1011 Right upper quadrant pain: Secondary | ICD-10-CM | POA: Diagnosis present

## 2017-03-21 LAB — CBC
HCT: 42.5 % (ref 39.0–52.0)
HEMOGLOBIN: 15.2 g/dL (ref 13.0–17.0)
MCH: 30.5 pg (ref 26.0–34.0)
MCHC: 35.8 g/dL (ref 30.0–36.0)
MCV: 85.2 fL (ref 78.0–100.0)
Platelets: 143 10*3/uL — ABNORMAL LOW (ref 150–400)
RBC: 4.99 MIL/uL (ref 4.22–5.81)
RDW: 12.2 % (ref 11.5–15.5)
WBC: 5 10*3/uL (ref 4.0–10.5)

## 2017-03-21 LAB — URINALYSIS, ROUTINE W REFLEX MICROSCOPIC
Bilirubin Urine: NEGATIVE
GLUCOSE, UA: NEGATIVE mg/dL
HGB URINE DIPSTICK: NEGATIVE
Ketones, ur: NEGATIVE mg/dL
Leukocytes, UA: NEGATIVE
Nitrite: NEGATIVE
Protein, ur: NEGATIVE mg/dL
SPECIFIC GRAVITY, URINE: 1.008 (ref 1.005–1.030)
pH: 6 (ref 5.0–8.0)

## 2017-03-21 LAB — COMPREHENSIVE METABOLIC PANEL
ALBUMIN: 3.9 g/dL (ref 3.5–5.0)
ALT: 24 U/L (ref 17–63)
ANION GAP: 10 (ref 5–15)
AST: 26 U/L (ref 15–41)
Alkaline Phosphatase: 96 U/L (ref 38–126)
BILIRUBIN TOTAL: 1.2 mg/dL (ref 0.3–1.2)
BUN: 8 mg/dL (ref 6–20)
CO2: 23 mmol/L (ref 22–32)
Calcium: 9.1 mg/dL (ref 8.9–10.3)
Chloride: 108 mmol/L (ref 101–111)
Creatinine, Ser: 0.81 mg/dL (ref 0.61–1.24)
GFR calc Af Amer: >60 mL/min (ref >60)
GFR calc non Af Amer: >60 mL/min (ref >60)
GLUCOSE: 113 mg/dL — AB (ref 65–99)
Potassium: 3.5 mmol/L (ref 3.5–5.1)
SODIUM: 141 mmol/L (ref 135–145)
Total Protein: 6.6 g/dL (ref 6.5–8.1)

## 2017-03-21 LAB — TROPONIN I

## 2017-03-21 LAB — LIPASE, BLOOD: Lipase: 10 U/L — ABNORMAL LOW (ref 11–51)

## 2017-03-21 MED ORDER — GI COCKTAIL ~~LOC~~
30.0000 mL | Freq: Once | ORAL | Status: AC
Start: 2017-03-21 — End: 2017-03-21
  Administered 2017-03-21: 30 mL via ORAL
  Filled 2017-03-21: qty 30

## 2017-03-21 MED ORDER — OMEPRAZOLE 20 MG PO CPDR: 20.0000 mg | DELAYED_RELEASE_CAPSULE | Freq: Every day | ORAL | 0 refills | Status: DC

## 2017-03-21 NOTE — ED Triage Notes (Signed)
Pt from home with c/o RUQ, epigastric pain radiating burning to his esophagus starting yesterday.  Pt denies N/V/D.  NAD, A&O.

## 2017-03-21 NOTE — ED Notes (Signed)
Pt transported to US

## 2017-03-21 NOTE — Discharge Instructions (Signed)
FOLLOW UP WITH YOUR CLINIC DOCTOR AFTER YOU START THE NEW MEDICATION. RETURN TO EMERGENCY DEPARTMENT IF YOU HAVE SEVERE WORSENING OF PAIN, VOMITING, OR FEVER.

## 2017-03-21 NOTE — ED Notes (Signed)
Pt does not speak english. Son at bedside translating.

## 2017-03-21 NOTE — ED Provider Notes (Signed)
[facility] DEPT Provider Note   CSN: [id number] Arrival date & time: 03/21/17 0840     History    Chief Complaint  Patient presents with  . Abdominal Pain     HPI Jorge [name] is a 66 y.o. male.  66yo M w/ PMH including HTN and T2DM who p/w abdominal pain. Pt speaks [place] and hx obtained w/ assistance of pt's son and interpreter. Pt has had a few days of intermittent upper abdominal pain that has been worse since yesterday. Patient's pain is in right upper abdomen and midepigastric area and when it becomes severe it radiates to his back and up his chest. The pain is not associated with eating and also not associated with exertion. He states he becomes mildly short of breath when the pain is severe. Nothing makes the pain better. He has not taken any medications for the pain. He denies any associated nausea, vomiting, diarrhea, or urinary symptoms. No fevers. No alcohol or NSAID use.   Past Medical History:  Diagnosis Date  . Diabetes mellitus without complication ([place])   . Hypertension      Patient Active Problem List   Diagnosis Date Noted  . Lumbosacral spondylosis with radiculopathy 12/25/2016    Past Surgical History:  Procedure Laterality Date  . LUMBAR LAMINECTOMY/DECOMPRESSION MICRODISCECTOMY Right 12/25/2016   Procedure: Lumbar three-five Laminectomy, Right Lumbar three-four, Lumbar four-five Diskectomy ;  Surgeon: Kevan Ny Ditty, MD;  Location: [facility];  Service: Neurosurgery;  Laterality: Right;        Home Medications    Prior to Admission medications   Medication Sig Start Date End Date Taking? Authorizing Provider  amLODipine (NORVASC) 5 MG tablet Take 5 mg by mouth daily. 07/16/16   Historical Provider, MD  diazepam (VALIUM) 10 MG tablet Take 1 tablet (10 mg total) by mouth every 8 (eight) hours as needed (muscle spasm). 12/26/16   Earnie Larsson, MD  docusate sodium (COLACE) 100 MG capsule Take 1 capsule (100 mg total) by mouth every 12 (twelve)  hours. Patient not taking: Reported on 12/28/2016 11/25/16   Charlesetta Shanks, MD  morphine (MSIR) 15 MG tablet Take 1 tablet (15 mg total) by mouth every 6 (six) hours as needed for severe pain. 12/26/16   Earnie Larsson, MD  naproxen (NAPROSYN) 375 MG tablet Take 1 tablet (375 mg total) by mouth 2 (two) times daily. Patient not taking: Reported on 12/28/2016 11/25/16   Charlesetta Shanks, MD  omeprazole (PRILOSEC) 20 MG capsule Take 1 capsule (20 mg total) by mouth daily. 03/21/17   Sharlett Iles, MD  polyethylene glycol [facility]) packet Take 17 g by mouth daily. 12/10/16   Merryl Hacker, MD  traMADol (ULTRAM) 50 MG tablet Take 50 mg by mouth every 6 (six) hours as needed (pain).  12/17/16   Historical Provider, MD      History reviewed. No pertinent family history.   Social History  Substance Use Topics  . Smoking status: Former Smoker    Packs/day: 0.25    Years: 40.00    Quit date: 12/17/2011  . Smokeless tobacco: Never Used  . Alcohol use No     Allergies     No known allergies    Review of Systems  10 Systems reviewed and are negative for acute change except as noted in the HPI.   Physical Exam Updated Vital Signs BP 138/75   Pulse 64   Temp 98.1 F (36.7 C) (Oral)   Resp (!) 22   Ht 5'  2" (1.575 m)   Wt 165 lb (74.8 kg)   SpO2 95%   BMI 30.18 kg/m   Physical Exam  Constitutional: He is oriented to person, place, and time. He appears well-developed and well-nourished. No distress.  HENT:  Head: Normocephalic and atraumatic.  Mouth/Throat: Oropharynx is clear and moist.  Moist mucous membranes  Eyes: Conjunctivae are normal. Pupils are equal, round, and reactive to light.  Neck: Neck supple.  Cardiovascular: Normal rate, regular rhythm and normal heart sounds.   No murmur heard. Pulmonary/Chest: Effort normal and breath sounds normal.  Abdominal: Soft. Bowel sounds are normal. He exhibits no distension. There is tenderness in the right upper quadrant  and epigastric area. There is no rebound and no guarding.  Musculoskeletal: He exhibits no edema.  Neurological: He is alert and oriented to person, place, and time.  Fluent speech  Skin: Skin is warm and dry.  Psychiatric: He has a normal mood and affect. Judgment normal.  Nursing note and vitals reviewed.     ED Treatments / Results  Labs (all labs ordered are listed, but only abnormal results are displayed) Labs Reviewed  LIPASE, BLOOD - Abnormal; Notable for the following:       Result Value   Lipase 10 (*)    All other components within normal limits  COMPREHENSIVE METABOLIC PANEL - Abnormal; Notable for the following:    Glucose, Bld 113 (*)    All other components within normal limits  CBC - Abnormal; Notable for the following:    Platelets 143 (*)    All other components within normal limits  URINALYSIS, ROUTINE W REFLEX MICROSCOPIC - Abnormal; Notable for the following:    Color, Urine STRAW (*)    All other components within normal limits  TROPONIN I     EKG  EKG Interpretation  Date/Time:  Sunday March 21 2017 09:42:37 EDT Ventricular Rate:  54 PR Interval:    QRS Duration: 90 QT Interval:  406 QTC Calculation: 385 R Axis:   48 Text Interpretation:  Sinus rhythm ST elevation, consider inferior injury No significant change since last tracing Confirmed by Carinne Brandenburger MD, Hadar Elgersma [id number]) on 03/21/2017 9:56:21 AM Also confirmed by Elliot Simoneaux MD, Joei Frangos [phone]), editor Drema Pry [phone])  on 03/21/2017 9:57:01 AM         Radiology US Abdomen Limited Ruq  Result Date: 03/21/2017 CLINICAL DATA:  Patient with right upper quadrant pain. EXAM: US ABDOMEN LIMITED - RIGHT UPPER QUADRANT COMPARISON:  CT abdomen pelvis 12/10/2016 FINDINGS: Gallbladder: No gallstones or wall thickening visualized. No sonographic Murphy sign noted by sonographer. Common bile duct: Diameter: 3 mm Liver: No focal lesion identified. Within normal limits in parenchymal echogenicity.  IMPRESSION: No cholelithiasis or sonographic evidence for acute cholecystitis. Electronically Signed   By: Lovey Newcomer M.D.   On: 03/21/2017 11:21    Procedures Procedures (including critical care time) Procedures  Medications Ordered in ED  Medications  gi cocktail (Maalox,Lidocaine,Donnatal) (30 mLs Oral Given 03/21/17 1145)     Initial Impression / Assessment and Plan / ED Course  I have reviewed the triage vital signs and the nursing notes.  Pertinent labs & imaging results that were available during my care of the patient were reviewed by me and considered in my medical decision making (see chart for details).    Pt w/ intermittent upper abdominal pain for the past several days, when severe radiates to her back and up into the chest. He was awake and alert, comfortable on exam.  He stated his pain was minimal currently. He had midepigastric and right upper quadrant tenderness. No rebound or peritonitis. Obtained above lab work and also obtained EKG given report that pain radiates into his chest, although given his tenderness on exam I feel cardiac etiology is less likely.  All labwork including troponin was unremarkable. Normal LFTs and lipase. EKG unchanged from previous. I did obtain a right upper quadrant ultrasound to evaluate gallbladder. No gallbladder pathology on ultrasound. After receiving GI cocktail, the patient stated that he was mildly improved. He was able to eat a meal with no problems. Given his reassuring lab work, reassuring vital signs, and no vomiting I feel he is safe for discharge. I have started him on PPI and instructed to follow-up with his PCP for reevaluation of his symptoms. Extensively reviewed return precautions with the assistance of the interpreter and patient and son voiced understanding. Patient discharged in satisfactory condition.  Final Clinical Impressions(s) / ED Diagnoses   Final diagnoses:  RUQ pain  Epigastric pain     Discharge Medication  List as of 03/21/2017  2:25 PM    START taking these medications   Details  omeprazole (PRILOSEC) 20 MG capsule Take 1 capsule (20 mg total) by mouth daily., Starting Sun 03/21/2017, Print           Sharlett Iles, MD 03/21/17 1700

## 2017-03-30 ENCOUNTER — Emergency Department (HOSPITAL_COMMUNITY): Payer: Medicaid Other

## 2017-03-30 ENCOUNTER — Emergency Department (HOSPITAL_COMMUNITY)
Admission: EM | Admit: 2017-03-30 | Discharge: 2017-03-30 | Disposition: A | Discharge status: Home / Self Care | Payer: Medicaid Other | Attending: Emergency Medicine | Admitting: Emergency Medicine

## 2017-03-30 ENCOUNTER — Encounter (HOSPITAL_COMMUNITY): Payer: Self-pay | Admitting: Emergency Medicine

## 2017-03-30 DIAGNOSIS — R1011 Right upper quadrant pain: Secondary | ICD-10-CM | POA: Insufficient documentation

## 2017-03-30 DIAGNOSIS — Z79899 Other long term (current) drug therapy: Secondary | ICD-10-CM | POA: Insufficient documentation

## 2017-03-30 LAB — COMPREHENSIVE METABOLIC PANEL
ALK PHOS: 108 U/L (ref 38–126)
ALT: 24 U/L (ref 17–63)
ANION GAP: 11 (ref 5–15)
AST: 27 U/L (ref 15–41)
Albumin: 4.3 g/dL (ref 3.5–5.0)
BILIRUBIN TOTAL: 1.2 mg/dL (ref 0.3–1.2)
BUN: 6 mg/dL (ref 6–20)
CALCIUM: 9.5 mg/dL (ref 8.9–10.3)
CO2: 24 mmol/L (ref 22–32)
Chloride: 106 mmol/L (ref 101–111)
Creatinine, Ser: 0.85 mg/dL (ref 0.61–1.24)
GFR calc Af Amer: >60 mL/min (ref >60)
GFR calc non Af Amer: >60 mL/min (ref >60)
Glucose, Bld: 118 mg/dL — ABNORMAL HIGH (ref 65–99)
POTASSIUM: 3.3 mmol/L — AB (ref 3.5–5.1)
Sodium: 141 mmol/L (ref 135–145)
Total Protein: 7 g/dL (ref 6.5–8.1)

## 2017-03-30 LAB — URINALYSIS, ROUTINE W REFLEX MICROSCOPIC
BILIRUBIN URINE: NEGATIVE
Glucose, UA: NEGATIVE mg/dL
Hgb urine dipstick: NEGATIVE
Ketones, ur: NEGATIVE mg/dL
LEUKOCYTES UA: NEGATIVE
NITRITE: NEGATIVE
PH: 7 (ref 5.0–8.0)
Protein, ur: NEGATIVE mg/dL
SPECIFIC GRAVITY, URINE: 1.002 — AB (ref 1.005–1.030)

## 2017-03-30 LAB — CBC
HEMATOCRIT: 43.4 % (ref 39.0–52.0)
HEMOGLOBIN: 15.4 g/dL (ref 13.0–17.0)
MCH: 29.7 pg (ref 26.0–34.0)
MCHC: 35.5 g/dL (ref 30.0–36.0)
MCV: 83.6 fL (ref 78.0–100.0)
Platelets: 157 10*3/uL (ref 150–400)
RBC: 5.19 MIL/uL (ref 4.22–5.81)
RDW: 12 % (ref 11.5–15.5)
WBC: 6.1 10*3/uL (ref 4.0–10.5)

## 2017-03-30 LAB — LIPASE, BLOOD: Lipase: 13 U/L (ref 11–51)

## 2017-03-30 MED ORDER — GI COCKTAIL ~~LOC~~
30.0000 mL | Freq: Once | ORAL | Status: AC
  Administered 2017-03-30: 30 mL via ORAL
  Filled 2017-03-30: qty 30

## 2017-03-30 MED ORDER — FAMOTIDINE 20 MG PO TABS: 20.0000 mg | ORAL_TABLET | Freq: Two times a day (BID) | ORAL | 0 refills | Status: DC

## 2017-03-30 MED ORDER — FAMOTIDINE 20 MG PO TABS
40.0000 mg | ORAL_TABLET | Freq: Once | ORAL | Status: AC
  Administered 2017-03-30: 40 mg via ORAL
  Filled 2017-03-30: qty 2

## 2017-03-30 MED ORDER — IOPAMIDOL (ISOVUE-300) INJECTION 61%
INTRAVENOUS | Status: AC
  Administered 2017-03-30: 100 mL
  Filled 2017-03-30: qty 100

## 2017-03-30 NOTE — ED Notes (Signed)
MD Regenia Skeeter at the bedside speaking about plan of care

## 2017-03-30 NOTE — ED Provider Notes (Signed)
[facility] DEPT Provider Note   CSN: [id number] Arrival date & time: 03/30/17  0127     History   Chief Complaint Chief Complaint  Patient presents with  . Abdominal Pain    HPI Jorge [name] is a 66 y.o. male.  Patient returns with continued right upper quadrant abdominal pain. Patient was seen in the ER April 1 with similar complaints. Workup including labs and ultrasound were normal. Patient has had continued pain since leaving the emergency department. No vomiting or diarrhea. No alleviating or exacerbating factors.      Past Medical History:  Diagnosis Date  . Diabetes mellitus without complication ([place])   . Hypertension     Patient Active Problem List   Diagnosis Date Noted  . Lumbosacral spondylosis with radiculopathy 12/25/2016    Past Surgical History:  Procedure Laterality Date  . LUMBAR LAMINECTOMY/DECOMPRESSION MICRODISCECTOMY Right 12/25/2016   Procedure: Lumbar three-five Laminectomy, Right Lumbar three-four, Lumbar four-five Diskectomy ;  Surgeon: Kevan Ny Ditty, MD;  Location: [facility];  Service: Neurosurgery;  Laterality: Right;       Home Medications    Prior to Admission medications   Medication Sig Start Date End Date Taking? Authorizing Provider  amLODipine (NORVASC) 5 MG tablet Take 5 mg by mouth daily. 07/16/16  Yes Historical Provider, MD  atorvastatin (LIPITOR) 40 MG tablet Take 40 mg by mouth daily.   Yes Historical Provider, MD  diazepam (VALIUM) 10 MG tablet Take 1 tablet (10 mg total) by mouth every 8 (eight) hours as needed (muscle spasm). 12/26/16  Yes Earnie Larsson, MD  omeprazole (PRILOSEC) 20 MG capsule Take 1 capsule (20 mg total) by mouth daily. 03/21/17  Yes Wenda Overland Little, MD  polyethylene glycol [facility]) packet Take 17 g by mouth daily. Patient taking differently: Take 17 g by mouth daily as needed for mild constipation.  12/10/16  Yes Merryl Hacker, MD  traMADol (ULTRAM) 50 MG tablet Take 50 mg by mouth every 6  (six) hours as needed (pain).  12/17/16  Yes Historical Provider, MD    Family History History reviewed. No pertinent family history.  Social History Social History  Substance Use Topics  . Smoking status: Former Smoker    Packs/day: 0.25    Years: 40.00    Quit date: 12/17/2011  . Smokeless tobacco: Never Used  . Alcohol use No     Allergies   No known allergies   Review of Systems Review of Systems  Gastrointestinal: Positive for abdominal pain.  All other systems reviewed and are negative.    Physical Exam Updated Vital Signs BP 136/84 (BP Location: Right Arm)   Pulse (!) 54   Temp 97.9 F (36.6 C) (Oral)   Resp 20   SpO2 96%   Physical Exam  Constitutional: He is oriented to person, place, and time. He appears well-developed and well-nourished. No distress.  HENT:  Head: Normocephalic and atraumatic.  Right Ear: Hearing normal.  Left Ear: Hearing normal.  Nose: Nose normal.  Mouth/Throat: Oropharynx is clear and moist and mucous membranes are normal.  Eyes: Conjunctivae and EOM are normal. Pupils are equal, round, and reactive to light.  Neck: Normal range of motion. Neck supple.  Cardiovascular: Regular rhythm, S1 normal and S2 normal.  Exam reveals no gallop and no friction rub.   No murmur heard. Pulmonary/Chest: Effort normal and breath sounds normal. No respiratory distress. He exhibits no tenderness.  Abdominal: Soft. Normal appearance and bowel sounds are normal. There is no hepatosplenomegaly.  There is tenderness in the right upper quadrant and epigastric area. There is no rebound, no guarding, no tenderness at McBurney's point and negative Murphy's sign. No hernia.  Musculoskeletal: Normal range of motion.  Neurological: He is alert and oriented to person, place, and time. He has normal strength. No cranial nerve deficit or sensory deficit. Coordination normal. GCS eye subscore is 4. GCS verbal subscore is 5. GCS motor subscore is 6.  Skin: Skin is  warm, dry and intact. No rash noted. No cyanosis.  Psychiatric: He has a normal mood and affect. His speech is normal and behavior is normal. Thought content normal.  Nursing note and vitals reviewed.    ED Treatments / Results  Labs (all labs ordered are listed, but only abnormal results are displayed) Labs Reviewed  COMPREHENSIVE METABOLIC PANEL - Abnormal; Notable for the following:       Result Value   Potassium 3.3 (*)    Glucose, Bld 118 (*)    All other components within normal limits  URINALYSIS, ROUTINE W REFLEX MICROSCOPIC - Abnormal; Notable for the following:    Color, Urine COLORLESS (*)    Specific Gravity, Urine 1.002 (*)    All other components within normal limits  LIPASE, BLOOD  CBC    EKG  EKG Interpretation None       Radiology No results found.  Procedures Procedures (including critical care time)  Medications Ordered in ED Medications  gi cocktail (Maalox,Lidocaine,Donnatal) (30 mLs Oral Given 03/30/17 0518)  famotidine (PEPCID) tablet 40 mg (40 mg Oral Given 03/30/17 0518)     Initial Impression / Assessment and Plan / ED Course  I have reviewed the triage vital signs and the nursing notes.  Pertinent labs & imaging results that were available during my care of the patient were reviewed by me and considered in my medical decision making (see chart for details).     Patient presents to the ER for repeat evaluation of right upper quadrant abdominal pain. Patient had a thorough exam previous visit including cardiac evaluation and ultrasound. He was initiated on Prilosec but continues to have symptoms. Patient will undergo CT abdomen and pelvis for further evaluation. Will sign out to oncoming ER physician to follow-up CT. Expect it will be within normal limits and patient can be discharged with follow-up with GI.  Final Clinical Impressions(s) / ED Diagnoses   Final diagnoses:  Right upper quadrant abdominal pain    New Prescriptions New  Prescriptions   No medications on file     Orpah Greek, MD 03/30/17 [phone]

## 2017-03-30 NOTE — ED Notes (Signed)
Pt taken to CT.

## 2017-03-30 NOTE — Consult Note (Signed)
Jorge [name]  Jorge [name] 1951-06-13  [id number].    Requesting MD: Regenia Skeeter Chief Complaint/Reason for Consult: Abdominal pain  HPI:  Jorge [name] is a 66yo male who presented to the [facility] earlier today with persistent abdominal pain. Patient states that this has been going on x1 month. Prior to the last month he had never had pain like this. Some days are worse than others. He points to his RUQ as to where the pain is. Describes the pain as burning. Unsure what makes it worse. Drinking cold water helps a little. Denies nausea, vomiting, fever, chills, constipation, diarrhea. Last meal was last night. Patient was in the ED 03/21/17 for the same symptoms; RUQ u/s showed no cholelithiasis or evidence of acute cholecystitis.  This visit his WBC, LFTs, lipase are WNL. CT scan of the abdomen/pelvis did not show any definite acute issues.  PMH significant for HTN Abdominal surgical history: none Patient has never had colonoscopy or upper endoscopy Anticoagulants: none Nonsmoker  ROS: Review of Systems  Constitutional: Negative.   HENT: Negative.   Eyes: Negative.   Respiratory: Negative.   Cardiovascular: Negative.   Gastrointestinal: Positive for abdominal pain. Negative for blood in stool, constipation, diarrhea, heartburn, melena, nausea and vomiting.  Genitourinary: Negative.   Musculoskeletal: Negative.   Skin: Negative.   Neurological: Negative.   All systems reviewed and otherwise negative except for as above  History reviewed. No pertinent family history.  Past Medical History:  Diagnosis Date  . Diabetes mellitus without complication ([place])   . Hypertension     Past Surgical History:  Procedure Laterality Date  . LUMBAR LAMINECTOMY/DECOMPRESSION MICRODISCECTOMY Right 12/25/2016   Procedure: Lumbar three-five Laminectomy, Right Lumbar three-four, Lumbar four-five Diskectomy ;  Surgeon: Kevan Ny Ditty, MD;  Location: [facility];  Service: Neurosurgery;   Laterality: Right;    Social History:  reports that he quit smoking about 5 years ago. He has a 10.00 pack-year smoking history. He has never used smokeless tobacco. He reports that he does not drink alcohol or use drugs.  Allergies:  Allergies  Allergen Reactions  . No Known Allergies      (Not in a hospital admission)  Prior to Admission medications   Medication Sig Start Date End Date Taking? Authorizing Provider  amLODipine (NORVASC) 5 MG tablet Take 5 mg by mouth daily. 07/16/16  Yes Historical Provider, MD  atorvastatin (LIPITOR) 40 MG tablet Take 40 mg by mouth daily.   Yes Historical Provider, MD  diazepam (VALIUM) 10 MG tablet Take 1 tablet (10 mg total) by mouth every 8 (eight) hours as needed (muscle spasm). 12/26/16  Yes Earnie Larsson, MD  omeprazole (PRILOSEC) 20 MG capsule Take 1 capsule (20 mg total) by mouth daily. 03/21/17  Yes Wenda Overland Little, MD  polyethylene glycol [facility]) packet Take 17 g by mouth daily. Patient taking differently: Take 17 g by mouth daily as needed for mild constipation.  12/10/16  Yes Merryl Hacker, MD  traMADol (ULTRAM) 50 MG tablet Take 50 mg by mouth every 6 (six) hours as needed (pain).  12/17/16  Yes Historical Provider, MD    Blood pressure (!) 152/88, pulse 72, temperature 98 F (36.7 C), temperature source Oral, resp. rate 17, SpO2 98 %. Physical Exam: General: pleasant, WD/WN male who is laying in bed in NAD HEENT: head is normocephalic, atraumatic.  Sclera are noninjected.  PERRL.  Ears and nose without any masses or lesions.  Mouth is pink and moist. Good  dentition. Heart: regular, rate, and rhythm.  No obvious murmurs, gallops, or rubs noted.  Palpable pedal pulses bilaterally Lungs: CTAB, no wheezes, rhonchi, or rales noted.  Respiratory effort nonlabored Abd: soft, ND, +BS, no masses, hernias, or organomegaly. Mild tenderness RUQ and RLQ MS: all 4 extremities are symmetrical with no cyanosis, clubbing, or edema. Skin: warm  and dry with no masses, lesions, or rashes Psych: A&Ox3 with an appropriate affect. Neuro: CM 2-12 intact, extremity CSM intact bilaterally, normal speech  Results for orders placed or performed during the hospital encounter of 03/30/17 (from the past 48 hour(s))  Urinalysis, Routine w reflex microscopic     Status: Abnormal   Collection Time: 03/30/17  1:38 AM  Result Value Ref Range   Color, Urine COLORLESS (A) YELLOW   APPearance CLEAR CLEAR   Specific Gravity, Urine 1.002 (L) 1.005 - 1.030   pH 7.0 5.0 - 8.0   Glucose, UA NEGATIVE NEGATIVE mg/dL   Hgb urine dipstick NEGATIVE NEGATIVE   Bilirubin Urine NEGATIVE NEGATIVE   Ketones, ur NEGATIVE NEGATIVE mg/dL   Protein, ur NEGATIVE NEGATIVE mg/dL   Nitrite NEGATIVE NEGATIVE   Leukocytes, UA NEGATIVE NEGATIVE  Lipase, blood     Status: None   Collection Time: 03/30/17  1:39 AM  Result Value Ref Range   Lipase 13 11 - 51 U/L  Comprehensive metabolic panel     Status: Abnormal   Collection Time: 03/30/17  1:39 AM  Result Value Ref Range   Sodium 141 135 - 145 mmol/L   Potassium 3.3 (L) 3.5 - 5.1 mmol/L   Chloride 106 101 - 111 mmol/L   CO2 24 22 - 32 mmol/L   Glucose, Bld 118 (H) 65 - 99 mg/dL   BUN 6 6 - 20 mg/dL   Creatinine, Ser 0.85 0.61 - 1.24 mg/dL   Calcium 9.5 8.9 - 10.3 mg/dL   Total Protein 7.0 6.5 - 8.1 g/dL   Albumin 4.3 3.5 - 5.0 g/dL   AST 27 15 - 41 U/L   ALT 24 17 - 63 U/L   Alkaline Phosphatase 108 38 - 126 U/L   Total Bilirubin 1.2 0.3 - 1.2 mg/dL   GFR calc non Af Amer >60 >60 mL/min   GFR calc Af Amer >60 >60 mL/min    Comment: ([name]) The eGFR has been calculated using the CKD EPI equation. This calculation has not been validated in all clinical situations. eGFR's persistently <60 mL/min signify possible Chronic Kidney Disease.    Anion gap 11 5 - 15  CBC     Status: None   Collection Time: 03/30/17  1:39 AM  Result Value Ref Range   WBC 6.1 4.0 - 10.5 K/uL   RBC 5.19 4.22 - 5.81 MIL/uL    Hemoglobin 15.4 13.0 - 17.0 g/dL   HCT 43.4 39.0 - 52.0 %   MCV 83.6 78.0 - 100.0 fL   MCH 29.7 26.0 - 34.0 pg   MCHC 35.5 30.0 - 36.0 g/dL   RDW 12.0 11.5 - 15.5 %   Platelets 157 150 - 400 K/uL   Ct Abdomen Pelvis W Contrast  Result Date: 03/30/2017 CLINICAL DATA:  66 year old hypertensive diabetic male with burning sensation right upper quadrant. Subsequent encounter. EXAM: CT ABDOMEN AND PELVIS WITH CONTRAST TECHNIQUE: Multidetector CT imaging of the abdomen and pelvis was performed using the standard protocol following bolus administration of intravenous contrast. CONTRAST:  118m ISOVUE-300 IOPAMIDOL (ISOVUE-300) INJECTION 61% COMPARISON:  03/21/2017 right upper quadrant ultrasound. 12/10/2016 CT  abdomen and pelvis. FINDINGS: Lower chest: Minimal atelectasis lung bases.  Heart size top-normal. Hepatobiliary: Fatty liver. Left lobe 5.5 mm low-density structure unchanged, possibly a small cyst. Adjacent to the fissure for the falciform ligament, 1.2 cm area of altered signal intensity without significant change. Question focal area of fatty sparing versus small hemangioma. No calcified gallstones. Pancreas: No pancreatic mass, inflammation or duct dilation. Spleen: No splenic mass or enlargement. Adrenals/Urinary Tract: No renal or ureteral obstructing stone or evidence hydronephrosis. No renal or adrenal mass. Noncontrast filled views of the urinary bladder unremarkable. Stomach/Bowel: Under distended stomach with limited evaluation. Portions of colon under distended. Top-normal size appendix. No extraluminal bowel inflammatory process, free fluid or free air. Vascular/Lymphatic: Atherosclerotic changes aorta, branch vessels including iliac arteries. No aneurysm or large vessel occlusion. No adenopathy. Reproductive: Prostate gland slightly heterogeneous. Clinical and laboratory correlation recommended. Other: No bowel containing hernia. Musculoskeletal: Postsurgical changes L4-5 and L5-S1. No acute  osseous abnormality. IMPRESSION: Fatty liver. Left lobe 5.5 mm low-density structure unchanged, possibly a small cyst. Adjacent to the fissure for the falciform ligament, 1.2 cm area of altered signal intensity without significant change. Question focal area of fatty sparing versus small hemangioma. No calcified gallstones. Under distended stomach with limited evaluation. Portions of colon under distended. Appendix top-normal in size. No extraluminal bowel inflammatory process, free fluid or free air. Prostate gland slightly heterogeneous. Clinical and laboratory correlation recommended. Postsurgical changes L4-5 and L5-S1. Aortic atherosclerosis. Electronically Signed   By: Genia Del M.D.   On: 03/30/2017 09:24      Assessment/Plan Chronic abdominal pain - 1 month of persistent right sided abdominal pain - CBC, LFTs, lipase WNL - CT with no definite acute findings - VSS - RUQ u/s 3/25 showed no cholelithiasis or acute cholecystitis  HTN  Plan - Patient does not have any acute inflammatory process that would require emergent surgical intervention. His pain is of unclear etiology, but does not necessitate hospital admission. Recommend referral to outpatient gastroenterology for workup of chronic of abdominal pain. Discussed this with Dr. Regenia Skeeter who will assist with referral.  Jerrye Beavers, [facility] 03/30/2017, 11:53 AM Pager: [phone] Consults: [phone] [place]-Fri 7:00 am-4:30 pm Sat-Sun 7:00 am-11:30 am

## 2017-03-30 NOTE — ED Provider Notes (Signed)
10:34 AM Patient's CT with no obvious acute process, but he does have a larger appendix but technically normal. Given this, I will ask surgery to consult as the only area he is tender is RLQ.  12:08 PM Pain less prominent now. Surgery has seen, elicit pain has now been for 1 month rather than acutely. Thus doubt appendicitis. Refer to GI. Discussed return precautions.   Sherwood Gambler, MD 03/30/17 1209

## 2017-03-30 NOTE — ED Triage Notes (Signed)
Pt presents with continued  RUQ abd pain described as burning; pt denies n/v/d, fever, bloating

## 2017-03-30 NOTE — ED Notes (Signed)
MD Goldston at the bedside  

## 2017-03-30 NOTE — ED Notes (Signed)
General Surgery PA at the bedside

## 2017-03-31 ENCOUNTER — Encounter: Payer: Self-pay | Admitting: Gastroenterology

## 2017-04-07 ENCOUNTER — Emergency Department (HOSPITAL_COMMUNITY): Payer: Medicaid Other

## 2017-04-07 ENCOUNTER — Encounter (HOSPITAL_COMMUNITY): Payer: Self-pay | Admitting: Emergency Medicine

## 2017-04-07 DIAGNOSIS — R1013 Epigastric pain: Secondary | ICD-10-CM | POA: Diagnosis present

## 2017-04-07 DIAGNOSIS — Z5321 Procedure and treatment not carried out due to patient leaving prior to being seen by health care provider: Secondary | ICD-10-CM | POA: Diagnosis not present

## 2017-04-07 LAB — BASIC METABOLIC PANEL
Anion gap: 11 (ref 5–15)
BUN: 7 mg/dL (ref 6–20)
CHLORIDE: 108 mmol/L (ref 101–111)
CO2: 23 mmol/L (ref 22–32)
CREATININE: 0.83 mg/dL (ref 0.61–1.24)
Calcium: 9.3 mg/dL (ref 8.9–10.3)
Glucose, Bld: 137 mg/dL — ABNORMAL HIGH (ref 65–99)
Potassium: 3.3 mmol/L — ABNORMAL LOW (ref 3.5–5.1)
SODIUM: 142 mmol/L (ref 135–145)

## 2017-04-07 LAB — CBC
HCT: 41.7 % (ref 39.0–52.0)
Hemoglobin: 14.6 g/dL (ref 13.0–17.0)
MCH: 29.3 pg (ref 26.0–34.0)
MCHC: 35 g/dL (ref 30.0–36.0)
MCV: 83.6 fL (ref 78.0–100.0)
PLATELETS: 146 10*3/uL — AB (ref 150–400)
RBC: 4.99 MIL/uL (ref 4.22–5.81)
RDW: 12.1 % (ref 11.5–15.5)
WBC: 5.5 10*3/uL (ref 4.0–10.5)

## 2017-04-07 LAB — CBG MONITORING, ED: Glucose-Capillary: 123 mg/dL — ABNORMAL HIGH (ref 65–99)

## 2017-04-07 LAB — I-STAT TROPONIN, ED: Troponin i, poc: 0 ng/mL (ref 0.00–0.08)

## 2017-04-07 NOTE — ED Notes (Signed)
Checked [facility] 123, RN Santiago Glad informed

## 2017-04-07 NOTE — ED Triage Notes (Signed)
C/o intermittent epigastric burning x 1 week.  Taking Omeprazole without relief.  Denies sob, nausea, and vomiting.

## 2017-04-08 ENCOUNTER — Emergency Department (HOSPITAL_COMMUNITY)
Admission: EM | Admit: 2017-04-08 | Discharge: 2017-04-08 | Disposition: A | Discharge status: Home / Self Care | Payer: Medicaid Other | Attending: Emergency Medicine | Admitting: Emergency Medicine

## 2017-04-08 HISTORY — DX: Gastro-esophageal reflux disease without esophagitis: K21.9

## 2017-04-08 NOTE — ED Notes (Signed)
No answer in waiting area.

## 2017-04-08 NOTE — ED Notes (Addendum)
No answer in waiting area for treatment room.  Attempted to call pt on cellphone but no answer

## 2017-04-18 ENCOUNTER — Encounter (HOSPITAL_COMMUNITY): Payer: Self-pay

## 2017-04-18 ENCOUNTER — Emergency Department (HOSPITAL_COMMUNITY)
Admission: EM | Admit: 2017-04-18 | Discharge: 2017-04-18 | Disposition: A | Discharge status: Home / Self Care | Payer: Medicaid Other | Attending: Emergency Medicine | Admitting: Emergency Medicine

## 2017-04-18 DIAGNOSIS — I10 Essential (primary) hypertension: Secondary | ICD-10-CM | POA: Diagnosis not present

## 2017-04-18 DIAGNOSIS — K219 Gastro-esophageal reflux disease without esophagitis: Secondary | ICD-10-CM

## 2017-04-18 DIAGNOSIS — Z87891 Personal history of nicotine dependence: Secondary | ICD-10-CM | POA: Insufficient documentation

## 2017-04-18 DIAGNOSIS — Z79899 Other long term (current) drug therapy: Secondary | ICD-10-CM | POA: Diagnosis not present

## 2017-04-18 DIAGNOSIS — E119 Type 2 diabetes mellitus without complications: Secondary | ICD-10-CM | POA: Insufficient documentation

## 2017-04-18 LAB — URINALYSIS, ROUTINE W REFLEX MICROSCOPIC
Bilirubin Urine: NEGATIVE
Glucose, UA: NEGATIVE mg/dL
Hgb urine dipstick: NEGATIVE
Ketones, ur: NEGATIVE mg/dL
Leukocytes, UA: NEGATIVE
Nitrite: NEGATIVE
Protein, ur: NEGATIVE mg/dL
Specific Gravity, Urine: 1.006 (ref 1.005–1.030)
pH: 7 (ref 5.0–8.0)

## 2017-04-18 LAB — COMPREHENSIVE METABOLIC PANEL
ALBUMIN: 4.2 g/dL (ref 3.5–5.0)
ALK PHOS: 112 U/L (ref 38–126)
ALT: 26 U/L (ref 17–63)
ANION GAP: 10 (ref 5–15)
AST: 27 U/L (ref 15–41)
BUN: 5 mg/dL — ABNORMAL LOW (ref 6–20)
CHLORIDE: 107 mmol/L (ref 101–111)
CO2: 25 mmol/L (ref 22–32)
Calcium: 9.3 mg/dL (ref 8.9–10.3)
Creatinine, Ser: 0.71 mg/dL (ref 0.61–1.24)
GFR calc Af Amer: >60 mL/min (ref >60)
GFR calc non Af Amer: >60 mL/min (ref >60)
GLUCOSE: 107 mg/dL — AB (ref 65–99)
Potassium: 3.1 mmol/L — ABNORMAL LOW (ref 3.5–5.1)
SODIUM: 142 mmol/L (ref 135–145)
Total Bilirubin: 1.5 mg/dL — ABNORMAL HIGH (ref 0.3–1.2)
Total Protein: 7.3 g/dL (ref 6.5–8.1)

## 2017-04-18 LAB — CBC WITH DIFFERENTIAL/PLATELET
BASOS PCT: 0 %
Basophils Absolute: 0 10*3/uL (ref 0.0–0.1)
EOS ABS: 0.1 10*3/uL (ref 0.0–0.7)
EOS PCT: 1 %
HCT: 42.8 % (ref 39.0–52.0)
HEMOGLOBIN: 15.2 g/dL (ref 13.0–17.0)
LYMPHS ABS: 2 10*3/uL (ref 0.7–4.0)
Lymphocytes Relative: 42 %
MCH: 29.1 pg (ref 26.0–34.0)
MCHC: 35.5 g/dL (ref 30.0–36.0)
MCV: 82 fL (ref 78.0–100.0)
Monocytes Absolute: 0.4 10*3/uL (ref 0.1–1.0)
Monocytes Relative: 8 %
Neutro Abs: 2.2 10*3/uL (ref 1.7–7.7)
Neutrophils Relative %: 49 %
PLATELETS: 153 10*3/uL (ref 150–400)
RBC: 5.22 MIL/uL (ref 4.22–5.81)
RDW: 12.3 % (ref 11.5–15.5)
WBC: 4.6 10*3/uL (ref 4.0–10.5)

## 2017-04-18 LAB — LIPASE, BLOOD: Lipase: 18 U/L (ref 11–51)

## 2017-04-18 MED ORDER — GI COCKTAIL ~~LOC~~
30.0000 mL | Freq: Once | ORAL | Status: AC
Start: 2017-04-18 — End: 2017-04-18
  Administered 2017-04-18: 30 mL via ORAL
  Filled 2017-04-18: qty 30

## 2017-04-18 MED ORDER — POTASSIUM CHLORIDE CRYS ER 20 MEQ PO TBCR
40.0000 meq | EXTENDED_RELEASE_TABLET | Freq: Once | ORAL | Status: AC
  Administered 2017-04-18: 40 meq via ORAL
  Filled 2017-04-18: qty 2

## 2017-04-18 MED ORDER — FAMOTIDINE 40 MG PO TABS: 40.0000 mg | ORAL_TABLET | Freq: Every day | ORAL | 0 refills | Status: DC

## 2017-04-18 NOTE — ED Notes (Signed)
Pt states he understands instructions. Home stable with steady gait with son.

## 2017-04-18 NOTE — ED Provider Notes (Signed)
[facility] DEPT Provider Note   CSN: [id number] Arrival date & time: 04/18/17  0251     History   Chief Complaint Chief Complaint  Patient presents with  . Abdominal Pain  . Gastroesophageal Reflux    HPI Jorge [name] is a 66 y.o. male.  HPI   Patient is a 66 year old male with history of hypertension, diabetes and GERD who presents the ED with complaint of abdominal pain. Patient reports he has had intermittent abdominal pain for the past month. He states he has been seen in the ED multiple times for similar symptoms and was given medication for acid reflux. Patient states he has been taking his medication as prescribed with intermittent relief but has continued to have pain. He reports having an intermittent burning sensation in his mid upper abdomen that radiates up his chest. Denies any aggravating or alleviating factors. He reports the pain is consistent with the pain he has been having during his previous ED evaluations. Patient notes he was seen by her primary care provider last week for follow-up evaluation and was prescribed omeprazole and Carafate which he has been taking as prescribed. He reports having continued symptoms resulting in him coming to the ED this morning. He also reports having mild dysuria over the past week. Denies fever, chills, cough, shortness of breath, chest pain, nausea, vomiting, diarrhea, constipation, blood in urine or stool, flank pain, back pain. Denies history of abdominal surgeries.  Past Medical History:  Diagnosis Date  . Acid reflux   . Diabetes mellitus without complication ([place])   . Hypertension     Patient Active Problem List   Diagnosis Date Noted  . Lumbosacral spondylosis with radiculopathy 12/25/2016    Past Surgical History:  Procedure Laterality Date  . LUMBAR LAMINECTOMY/DECOMPRESSION MICRODISCECTOMY Right 12/25/2016   Procedure: Lumbar three-five Laminectomy, Right Lumbar three-four, Lumbar four-five Diskectomy ;  Surgeon:  Kevan Ny Ditty, MD;  Location: [facility];  Service: Neurosurgery;  Laterality: Right;       Home Medications    Prior to Admission medications   Medication Sig Start Date End Date Taking? Authorizing Provider  amLODipine (NORVASC) 5 MG tablet Take 5 mg by mouth daily. 07/16/16  Yes Historical Provider, MD  atorvastatin (LIPITOR) 40 MG tablet Take 40 mg by mouth daily.   Yes Historical Provider, MD  CARAFATE 1 GM/10ML suspension Take 1 g by mouth 4 (four) times daily -  with meals and at bedtime.  04/09/17  Yes Historical Provider, MD  diazepam (VALIUM) 10 MG tablet Take 1 tablet (10 mg total) by mouth every 8 (eight) hours as needed (muscle spasm). 12/26/16  Yes Earnie Larsson, MD  omeprazole (PRILOSEC) 20 MG capsule Take 1 capsule (20 mg total) by mouth daily. 03/21/17  Yes Wenda Overland Little, MD  polyethylene glycol [facility]) packet Take 17 g by mouth daily. Patient taking differently: Take 17 g by mouth daily as needed for mild constipation.  12/10/16  Yes Merryl Hacker, MD  traMADol (ULTRAM) 50 MG tablet Take 50 mg by mouth every 6 (six) hours as needed (pain).  12/17/16  Yes Historical Provider, MD  famotidine (PEPCID) 40 MG tablet Take 1 tablet (40 mg total) by mouth at bedtime. 04/18/17   Nona Dell, PA-C    Family History History reviewed. No pertinent family history.  Social History Social History  Substance Use Topics  . Smoking status: Former Smoker    Packs/day: 0.25    Years: 40.00    Quit date: 12/17/2011  .  Smokeless tobacco: Never Used  . Alcohol use No     Allergies   No known allergies   Review of Systems Review of Systems  Gastrointestinal: Positive for abdominal pain.  Genitourinary: Positive for dysuria.  All other systems reviewed and are negative.    Physical Exam Updated Vital Signs BP (!) 155/89 (BP Location: Right Arm)   Pulse (!) 55   Temp 97.4 F (36.3 C) (Oral)   Resp 16   Ht 5\' 1"  (1.549 m)   Wt 74.8 kg   SpO2 97%    BMI 31.18 kg/m   Physical Exam  Constitutional: He is oriented to person, place, and time. He appears well-developed and well-nourished. No distress.  HENT:  Head: Normocephalic and atraumatic.  Mouth/Throat: Oropharynx is clear and moist. No oropharyngeal exudate.  Eyes: Conjunctivae and EOM are normal. Right eye exhibits no discharge. Left eye exhibits no discharge. No scleral icterus.  Neck: Normal range of motion. Neck supple.  Cardiovascular: Normal rate, regular rhythm, normal heart sounds and intact distal pulses.   Pulmonary/Chest: Effort normal and breath sounds normal. No respiratory distress. He has no wheezes. He has no rales. He exhibits no tenderness.  Abdominal: Soft. Normal appearance and bowel sounds are normal. He exhibits no distension and no mass. There is tenderness. There is no rigidity, no rebound, no guarding and no CVA tenderness. No hernia.  Mild diffuse abdominal tenderness, moderate TTP over epigastric region.  Musculoskeletal: Normal range of motion. He exhibits no edema.  Neurological: He is alert and oriented to person, place, and time.  Skin: Skin is warm and dry. He is not diaphoretic.  Nursing note and vitals reviewed.    ED Treatments / Results  Labs (all labs ordered are listed, but only abnormal results are displayed) Labs Reviewed  COMPREHENSIVE METABOLIC PANEL - Abnormal; Notable for the following:       Result Value   Potassium 3.1 (*)    Glucose, Bld 107 (*)    BUN 5 (*)    Total Bilirubin 1.5 (*)    All other components within normal limits  URINALYSIS, ROUTINE W REFLEX MICROSCOPIC - Abnormal; Notable for the following:    Color, Urine STRAW (*)    All other components within normal limits  CBC WITH DIFFERENTIAL/PLATELET  LIPASE, BLOOD    EKG  EKG Interpretation None       Radiology No results found.  Procedures Procedures (including critical care time)  Medications Ordered in ED Medications  gi cocktail  (Maalox,Lidocaine,Donnatal) (30 mLs Oral Given 04/18/17 0656)  potassium chloride SA (K-DUR,KLOR-CON) CR tablet 40 mEq (40 mEq Oral Given 04/18/17 0745)     Initial Impression / Assessment and Plan / ED Course  I have reviewed the triage vital signs and the nursing notes.  Pertinent labs & imaging results that were available during my care of the patient were reviewed by me and considered in my medical decision making (see chart for details).     Patient presents with burning sensation to his epigastric region that radiates up his chest consistent with abdominal pain he has had over the past few weeks. Mild intermittent relief with omeprazole he has been taking at home. Also endorses dysuria. Denies fever. VSS. Exam revealed mild diffuse abdominal tenderness with moderate tenderness over epigastric region, no peritoneal signs. Remaining exam unremarkable. Patient given GI cocktail in the ED.  Chart review shows patient was last seen in the ED on 03/30/17 for similar symptoms. He had a CT abdomen  performed at that time which showed a larger appendix but technically normal. Patient was evaluated by surgery, due to patient's symptoms being present for 1 month presentation did not appear consistent with appendicitis and patient was discharged home with GI follow-up. He was also seen in the ED on 03/20/17 for similar symptoms, lab work was unremarkable, right upper quadrant ultrasound was negative, patient was given GI cocktail and discharged home on PPI. Patient reports he has an appointment scheduled with gastroenterology on May 11.  Labs showed potassium 3.1, otherwise unremarkable. Patient given oral potassium supplement. UA negative. Pt's sxs consistent with abdominal pain he has been having over the past month. Suspect sxs related to GERD. Due to pt with similar sxs during recent ED visits with negative CT abdomen, labs and RUQ [place], I do not feel that any further workup/imaging is warranted at this time.  Reevaluation patient reports improvement of symptoms after GI cocktail. Plan to discharge patient with prescription to single dose of Pepcid at night in addition to his home prescriptions of omeprazole. Advised patient to continue taking his perception of Carafate as prescribed for additional relief. Discussed diet and lifestyle changes with patient and son at bedside. Advised patient to follow up with gastroenterology at his scheduled appointment for further management and evaluation of his acid reflux with for outpatient endoscopy. Discussed return precautions.   Final Clinical Impressions(s) / ED Diagnoses   Final diagnoses:  Gastroesophageal reflux disease, esophagitis presence not specified    New Prescriptions New Prescriptions   FAMOTIDINE (PEPCID) 40 MG TABLET    Take 1 tablet (40 mg total) by mouth at bedtime.     Chesley Noon [place], [place] 04/18/17 [place], DO 04/18/17 [phone]

## 2017-04-18 NOTE — Discharge Instructions (Signed)
Continue taking your home prescription of omeprazole and Carafate as prescribed. I recommend taking her new prescription of famotidine as prescribed. Follow the diet and lifestyle changes listed below with your symptoms related to acid reflux. Follow-up with your gastroenterologist at your scheduled appointment. Return to the emergency department if symptoms worsen or new onset of fever, chest pain, difficulty breathing, vomiting, unable to keep fluids down, new/worsening abdominal pain, diarrhea, constipation.

## 2017-04-18 NOTE — ED Notes (Signed)
Family at bedside. 

## 2017-04-18 NOTE — ED Triage Notes (Signed)
Pt endorses abd discomfort and reflux and seen here multiples times for the same. VSS. Positive bowel sounds. Pt is taking medication for reflux without relief.

## 2017-04-23 ENCOUNTER — Encounter (HOSPITAL_COMMUNITY): Payer: Self-pay

## 2017-04-23 DIAGNOSIS — Z79899 Other long term (current) drug therapy: Secondary | ICD-10-CM | POA: Insufficient documentation

## 2017-04-23 DIAGNOSIS — I10 Essential (primary) hypertension: Secondary | ICD-10-CM | POA: Diagnosis not present

## 2017-04-23 DIAGNOSIS — Z87891 Personal history of nicotine dependence: Secondary | ICD-10-CM | POA: Diagnosis not present

## 2017-04-23 DIAGNOSIS — Z5321 Procedure and treatment not carried out due to patient leaving prior to being seen by health care provider: Secondary | ICD-10-CM | POA: Diagnosis not present

## 2017-04-23 DIAGNOSIS — R109 Unspecified abdominal pain: Secondary | ICD-10-CM | POA: Insufficient documentation

## 2017-04-23 DIAGNOSIS — E119 Type 2 diabetes mellitus without complications: Secondary | ICD-10-CM | POA: Diagnosis not present

## 2017-04-23 LAB — COMPREHENSIVE METABOLIC PANEL
ALBUMIN: 4.4 g/dL (ref 3.5–5.0)
ALT: 30 U/L (ref 17–63)
ANION GAP: 9 (ref 5–15)
AST: 36 U/L (ref 15–41)
Alkaline Phosphatase: 106 U/L (ref 38–126)
BILIRUBIN TOTAL: 1.2 mg/dL (ref 0.3–1.2)
BUN: 7 mg/dL (ref 6–20)
CO2: 24 mmol/L (ref 22–32)
Calcium: 9.2 mg/dL (ref 8.9–10.3)
Chloride: 108 mmol/L (ref 101–111)
Creatinine, Ser: 0.84 mg/dL (ref 0.61–1.24)
GFR calc Af Amer: >60 mL/min (ref >60)
GFR calc non Af Amer: >60 mL/min (ref >60)
Glucose, Bld: 112 mg/dL — ABNORMAL HIGH (ref 65–99)
POTASSIUM: 3.3 mmol/L — AB (ref 3.5–5.1)
SODIUM: 141 mmol/L (ref 135–145)
TOTAL PROTEIN: 6.8 g/dL (ref 6.5–8.1)

## 2017-04-23 LAB — URINALYSIS, ROUTINE W REFLEX MICROSCOPIC
BILIRUBIN URINE: NEGATIVE
GLUCOSE, UA: NEGATIVE mg/dL
HGB URINE DIPSTICK: NEGATIVE
KETONES UR: NEGATIVE mg/dL
Leukocytes, UA: NEGATIVE
NITRITE: NEGATIVE
Protein, ur: NEGATIVE mg/dL
SPECIFIC GRAVITY, URINE: 1.003 — AB (ref 1.005–1.030)
pH: 7 (ref 5.0–8.0)

## 2017-04-23 LAB — CBC
HEMATOCRIT: 40.8 % (ref 39.0–52.0)
HEMOGLOBIN: 14.5 g/dL (ref 13.0–17.0)
MCH: 29.2 pg (ref 26.0–34.0)
MCHC: 35.5 g/dL (ref 30.0–36.0)
MCV: 82.1 fL (ref 78.0–100.0)
Platelets: 156 10*3/uL (ref 150–400)
RBC: 4.97 MIL/uL (ref 4.22–5.81)
RDW: 12.3 % (ref 11.5–15.5)
WBC: 5.3 10*3/uL (ref 4.0–10.5)

## 2017-04-23 LAB — LIPASE, BLOOD: Lipase: 18 U/L (ref 11–51)

## 2017-04-23 NOTE — ED Triage Notes (Signed)
Pt complaining of abdominal pain x 1 month. Pt states seen here for same, no relief. Pt denies any N/V/D. Pt denies any urinary symptoms.

## 2017-04-24 ENCOUNTER — Emergency Department (HOSPITAL_COMMUNITY)
Admission: EM | Admit: 2017-04-24 | Discharge: 2017-04-24 | Disposition: A | Discharge status: Home / Self Care | Payer: Medicaid Other | Attending: Emergency Medicine | Admitting: Emergency Medicine

## 2017-04-24 NOTE — ED Notes (Signed)
Called pt for a room no respond. Pt LWBS.

## 2017-04-24 NOTE — ED Notes (Signed)
Called pt to a room no respond.

## 2017-04-24 NOTE — ED Notes (Signed)
Called pt to room no respond

## 2017-05-01 ENCOUNTER — Emergency Department (HOSPITAL_COMMUNITY)
Admission: EM | Admit: 2017-05-01 | Discharge: 2017-05-01 | Disposition: A | Discharge status: Home / Self Care | Payer: Medicaid Other | Attending: Emergency Medicine | Admitting: Emergency Medicine

## 2017-05-01 ENCOUNTER — Encounter (HOSPITAL_COMMUNITY): Payer: Self-pay | Admitting: Emergency Medicine

## 2017-05-01 DIAGNOSIS — Z87891 Personal history of nicotine dependence: Secondary | ICD-10-CM | POA: Diagnosis not present

## 2017-05-01 DIAGNOSIS — R1013 Epigastric pain: Secondary | ICD-10-CM | POA: Insufficient documentation

## 2017-05-01 DIAGNOSIS — E119 Type 2 diabetes mellitus without complications: Secondary | ICD-10-CM | POA: Diagnosis not present

## 2017-05-01 DIAGNOSIS — I10 Essential (primary) hypertension: Secondary | ICD-10-CM | POA: Insufficient documentation

## 2017-05-01 DIAGNOSIS — R1011 Right upper quadrant pain: Secondary | ICD-10-CM

## 2017-05-01 DIAGNOSIS — Z79899 Other long term (current) drug therapy: Secondary | ICD-10-CM | POA: Insufficient documentation

## 2017-05-01 LAB — URINALYSIS, ROUTINE W REFLEX MICROSCOPIC
BILIRUBIN URINE: NEGATIVE
Glucose, UA: NEGATIVE mg/dL
Hgb urine dipstick: NEGATIVE
KETONES UR: NEGATIVE mg/dL
LEUKOCYTES UA: NEGATIVE
NITRITE: NEGATIVE
PH: 6 (ref 5.0–8.0)
PROTEIN: NEGATIVE mg/dL
Specific Gravity, Urine: 1.003 — ABNORMAL LOW (ref 1.005–1.030)

## 2017-05-01 LAB — COMPREHENSIVE METABOLIC PANEL
ALBUMIN: 4.3 g/dL (ref 3.5–5.0)
ALK PHOS: 109 U/L (ref 38–126)
ALT: 27 U/L (ref 17–63)
AST: 31 U/L (ref 15–41)
Anion gap: 10 (ref 5–15)
BILIRUBIN TOTAL: 1.7 mg/dL — AB (ref 0.3–1.2)
BUN: 6 mg/dL (ref 6–20)
CALCIUM: 9.3 mg/dL (ref 8.9–10.3)
CO2: 23 mmol/L (ref 22–32)
Chloride: 108 mmol/L (ref 101–111)
Creatinine, Ser: 0.75 mg/dL (ref 0.61–1.24)
GFR calc Af Amer: >60 mL/min (ref >60)
GLUCOSE: 118 mg/dL — AB (ref 65–99)
POTASSIUM: 2.8 mmol/L — AB (ref 3.5–5.1)
Sodium: 141 mmol/L (ref 135–145)
TOTAL PROTEIN: 7.1 g/dL (ref 6.5–8.1)

## 2017-05-01 LAB — CBC
HEMATOCRIT: 41.1 % (ref 39.0–52.0)
Hemoglobin: 14.3 g/dL (ref 13.0–17.0)
MCH: 28.9 pg (ref 26.0–34.0)
MCHC: 34.8 g/dL (ref 30.0–36.0)
MCV: 83.2 fL (ref 78.0–100.0)
PLATELETS: 151 10*3/uL (ref 150–400)
RBC: 4.94 MIL/uL (ref 4.22–5.81)
RDW: 12.7 % (ref 11.5–15.5)
WBC: 4.9 10*3/uL (ref 4.0–10.5)

## 2017-05-01 LAB — LIPASE, BLOOD: Lipase: 24 U/L (ref 11–51)

## 2017-05-01 MED ORDER — POTASSIUM CHLORIDE CRYS ER 20 MEQ PO TBCR
40.0000 meq | EXTENDED_RELEASE_TABLET | Freq: Once | ORAL | Status: AC
  Administered 2017-05-01: 40 meq via ORAL
  Filled 2017-05-01: qty 2

## 2017-05-01 MED ORDER — POTASSIUM CHLORIDE CRYS ER 20 MEQ PO TBCR
20.0000 meq | EXTENDED_RELEASE_TABLET | Freq: Every day | ORAL | 0 refills | Status: DC
Start: 2017-05-01 — End: 2017-06-09

## 2017-05-01 MED ORDER — MAGNESIUM CHLORIDE 64 MG PO TBEC
2.0000 | DELAYED_RELEASE_TABLET | Freq: Every day | ORAL | Status: DC
  Administered 2017-05-01: 128 mg via ORAL
  Filled 2017-05-01: qty 2

## 2017-05-01 MED ORDER — DICYCLOMINE HCL 20 MG PO TABS: 20.0000 mg | ORAL_TABLET | Freq: Two times a day (BID) | ORAL | 0 refills | Status: DC

## 2017-05-01 MED ORDER — POTASSIUM CHLORIDE CRYS ER 20 MEQ PO TBCR: 60.0000 meq | EXTENDED_RELEASE_TABLET | Freq: Once | ORAL | Status: DC

## 2017-05-01 MED ORDER — GI COCKTAIL ~~LOC~~
30.0000 mL | Freq: Once | ORAL | Status: AC
  Administered 2017-05-01: 30 mL via ORAL
  Filled 2017-05-01: qty 30

## 2017-05-01 NOTE — ED Provider Notes (Signed)
[facility] DEPT Provider Note   CSN: [id number] Arrival date & time: 05/01/17  0142     History   Chief Complaint Chief Complaint  Patient presents with  . Abdominal Pain    HPI Jorge [name] is a 66 y.o. male with history of diabetes, hypertension, GERD who presents with a 3-4 month history of epigastric and right upper quadrant pain. Patient had a discectomy and laminectomy at the end of December 2017 began having the symptoms around January 2018. Patient reports a burning sensation in his right upper quadrant that radiates to his esophagus. Patient occasionally has associated nausea. He has had a few episodes of vomiting in the past few months, however none recently. Patient has seen his primary care provider who has initiated omeprazole and Carafate, which does not seem to be helping. He denies any aggravating factors. The patient is not a very many greasy or fatty foods. Patient has never had any abdominal surgeries. Patient denies any fever, other chest pain, shortness of breath, diarrhea, constipation, urinary symptoms. Patient has an appointment with gastroenterology on May 11.  HPI  Past Medical History:  Diagnosis Date  . Acid reflux   . Diabetes mellitus without complication ([place])   . Hypertension     Patient Active Problem List   Diagnosis Date Noted  . Lumbosacral spondylosis with radiculopathy 12/25/2016    Past Surgical History:  Procedure Laterality Date  . LUMBAR LAMINECTOMY/DECOMPRESSION MICRODISCECTOMY Right 12/25/2016   Procedure: Lumbar three-five Laminectomy, Right Lumbar three-four, Lumbar four-five Diskectomy ;  Surgeon: Kevan Ny Ditty, MD;  Location: [facility];  Service: Neurosurgery;  Laterality: Right;       Home Medications    Prior to Admission medications   Medication Sig Start Date End Date Taking? Authorizing Provider  amLODipine (NORVASC) 5 MG tablet Take 5 mg by mouth daily. 07/16/16  Yes [provider]  atorvastatin (LIPITOR)  40 MG tablet Take 40 mg by mouth daily.   Yes [provider]  CARAFATE 1 GM/10ML suspension Take 1 g by mouth 3 (three) times daily.  04/09/17  Yes [provider]  dexlansoprazole (DEXILANT) 60 MG capsule Take 60 mg by mouth daily.   Yes [provider]  famotidine (PEPCID) 40 MG tablet Take 1 tablet (40 mg total) by mouth at bedtime. 04/18/17  Yes Nona Dell, PA-C  traMADol (ULTRAM) 50 MG tablet Take 50 mg by mouth 2 (two) times daily.  12/17/16  Yes [provider]  diazepam (VALIUM) 10 MG tablet Take 1 tablet (10 mg total) by mouth every 8 (eight) hours as needed (muscle spasm). 12/26/16   Earnie Larsson, MD  dicyclomine (BENTYL) 20 MG tablet Take 1 tablet (20 mg total) by mouth 2 (two) times daily. 05/01/17   Xavien Dauphinais, Bea Graff, PA-C  omeprazole (PRILOSEC) 20 MG capsule Take 1 capsule (20 mg total) by mouth daily. Patient not taking: Reported on 05/01/2017 03/21/17   Little, Wenda Overland, MD  polyethylene glycol [facility]) packet Take 17 g by mouth daily. Patient taking differently: Take 17 g by mouth daily as needed for mild constipation.  12/10/16   Horton, Barbette Hair, MD  potassium chloride SA (K-DUR,KLOR-CON) 20 MEQ tablet Take 1 tablet (20 mEq total) by mouth daily. 05/01/17   Frederica Kuster, PA-C    Family History No family history on file.  Social History Social History  Substance Use Topics  . Smoking status: Former Smoker    Packs/day: 0.25    Years: 40.00  Quit date: 12/17/2011  . Smokeless tobacco: Never Used  . Alcohol use No     Allergies   No known allergies   Review of Systems Review of Systems  Constitutional: Negative for chills and fever.  HENT: Negative for facial swelling and sore throat.   Respiratory: Negative for shortness of breath.   Cardiovascular: Negative for chest pain.  Gastrointestinal: Positive for abdominal pain and nausea. Negative for constipation, diarrhea and vomiting.  Genitourinary: Negative  for dysuria.  Musculoskeletal: Negative for back pain.  Skin: Negative for rash and wound.  Neurological: Negative for headaches.  Psychiatric/Behavioral: The patient is not nervous/anxious.      Physical Exam Updated Vital Signs BP (!) 153/89 (BP Location: Right Arm)   Pulse (!) 55   Temp 98.6 F (37 C) (Oral)   Resp 16   SpO2 96%   Physical Exam  Constitutional: He appears well-developed and well-nourished. No distress.  HENT:  Head: Normocephalic and atraumatic.  Mouth/Throat: Oropharynx is clear and moist. No oropharyngeal exudate.  Eyes: Conjunctivae are normal. Pupils are equal, round, and reactive to light. Right eye exhibits no discharge. Left eye exhibits no discharge. No scleral icterus.  Neck: Normal range of motion. Neck supple. No thyromegaly present.  Cardiovascular: Regular rhythm, normal heart sounds and intact distal pulses.  Exam reveals no gallop and no friction rub.   No murmur heard. Pulmonary/Chest: Effort normal and breath sounds normal. No stridor. No respiratory distress. He has no wheezes. He has no rales.  Abdominal: Soft. Bowel sounds are normal. He exhibits no distension. There is tenderness in the right upper quadrant and epigastric area. There is positive Murphy's sign. There is no rebound and no guarding.  Musculoskeletal: He exhibits no edema.  Lymphadenopathy:    He has no cervical adenopathy.  Neurological: He is alert. Coordination normal.  Skin: Skin is warm and dry. No rash noted. He is not diaphoretic. No pallor.  Psychiatric: He has a normal mood and affect.  Nursing note and vitals reviewed.    ED Treatments / Results  Labs (all labs ordered are listed, but only abnormal results are displayed) Labs Reviewed  COMPREHENSIVE METABOLIC PANEL - Abnormal; Notable for the following:       Result Value   Potassium 2.8 (*)    Glucose, Bld 118 (*)    Total Bilirubin 1.7 (*)    All other components within normal limits  URINALYSIS, ROUTINE  W REFLEX MICROSCOPIC - Abnormal; Notable for the following:    Color, Urine COLORLESS (*)    Specific Gravity, Urine 1.003 (*)    All other components within normal limits  LIPASE, BLOOD  CBC    EKG  EKG Interpretation None       Radiology No results found.  Procedures Procedures (including critical care time)  Medications Ordered in ED Medications  potassium chloride SA (K-DUR,KLOR-CON) CR tablet 40 mEq (40 mEq Oral Given 05/01/17 0706)  gi cocktail (Maalox,Lidocaine,Donnatal) (30 mLs Oral Given 05/01/17 9381)     Initial Impression / Assessment and Plan / ED Course  I have reviewed the triage vital signs and the nursing notes.  Pertinent labs & imaging results that were available during my care of the patient were reviewed by me and considered in my medical decision making (see chart for details).     Patient with chronic right upper quadrant and epigastric abdominal pain and burning. CBC unremarkable. CMP shows potassium 2.8 which is replaced in the ED and discharge patient home with  further replacement. Total bilirubin 1.7. Lipase 24. Considering patient's pain is unchanged, labs are stable, and patient has had CT abdomen and pelvis and ultrasound in the past month, no indication for imaging at this time. Patient's pain resolved in the ED with GI cocktail. Patient has a gastroenterology appointment in 6 days. We'll discharge home with Bentyl, Potassium, and continued Carafate and omeprazole. Return precautions discussed. Patient and daughter understand and agree with plan. Patient vitals stable throughout ED course and discharged in satisfactory condition.` I discussed patient case with Dr. Sabra Heck who guided the patient's management and agrees with plan.   Final Clinical Impressions(s) / ED Diagnoses   Final diagnoses:  Right upper quadrant abdominal pain    New Prescriptions Discharge Medication List as of 05/01/2017  9:58 AM    START taking these medications   Details    dicyclomine (BENTYL) 20 MG tablet Take 1 tablet (20 mg total) by mouth 2 (two) times daily., Starting Sat 05/01/2017, Print    potassium chloride SA (K-DUR,KLOR-CON) 20 MEQ tablet Take 1 tablet (20 mEq total) by mouth daily., Starting Sat 05/01/2017, [address] [place], PA-C 05/01/17 1615    Noemi Chapel, MD 05/02/17 1043

## 2017-05-01 NOTE — ED Triage Notes (Signed)
Pt to ED from home c/o recurrent heartburn and RUQ pain that is burning. Pt reports having this for over a month, takes antacids without relief. Pt denies N/V.

## 2017-05-01 NOTE — ED Notes (Signed)
Declined W/C at D/C and was escorted to lobby by RN. 

## 2017-05-01 NOTE — Discharge Instructions (Signed)
Medications: Bentyl, potassium  Treatment: Take Bentyl twice daily until seen by gastroenterology. Take potassium once daily for 5 days. Continue taking at home medications as prescribed. Avoid foods that seem to make your abdominal pain worse.  Follow-up: Please follow-up with the gastroenterologist at your scheduled appointment. Please return to the emergency department if you develop any worsening pain, intractable vomiting, or any other concerning symptoms.

## 2017-05-01 NOTE — ED Notes (Signed)
Pt states no relief with anything

## 2017-05-06 ENCOUNTER — Encounter: Payer: Self-pay | Admitting: *Deleted

## 2017-05-07 ENCOUNTER — Ambulatory Visit (INDEPENDENT_AMBULATORY_CARE_PROVIDER_SITE_OTHER): Payer: Medicaid Other | Admitting: Gastroenterology

## 2017-05-07 ENCOUNTER — Encounter: Payer: Self-pay | Admitting: Gastroenterology

## 2017-05-07 VITALS — BP 124/76 | HR 68 | Ht 62.5 in | Wt 164.0 lb

## 2017-05-07 DIAGNOSIS — R12 Heartburn: Secondary | ICD-10-CM

## 2017-05-07 DIAGNOSIS — R1011 Right upper quadrant pain: Secondary | ICD-10-CM | POA: Diagnosis not present

## 2017-05-07 DIAGNOSIS — Z1211 Encounter for screening for malignant neoplasm of colon: Secondary | ICD-10-CM | POA: Diagnosis not present

## 2017-05-07 DIAGNOSIS — R1013 Epigastric pain: Secondary | ICD-10-CM | POA: Diagnosis not present

## 2017-05-07 MED ORDER — NA SULFATE-K SULFATE-MG SULF 17.5-3.13-1.6 GM/177ML PO SOLN: 1.0000 | Freq: Once | ORAL | 0 refills | Status: AC

## 2017-05-07 NOTE — Patient Instructions (Signed)
You have been scheduled for an endoscopy and colonoscopy. Please follow the written instructions given to you at your visit today. Please pick up your prep supplies at the pharmacy within the next 1-3 days. If you use inhalers (even only as needed), please bring them with you on the day of your procedure. Your physician has requested that you go to www.startemmi.com and enter the access code given to you at your visit today. This web site gives a general overview about your procedure. However, you should still follow specific instructions given to you by our office regarding your preparation for the procedure.  Use FD GARD 1 capsule three times a day as needed  Continue Dexilant and Pepcid

## 2017-05-07 NOTE — Progress Notes (Signed)
Jorge [name]    [id number]    09/03/1951  Primary Care Physician:Default, Provider, MD  Referring Physician: Nolene [name], [name], [place] [address]  Chief complaint: RUQ abdominal pain  HPI:  66 year old male [place] speaking accompanied by interpreter is here for new patient visit with complaints of intermittent right upper quadrant pain radiating to the epigastric region and up his chest into the throat with burning sensation. He has been to ER multiple times in the past 3-4 months with same complaint. Patient sees his symptoms started after he had spinal surgery in December 2017. He denies any dysphagia, odynophagia, nausea or vomiting. He was given omeprazole and Carafate, did not help symptoms. On most recent ER visit was switched to [place], and Pepcid with no significant improvement. She denies any change in bowel habits, melena or blood per rectum. No family history of GI cancer or IBD. He never had EGD or colonoscopy. No significant acute abnormality noted on abdominal ultrasound or CT abdomen and pelvis   Outpatient Encounter Prescriptions as of 05/07/2017  Medication Sig  . amLODipine (NORVASC) 5 MG tablet Take 5 mg by mouth daily.  Marland Kitchen atorvastatin (LIPITOR) 40 MG tablet Take 40 mg by mouth daily.  Marland Kitchen dexlansoprazole (DEXILANT) 60 MG capsule Take 60 mg by mouth daily.  . diazepam (VALIUM) 10 MG tablet Take 1 tablet (10 mg total) by mouth every 8 (eight) hours as needed (muscle spasm).  Marland Kitchen dicyclomine (BENTYL) 20 MG tablet Take 1 tablet (20 mg total) by mouth 2 (two) times daily.  . famotidine (PEPCID) 40 MG tablet Take 1 tablet (40 mg total) by mouth at bedtime.  . polyethylene glycol (MIRALAX) packet Take 17 g by mouth daily. (Patient taking differently: Take 17 g by mouth daily as needed for mild constipation. )  . potassium chloride SA (K-DUR,KLOR-CON) 20 MEQ tablet Take 1 tablet (20 mEq total) by mouth daily.  . traMADol (ULTRAM) 50 MG tablet  Take 50 mg by mouth 2 (two) times daily.   . [DISCONTINUED] CARAFATE 1 GM/10ML suspension Take 1 g by mouth 3 (three) times daily.   . [DISCONTINUED] omeprazole (PRILOSEC) 20 MG capsule Take 1 capsule (20 mg total) by mouth daily. (Patient not taking: Reported on 05/01/2017)   No facility-administered encounter medications on file as of 05/07/2017.     Allergies as of 05/07/2017 - Review Complete 05/07/2017  Allergen Reaction Noted  . No known allergies  12/24/2016    Past Medical History:  Diagnosis Date  . Acid reflux   . Diabetes mellitus without complication ([place])   . Hypertension     Past Surgical History:  Procedure Laterality Date  . LUMBAR LAMINECTOMY/DECOMPRESSION MICRODISCECTOMY Right 12/25/2016   Procedure: Lumbar three-five Laminectomy, Right Lumbar three-four, Lumbar four-five Diskectomy ;  Surgeon: Jorge Ny Ditty, MD;  Location: [facility];  Service: Neurosurgery;  Laterality: Right;    History reviewed. No pertinent family history.  Social History   Social History  . Marital status: Married    Spouse name: N/A  . Number of children: N/A  . Years of education: N/A   Occupational History  . Not on file.   Social History Main Topics  . Smoking status: Former Smoker    Packs/day: 0.25    Years: 40.00    Quit date: 12/17/2011  . Smokeless tobacco: Never Used  . Alcohol use No  . Drug use: No  . Sexual activity: Not on file  Other Topics Concern  . Not on file   Social History Narrative  . No narrative on file      Review of systems: Review of Systems  Constitutional: Negative for fever and chills.  HENT: Negative.   Eyes: Negative for blurred vision.  Respiratory: Negative for cough, shortness of breath and wheezing.   Cardiovascular: Negative for chest pain and palpitations.  Gastrointestinal: as per HPI Genitourinary: Negative for dysuria, urgency, frequency and hematuria.  Musculoskeletal: Negative for myalgias, back pain and joint pain.    Skin: Negative for itching and rash.  Neurological: Negative for dizziness, tremors, focal weakness, seizures and loss of consciousness.  Endo/Heme/Allergies: Positive for seasonal allergies.  Psychiatric/Behavioral: Negative for depression, suicidal ideas and hallucinations.  All other systems reviewed and are negative.   Physical Exam: Vitals:   05/07/17 1347  BP: 124/76  Pulse: 68   Body mass index is 29.52 kg/m. Gen:      No acute distress HEENT:  EOMI, sclera anicteric Neck:     No masses; no thyromegaly Lungs:    Clear to auscultation bilaterally; normal respiratory effort CV:         Regular rate and rhythm; no murmurs  Abd:      + bowel sounds; soft, non-tender; no palpable masses, no distension Ext:    No edema; adequate peripheral perfusion Skin:      Warm and dry; no rash Neuro: alert and oriented x 3 Psych: normal mood and affect  Data Reviewed:  Reviewed labs, radiology imaging, old records and pertinent past GI work up  Assessment and Plan/Recommendations:  66 year old male with history of diabetes, hypertension here with complaints of right upper quadrant pain, epigastric pain and burning sensation in the throat since after his spinal surgery December 2017  No improvement in symptoms despite high-dose PPI and H2 blocker We will schedule for EGD for evaluation The risks and benefits as well as alternatives of endoscopic procedure(s) have been discussed and reviewed. All questions answered. The patient agrees to proceed. 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

## 2017-05-09 DIAGNOSIS — R1013 Epigastric pain: Secondary | ICD-10-CM | POA: Insufficient documentation

## 2017-05-15 ENCOUNTER — Emergency Department (HOSPITAL_COMMUNITY)
Admission: EM | Admit: 2017-05-15 | Discharge: 2017-05-15 | Disposition: A | Discharge status: Home / Self Care | Payer: Medicaid Other | Attending: Emergency Medicine | Admitting: Emergency Medicine

## 2017-05-15 DIAGNOSIS — Z87891 Personal history of nicotine dependence: Secondary | ICD-10-CM | POA: Diagnosis not present

## 2017-05-15 DIAGNOSIS — E119 Type 2 diabetes mellitus without complications: Secondary | ICD-10-CM | POA: Insufficient documentation

## 2017-05-15 DIAGNOSIS — R1013 Epigastric pain: Secondary | ICD-10-CM | POA: Diagnosis present

## 2017-05-15 DIAGNOSIS — I10 Essential (primary) hypertension: Secondary | ICD-10-CM | POA: Insufficient documentation

## 2017-05-15 DIAGNOSIS — Z79899 Other long term (current) drug therapy: Secondary | ICD-10-CM | POA: Diagnosis not present

## 2017-05-15 LAB — COMPREHENSIVE METABOLIC PANEL
ALBUMIN: 4.2 g/dL (ref 3.5–5.0)
ALT: 26 U/L (ref 17–63)
ANION GAP: 10 (ref 5–15)
AST: 28 U/L (ref 15–41)
Alkaline Phosphatase: 105 U/L (ref 38–126)
BILIRUBIN TOTAL: 1.5 mg/dL — AB (ref 0.3–1.2)
BUN: 5 mg/dL — ABNORMAL LOW (ref 6–20)
CALCIUM: 9.1 mg/dL (ref 8.9–10.3)
CO2: 21 mmol/L — ABNORMAL LOW (ref 22–32)
CREATININE: 0.75 mg/dL (ref 0.61–1.24)
Chloride: 109 mmol/L (ref 101–111)
GFR calc Af Amer: >60 mL/min (ref >60)
GFR calc non Af Amer: >60 mL/min (ref >60)
GLUCOSE: 96 mg/dL (ref 65–99)
POTASSIUM: 3.5 mmol/L (ref 3.5–5.1)
Sodium: 140 mmol/L (ref 135–145)
Total Protein: 7.1 g/dL (ref 6.5–8.1)

## 2017-05-15 LAB — URINALYSIS, ROUTINE W REFLEX MICROSCOPIC
Bilirubin Urine: NEGATIVE
Glucose, UA: NEGATIVE mg/dL
Hgb urine dipstick: NEGATIVE
Ketones, ur: NEGATIVE mg/dL
LEUKOCYTES UA: NEGATIVE
Nitrite: NEGATIVE
PROTEIN: NEGATIVE mg/dL
SPECIFIC GRAVITY, URINE: 1.003 — AB (ref 1.005–1.030)
pH: 7 (ref 5.0–8.0)

## 2017-05-15 LAB — CBC
HCT: 41.9 % (ref 39.0–52.0)
HEMOGLOBIN: 14.5 g/dL (ref 13.0–17.0)
MCH: 29.1 pg (ref 26.0–34.0)
MCHC: 34.6 g/dL (ref 30.0–36.0)
MCV: 84.1 fL (ref 78.0–100.0)
PLATELETS: 169 10*3/uL (ref 150–400)
RBC: 4.98 MIL/uL (ref 4.22–5.81)
RDW: 12.7 % (ref 11.5–15.5)
WBC: 4.5 10*3/uL (ref 4.0–10.5)

## 2017-05-15 LAB — LIPASE, BLOOD: LIPASE: 23 U/L (ref 11–51)

## 2017-05-15 MED ORDER — TRAMADOL HCL 50 MG PO TABS
50.0000 mg | ORAL_TABLET | Freq: Once | ORAL | Status: AC
  Administered 2017-05-15: 50 mg via ORAL
  Filled 2017-05-15: qty 1

## 2017-05-15 MED ORDER — TRAMADOL HCL 50 MG PO TABS: 50.0000 mg | ORAL_TABLET | Freq: Four times a day (QID) | ORAL | 0 refills | Status: DC | PRN

## 2017-05-15 NOTE — ED Triage Notes (Signed)
Pt reports recurrent heartburn and RUQ burning pain this morning.  He is being seen by a specialist but is unable to get in until the 24th.  Denies n/v/d.

## 2017-05-15 NOTE — ED Provider Notes (Signed)
[name] DEPT Provider Note   CSN: [id number] Arrival date & time: 05/15/17  0326     History   Chief Complaint Chief Complaint  Patient presents with  . Abdominal Pain    HPI Jorge [name] is a 66 y.o. male.  He presents for evaluation of burning chest pain, which started during the night.  Similar pain frequently in the past, and scheduled for upper and lower endoscopy on 05/20/2017.  Saw his GI doctor recently, and was recommended to Jorge [name], for functional dyspepsia, but has not started it yet.  He is also out of his tramadol, which she had been taking with help for this discomfort.  No vomiting, back pain, weakness or dizziness.  Interviewed through son who translates.  He was able to eat yesterday but has not eaten anything yet this morning.  There are no other known modifying factors.  HPI  Past Medical History:  Diagnosis Date  . Acid reflux   . Diabetes mellitus without complication (Jorge [name])   . Hypertension     Patient Active Problem List   Diagnosis Date Noted  . Abdominal pain, epigastric 05/09/2017  . Lumbosacral spondylosis with radiculopathy 12/25/2016    Past Surgical History:  Procedure Laterality Date  . LUMBAR LAMINECTOMY/DECOMPRESSION MICRODISCECTOMY Right 12/25/2016   Procedure: Lumbar three-five Laminectomy, Right Lumbar three-four, Lumbar four-five Diskectomy ;  Surgeon: Jorge Ny Ditty, [name];  Location: [facility];  Service: Neurosurgery;  Laterality: Right;       Home Medications    Prior to Admission medications   Medication Sig Start Date End Date Taking? Authorizing Provider  amLODipine (NORVASC) 5 MG tablet Take 5 mg by mouth daily. 07/16/16   Provider, Historical, [name]  atorvastatin (LIPITOR) 40 MG tablet Take 40 mg by mouth daily.    Provider, Historical, [name]  dexlansoprazole (DEXILANT) 60 MG capsule Take 60 mg by mouth daily.    Provider, Historical, [name]  diazepam (VALIUM) 10 MG tablet Take 1 tablet (10 mg total) by mouth every 8 (eight) hours  as needed (muscle spasm). 12/26/16   Jorge Larsson, [name]  dicyclomine (BENTYL) 20 MG tablet Take 1 tablet (20 mg total) by mouth 2 (two) times daily. 05/01/17   Jorge [name]  famotidine (PEPCID) 40 MG tablet Take 1 tablet (40 mg total) by mouth at bedtime. 04/18/17   Jorge [name], [name]  polyethylene glycol [facility]) packet Take 17 g by mouth daily. Patient taking differently: Take 17 g by mouth daily as needed for mild constipation.  12/10/16   Jorge [name]  potassium chloride SA (K-DUR,KLOR-CON) 20 MEQ tablet Take 1 tablet (20 mEq total) by mouth daily. 05/01/17   Jorge [name]  traMADol (ULTRAM) 50 MG tablet Take 50 mg by mouth 2 (two) times daily.  12/17/16   Provider, Historical, [name]    Family History No family history on file.  Social History Social History  Substance Use Topics  . Smoking status: Former Smoker    Packs/day: 0.25    Years: 40.00    Quit date: 12/17/2011  . Smokeless tobacco: Never Used  . Alcohol use No     Allergies   No known allergies   Review of Systems Review of Systems  All other systems reviewed and are negative.    Physical Exam Updated Vital Signs BP 139/82 (BP Location: Right Arm)   Pulse (!) 57   Temp 97.4 F (36.3 C) (Oral)   Resp 16   SpO2 97%  Physical Exam  Constitutional: He is oriented to person, place, and time. He appears well-developed and well-nourished. No distress.  HENT:  Head: Normocephalic and atraumatic.  Right Ear: External ear normal.  Left Ear: External ear normal.  Eyes: Conjunctivae and EOM are normal. Pupils are equal, round, and reactive to light.  Neck: Normal range of motion and phonation normal. Neck supple.  Cardiovascular: Normal rate, regular rhythm and normal heart sounds.   Pulmonary/Chest: Effort normal and breath sounds normal. He exhibits no bony tenderness.  Abdominal: Soft. There is no tenderness (Epigastric, mild).  Musculoskeletal: Normal range of motion.    Neurological: He is alert and oriented to person, place, and time. No cranial nerve deficit or sensory deficit. He exhibits normal muscle tone. Coordination normal.  Skin: Skin is warm, dry and intact.  Psychiatric: He has a normal mood and affect. His behavior is normal. Judgment and thought content normal.  Nursing note and vitals reviewed.    ED Treatments / Results  Labs (all labs ordered are listed, but only abnormal results are displayed) Labs Reviewed  COMPREHENSIVE METABOLIC PANEL - Abnormal; Notable for the following:       Result Value   CO2 21 (*)    BUN 5 (*)    Total Bilirubin 1.5 (*)    All other components within normal limits  URINALYSIS, ROUTINE W REFLEX MICROSCOPIC - Abnormal; Notable for the following:    Color, Urine STRAW (*)    Specific Gravity, Urine 1.003 (*)    All other components within normal limits  LIPASE, BLOOD  CBC    EKG  EKG Interpretation None       Radiology No results found.  Procedures Procedures (including critical care time)  Medications Ordered in ED Medications  traMADol (ULTRAM) tablet 50 mg (not administered)     Initial Impression / Assessment and Plan / ED Course  I have reviewed the triage vital signs and the nursing notes.  Pertinent labs & imaging results that were available during my care of the patient were reviewed by me and considered in my medical decision making (see chart for details).     Patient Vitals for the past 24 hrs:  BP Temp Temp src Pulse Resp SpO2  05/15/17 0732 139/82 - - (!) 57 16 97 %  05/15/17 0348 118/76 97.4 F (36.3 C) Oral (!) 57 (!) 22 96 %    8:35 AM Reevaluation with update and discussion. After initial assessment and treatment, an updated evaluation reveals no change in clinical status.  Findings discussed with patient and all questions answered. Jorge [name]    Final Clinical Impressions(s) / ED Diagnoses   Final diagnoses:  Dyspepsia    Evaluation consistent with  dyspepsia.  Doubt perforated viscus, unstable cardiac condition or impending vascular collapse.  Nursing Notes Reviewed/ Care Coordinated Applicable Imaging Reviewed Interpretation of Laboratory Data incorporated into ED treatment  The patient appears reasonably screened and/or stabilized for discharge and I doubt any other medical condition or other Brylin [name] requiring further screening, evaluation, or treatment in the ED at this time prior to discharge.  Plan: Home Medications-continue usual medications, start [place] as soon as possible; Home Treatments-rest, fluids, gradually advance diet; return here if the recommended treatment, does not improve the symptoms; Recommended follow up-GI as scheduled for endoscopy on 05/20/17   New Prescriptions New Prescriptions   No medications on file     Daleen Bo, [name] 05/15/17 [phone]

## 2017-05-15 NOTE — ED Notes (Signed)
Declined W/C at D/C and was escorted to lobby by RN. 

## 2017-05-15 NOTE — Discharge Instructions (Signed)
Use the recommended FD Donald Prose, 3 times a day to help with your abdominal and chest discomfort.

## 2017-05-19 ENCOUNTER — Emergency Department (HOSPITAL_COMMUNITY)
Admission: EM | Admit: 2017-05-19 | Discharge: 2017-05-19 | Disposition: A | Discharge status: Home / Self Care | Payer: Medicaid Other | Attending: Emergency Medicine | Admitting: Emergency Medicine

## 2017-05-19 ENCOUNTER — Encounter (HOSPITAL_COMMUNITY): Payer: Self-pay

## 2017-05-19 DIAGNOSIS — Z87891 Personal history of nicotine dependence: Secondary | ICD-10-CM | POA: Insufficient documentation

## 2017-05-19 DIAGNOSIS — E119 Type 2 diabetes mellitus without complications: Secondary | ICD-10-CM | POA: Diagnosis not present

## 2017-05-19 DIAGNOSIS — R1013 Epigastric pain: Secondary | ICD-10-CM

## 2017-05-19 DIAGNOSIS — I10 Essential (primary) hypertension: Secondary | ICD-10-CM | POA: Diagnosis not present

## 2017-05-19 DIAGNOSIS — Z79899 Other long term (current) drug therapy: Secondary | ICD-10-CM | POA: Diagnosis not present

## 2017-05-19 DIAGNOSIS — R109 Unspecified abdominal pain: Secondary | ICD-10-CM

## 2017-05-19 LAB — CBC WITH DIFFERENTIAL/PLATELET
BASOS ABS: 0 10*3/uL (ref 0.0–0.1)
Basophils Relative: 0 %
EOS ABS: 0.1 10*3/uL (ref 0.0–0.7)
EOS PCT: 2 %
HCT: 41.9 % (ref 39.0–52.0)
Hemoglobin: 14.9 g/dL (ref 13.0–17.0)
LYMPHS ABS: 1.6 10*3/uL (ref 0.7–4.0)
LYMPHS PCT: 32 %
MCH: 29.9 pg (ref 26.0–34.0)
MCHC: 35.6 g/dL (ref 30.0–36.0)
MCV: 84 fL (ref 78.0–100.0)
MONO ABS: 0.4 10*3/uL (ref 0.1–1.0)
Monocytes Relative: 8 %
Neutro Abs: 2.8 10*3/uL (ref 1.7–7.7)
Neutrophils Relative %: 58 %
PLATELETS: 150 10*3/uL (ref 150–400)
RBC: 4.99 MIL/uL (ref 4.22–5.81)
RDW: 12.6 % (ref 11.5–15.5)
WBC: 4.9 10*3/uL (ref 4.0–10.5)

## 2017-05-19 LAB — URINALYSIS, ROUTINE W REFLEX MICROSCOPIC
BILIRUBIN URINE: NEGATIVE
GLUCOSE, UA: NEGATIVE mg/dL
HGB URINE DIPSTICK: NEGATIVE
Ketones, ur: NEGATIVE mg/dL
Leukocytes, UA: NEGATIVE
Nitrite: NEGATIVE
PROTEIN: NEGATIVE mg/dL
Specific Gravity, Urine: 1.002 — ABNORMAL LOW (ref 1.005–1.030)
pH: 7 (ref 5.0–8.0)

## 2017-05-19 LAB — COMPREHENSIVE METABOLIC PANEL
ALT: 26 U/L (ref 17–63)
AST: 30 U/L (ref 15–41)
Albumin: 4.2 g/dL (ref 3.5–5.0)
Alkaline Phosphatase: 110 U/L (ref 38–126)
Anion gap: 7 (ref 5–15)
BUN: 8 mg/dL (ref 6–20)
CALCIUM: 9.3 mg/dL (ref 8.9–10.3)
CHLORIDE: 107 mmol/L (ref 101–111)
CO2: 25 mmol/L (ref 22–32)
Creatinine, Ser: 0.89 mg/dL (ref 0.61–1.24)
GLUCOSE: 121 mg/dL — AB (ref 65–99)
Potassium: 3.6 mmol/L (ref 3.5–5.1)
SODIUM: 139 mmol/L (ref 135–145)
TOTAL PROTEIN: 6.7 g/dL (ref 6.5–8.1)
Total Bilirubin: 1 mg/dL (ref 0.3–1.2)

## 2017-05-19 LAB — LIPASE, BLOOD: Lipase: 17 U/L (ref 11–51)

## 2017-05-19 MED ORDER — SUCRALFATE 1 G PO TABS
1.0000 g | ORAL_TABLET | Freq: Once | ORAL | Status: AC
  Administered 2017-05-19: 1 g via ORAL
  Filled 2017-05-19: qty 1

## 2017-05-19 MED ORDER — GI COCKTAIL ~~LOC~~
30.0000 mL | Freq: Once | ORAL | Status: AC
  Administered 2017-05-19: 30 mL via ORAL
  Filled 2017-05-19: qty 30

## 2017-05-19 NOTE — ED Triage Notes (Signed)
Per Pt, Pt is coming from home with complaints of right sided flank pain that has reoccured this morning. Pt has GI appt tomorrow. Pt reports right flank pain with burping, but denies nausea, vomiting, or diarrhea.

## 2017-05-19 NOTE — ED Provider Notes (Signed)
[facility] DEPT Provider Note   CSN: [id number] Arrival date & time: 05/19/17  0240     History   Chief Complaint Chief Complaint  Patient presents with  . Flank Pain    HPI WILGUS DEYTON is a 66 y.o. male.  The history is provided by the patient and medical records.  Flank Pain  Associated symptoms include abdominal pain.    66 year old male with history of acid reflux, diabetes, hypertension, presenting to the ED for abdominal pain. History is somewhat difficult to discern as patient speaks [place], his sons at the bedside interpreting. Patient has had several months of intermittent right-sided abdominal pain. States this is burning in nature associated with nausea, belching, and some hiccups. States pain got worse this morning which is why he came here. He is on home Pepcid as well as excellent but reports he has not had any significant relief.  States he has some nausea but none currently. No vomiting or diarrhea. No urinary symptoms. He denies any fever or chills. No chest pain or shortness of breath. Patient has appointment with GI tomorrow for endoscopy and further management of his symptoms.  Over the past 2 months patient has had abdominal imaging including ultrasound and CT scan without findings.  Past Medical History:  Diagnosis Date  . Acid reflux   . Diabetes mellitus without complication ([place])   . Hypertension     Patient Active Problem List   Diagnosis Date Noted  . Abdominal pain, epigastric 05/09/2017  . Lumbosacral spondylosis with radiculopathy 12/25/2016    Past Surgical History:  Procedure Laterality Date  . LUMBAR LAMINECTOMY/DECOMPRESSION MICRODISCECTOMY Right 12/25/2016   Procedure: Lumbar three-five Laminectomy, Right Lumbar three-four, Lumbar four-five Diskectomy ;  Surgeon: Kevan Ny Ditty, MD;  Location: [facility];  Service: Neurosurgery;  Laterality: Right;       Home Medications    Prior to Admission medications   Medication Sig Start  Date End Date Taking? Authorizing Provider  amLODipine (NORVASC) 5 MG tablet Take 5 mg by mouth daily. 07/16/16   [provider]  atorvastatin (LIPITOR) 40 MG tablet Take 40 mg by mouth daily.    [provider]  dexlansoprazole (DEXILANT) 60 MG capsule Take 60 mg by mouth daily.    [provider]  diazepam (VALIUM) 10 MG tablet Take 1 tablet (10 mg total) by mouth every 8 (eight) hours as needed (muscle spasm). 12/26/16   Earnie Larsson, MD  dicyclomine (BENTYL) 20 MG tablet Take 1 tablet (20 mg total) by mouth 2 (two) times daily. 05/01/17   Law, Bea Graff, PA-C  famotidine (PEPCID) 40 MG tablet Take 1 tablet (40 mg total) by mouth at bedtime. 04/18/17   Nona Dell, PA-C  polyethylene glycol [facility]) packet Take 17 g by mouth daily. Patient taking differently: Take 17 g by mouth daily as needed for mild constipation.  12/10/16   Horton, Barbette Hair, MD  potassium chloride SA (K-DUR,KLOR-CON) 20 MEQ tablet Take 1 tablet (20 mEq total) by mouth daily. 05/01/17   Law, Bea Graff, PA-C  traMADol (ULTRAM) 50 MG tablet Take 1 tablet (50 mg total) by mouth every 6 (six) hours as needed. 05/15/17   Daleen Bo, MD    Family History No family history on file.  Social History Social History  Substance Use Topics  . Smoking status: Former Smoker    Packs/day: 0.25    Years: 40.00    Quit date: 12/17/2011  . Smokeless tobacco: Never Used  . Alcohol  use No     Allergies   No known allergies   Review of Systems Review of Systems  Gastrointestinal: Positive for abdominal pain and nausea.       + hiccups, burping  All other systems reviewed and are negative.    Physical Exam Updated Vital Signs BP (!) 144/86 (BP Location: Left Arm)   Pulse 60   Temp 97.8 F (36.6 C) (Oral)   Resp 16   Ht 5' 2.5" (1.588 m)   Wt 74.4 kg (164 lb)   SpO2 99%   BMI 29.52 kg/m   Physical Exam  Constitutional: He is oriented to person, place, and time. He  appears well-developed and well-nourished.  No acute distress, hiccuping during exam  HENT:  Head: Normocephalic and atraumatic.  Mouth/Throat: Oropharynx is clear and moist.  Eyes: Conjunctivae and EOM are normal. Pupils are equal, round, and reactive to light.  Neck: Normal range of motion.  Cardiovascular: Normal rate, regular rhythm and normal heart sounds.   Pulmonary/Chest: Effort normal and breath sounds normal.  Abdominal: Soft. Bowel sounds are normal. There is tenderness in the right upper quadrant and epigastric area.  Musculoskeletal: Normal range of motion.  Neurological: He is alert and oriented to person, place, and time.  Skin: Skin is warm and dry.  Psychiatric: He has a normal mood and affect.  Nursing note and vitals reviewed.    ED Treatments / Results  Labs (all labs ordered are listed, but only abnormal results are displayed) Labs Reviewed  URINALYSIS, ROUTINE W REFLEX MICROSCOPIC - Abnormal; Notable for the following:       Result Value   Color, Urine COLORLESS (*)    Specific Gravity, Urine 1.002 (*)    All other components within normal limits  COMPREHENSIVE METABOLIC PANEL - Abnormal; Notable for the following:    Glucose, Bld 121 (*)    All other components within normal limits  CBC WITH DIFFERENTIAL/PLATELET  LIPASE, BLOOD    EKG  EKG Interpretation None       Radiology No results found.  Procedures Procedures (including critical care time)  Medications Ordered in ED Medications  gi cocktail (Maalox,Lidocaine,Donnatal) (30 mLs Oral Given 05/19/17 0922)  sucralfate (CARAFATE) tablet 1 g (1 g Oral Given 05/19/17 0922)  gi cocktail (Maalox,Lidocaine,Donnatal) (30 mLs Oral Given 05/19/17 1404)     Initial Impression / Assessment and Plan / ED Course  I have reviewed the triage vital signs and the nursing notes.  Pertinent labs & imaging results that were available during my care of the patient were reviewed by me and considered in my  medical decision making (see chart for details).  66 year old male here with abdominal pain. This is been a recurrent issue for him over the past few months, has had multiple prior ED visits for similar. He is afebrile and nontoxic. Some mild tenderness in the right upper quadrant epigastrium without rebound or guarding. No peritoneal signs. Screening lab work is overall reassuring. UA without signs of infection or hematuria suggestive of kidney stone. Patient treated here with Carafate, GI cocktail, and Zofran with improvement of symptoms. Feel his symptoms are likely secondary to his recurrent dyspepsia/GERD.  Lower suspicion for acute or surgical abdominal pathology. He has previous schedule follow-up with his GI physician tomorrow for endoscopy. Will have him continue his Pepcid and dexilant.  Discussed plan with patient, he acknowledged understanding and agreed with plan of care.  Return precautions given for new or worsening symptoms.  Final Clinical Impressions(s) /  ED Diagnoses   Final diagnoses:  Abdominal pain, unspecified abdominal location    New Prescriptions Discharge Medication List as of 05/19/2017  2:08 PM       Larene Pickett, PA-C 05/19/17 1451    Lacretia Leigh, MD 05/19/17 1525

## 2017-05-19 NOTE — Discharge Instructions (Signed)
Continue your dexilant and pepcid. Follow-up with your GI doctor tomorrow as scheduled. Return here for new concerns.

## 2017-05-20 ENCOUNTER — Encounter: Payer: Self-pay | Admitting: Gastroenterology

## 2017-05-20 ENCOUNTER — Ambulatory Visit (AMBULATORY_SURGERY_CENTER): Payer: Medicaid Other | Admitting: Gastroenterology

## 2017-05-20 VITALS — BP 155/82 | HR 58 | Temp 98.2°F | Resp 18 | Ht 62.5 in | Wt 164.0 lb

## 2017-05-20 DIAGNOSIS — K319 Disease of stomach and duodenum, unspecified: Secondary | ICD-10-CM | POA: Diagnosis not present

## 2017-05-20 DIAGNOSIS — Z1211 Encounter for screening for malignant neoplasm of colon: Secondary | ICD-10-CM

## 2017-05-20 DIAGNOSIS — R1013 Epigastric pain: Secondary | ICD-10-CM | POA: Diagnosis not present

## 2017-05-20 MED ORDER — SODIUM CHLORIDE 0.9 % IV SOLN: 500.0000 mL | INTRAVENOUS | Status: DC

## 2017-05-20 NOTE — Progress Notes (Signed)
Pt speaks Nepali - and has interpreter Bynum Bellows from [employer] maw

## 2017-05-20 NOTE — Patient Instructions (Signed)
Discharge instructions given. Biopsies taken. Reschedules colonoscopy. No ibuprofen,naproxen,or other non-steroidal anti-inflammatory drugs for 2 weeks. Resume previous medications. YOU HAD AN ENDOSCOPIC PROCEDURE TODAY AT [facility]:   Refer to the procedure report that was given to you for any specific questions about what was found during the examination.  If the procedure report does not answer your questions, please call your gastroenterologist to clarify.  If you requested that your care partner not be given the details of your procedure findings, then the procedure report has been included in a sealed envelope for you to review at your convenience later.  YOU SHOULD EXPECT: Some feelings of bloating in the abdomen. Passage of more gas than usual.  Walking can help get rid of the air that was put into your GI tract during the procedure and reduce the bloating. If you had a lower endoscopy (such as a colonoscopy or flexible sigmoidoscopy) you may notice spotting of blood in your stool or on the toilet paper. If you underwent a bowel prep for your procedure, you may not have a normal bowel movement for a few days.  Please Note:  You might notice some irritation and congestion in your nose or some drainage.  This is from the oxygen used during your procedure.  There is no need for concern and it should clear up in a day or so.  SYMPTOMS TO REPORT IMMEDIATELY:    Following upper endoscopy (EGD)  Vomiting of blood or coffee ground material  New chest pain or pain under the shoulder blades  Painful or persistently difficult swallowing  New shortness of breath  Fever of 100F or higher  Black, tarry-looking stools  For urgent or emergent issues, a gastroenterologist can be reached at any hour by calling [phone].   DIET:  We do recommend a small meal at first, but then you may proceed to your regular diet.  Drink plenty of fluids but you should avoid alcoholic  beverages for 24 hours.  ACTIVITY:  You should plan to take it easy for the rest of today and you should NOT DRIVE or use heavy machinery until tomorrow (because of the sedation medicines used during the test).    FOLLOW UP: Our staff will call the number listed on your records the next business day following your procedure to check on you and address any questions or concerns that you may have regarding the information given to you following your procedure. If we do not reach you, we will leave a message.  However, if you are feeling well and you are not experiencing any problems, there is no need to return our call.  We will assume that you have returned to your regular daily activities without incident.  If any biopsies were taken you will be contacted by phone or by letter within the next 1-3 weeks.  Please call us at [phone] if you have not heard about the biopsies in 3 weeks.    SIGNATURES/CONFIDENTIALITY: You and/or your care partner have signed paperwork which will be entered into your electronic medical record.  These signatures attest to the fact that that the information above on your After Visit Summary has been reviewed and is understood.  Full responsibility of the confidentiality of this discharge information lies with you and/or your care-partner.

## 2017-05-20 NOTE — Op Note (Addendum)
[facility] Patient Name: Jorge [name] Procedure Date: 05/20/2017 10:04 AM MRN: [id number] Endoscopist: Mauri Pole , MD Age: 66 Referring MD:  Date of Birth: 1951-12-02 Gender: Male Account #: [id number] Procedure:                Upper GI endoscopy Indications:              Upper abdominal symptoms that persist despite an                            appropriate trial of therapy, New-onset upper                            abdominal symptoms in patient older than 50 years,                            Epigastric abdominal pain, Upper abdominal pain Medicines:                Monitored Anesthesia Care Procedure:                Pre-Anesthesia Assessment:                           - Prior to the procedure, a History and Physical                            was performed, and patient medications and                            allergies were reviewed. The patient's tolerance of                            previous anesthesia was also reviewed. The risks                            and benefits of the procedure and the sedation                            options and risks were discussed with the patient.                            All questions were answered, and informed consent                            was obtained. Prior Anticoagulants: The patient has                            taken no previous anticoagulant or antiplatelet                            agents. ASA Grade Assessment: II - A patient with                            mild systemic disease. After reviewing the risks  and benefits, the patient was deemed in                            satisfactory condition to undergo the procedure.                           After obtaining informed consent, the endoscope was                            passed under direct vision. Throughout the                            procedure, the patient's blood pressure, pulse, and                            oxygen  saturations were monitored continuously. The                            Endoscope was introduced through the mouth, and                            advanced to the second part of duodenum. The upper                            GI endoscopy was accomplished without difficulty.                            The patient tolerated the procedure well. Scope In: Scope Out: Findings:                 The esophagus was normal.                           Scattered moderate inflammation characterized by                            congestion (edema), erythema and friability was                            found in the entire examined stomach. Biopsies were                            taken with a cold forceps for histology.                           The examined duodenum was normal. Complications:            No immediate complications. Estimated Blood Loss:     Estimated blood loss was minimal. Impression:               - Normal esophagus.                           - Gastritis. Biopsied.                           -  Normal examined duodenum. Recommendation:           - Resume previous diet.                           - Continue present medications.                           - Await pathology results.                           - No ibuprofen, naproxen, or other non-steroidal                            anti-inflammatory drugs.                           - Reschedule colonoscopy as patient did not prep                            adequately. Mauri Pole, MD 05/20/2017 10:29:45 AM This report has been signed electronically.

## 2017-05-20 NOTE — Progress Notes (Signed)
Pt reported he did drink both bottles of prep, but did not drink water after drinking the prep.  Pt ate 1 cup of rice yesterday at 15:00.  Pt was seen at [facility] ED yesterday for abdominal pain per pt.  Reported these findings to Dr. Silverio Decamp and Lafe Garin, CRNA at 09:45. Jorge [name]

## 2017-05-20 NOTE — Progress Notes (Signed)
Report given to PACU, vss 

## 2017-05-21 ENCOUNTER — Telehealth: Payer: Self-pay | Admitting: *Deleted

## 2017-05-21 NOTE — Telephone Encounter (Signed)
  Follow up Call-  Call back number 05/20/2017  Post procedure Call Back phone  # daughter - Lollie Sails she speaks [place]olt [phone]  Permission to leave phone message Yes  Some recent data might be hidden     Patient questions:  Do you have a fever, pain , or abdominal swelling? No. Pain Score  0 *  Have you tolerated food without any problems? Yes.    Have you been able to return to your normal activities? Yes.    Do you have any questions about your discharge instructions: Diet   No. Medications  No. Follow up visit  No.  Do you have questions or concerns about your Care? No.  Actions: * If pain score is 4 or above: No action needed, pain <4.  Spoke with patients daughter.  She states that he is having pain in his chest and rates it a 2 or 3.  He has no blood, has tolerated food ok and has no fever.  He will let us know if symptoms worsen.

## 2017-05-25 ENCOUNTER — Telehealth: Payer: Self-pay | Admitting: Gastroenterology

## 2017-05-25 NOTE — Telephone Encounter (Signed)
Spoke with the daughter. She will send his son to pick up samples of IB Gard. Written instructions provided in addition to discussing the instructions.

## 2017-05-25 NOTE — Telephone Encounter (Signed)
He has had extensive work up so far. Await biopsy results. Colonoscopy wasn't performed as patient didn't prep as per instructions. Please advise patient to continue current regimen. IB Gard 1 capsule TID as needed

## 2017-05-25 NOTE — Telephone Encounter (Signed)
Daughter translates for the patient. He has taken Dexilant and Bentyl this morning. He has eaten only rice. He has some burning in the area above his umbilicus. He states he has pain in this area as well. Symptoms are unchanged from what he has been experiencing. Biopsy results are not back yet.

## 2017-05-26 ENCOUNTER — Emergency Department (HOSPITAL_COMMUNITY)
Admission: EM | Admit: 2017-05-26 | Discharge: 2017-05-26 | Disposition: A | Discharge status: Home / Self Care | Payer: Medicaid Other | Attending: Emergency Medicine | Admitting: Emergency Medicine

## 2017-05-26 ENCOUNTER — Encounter (HOSPITAL_COMMUNITY): Payer: Self-pay | Admitting: Emergency Medicine

## 2017-05-26 DIAGNOSIS — Z87891 Personal history of nicotine dependence: Secondary | ICD-10-CM | POA: Insufficient documentation

## 2017-05-26 DIAGNOSIS — R1013 Epigastric pain: Secondary | ICD-10-CM

## 2017-05-26 DIAGNOSIS — Z79899 Other long term (current) drug therapy: Secondary | ICD-10-CM | POA: Insufficient documentation

## 2017-05-26 DIAGNOSIS — E119 Type 2 diabetes mellitus without complications: Secondary | ICD-10-CM | POA: Insufficient documentation

## 2017-05-26 DIAGNOSIS — I10 Essential (primary) hypertension: Secondary | ICD-10-CM | POA: Insufficient documentation

## 2017-05-26 LAB — CBC WITH DIFFERENTIAL/PLATELET
BASOS ABS: 0 10*3/uL (ref 0.0–0.1)
BASOS PCT: 0 %
EOS ABS: 0.1 10*3/uL (ref 0.0–0.7)
Eosinophils Relative: 1 %
HEMATOCRIT: 43.9 % (ref 39.0–52.0)
Hemoglobin: 15.3 g/dL (ref 13.0–17.0)
Lymphocytes Relative: 28 %
Lymphs Abs: 1.5 10*3/uL (ref 0.7–4.0)
MCH: 29.5 pg (ref 26.0–34.0)
MCHC: 34.9 g/dL (ref 30.0–36.0)
MCV: 84.6 fL (ref 78.0–100.0)
MONOS PCT: 6 %
Monocytes Absolute: 0.3 10*3/uL (ref 0.1–1.0)
NEUTROS ABS: 3.4 10*3/uL (ref 1.7–7.7)
NEUTROS PCT: 65 %
Platelets: 160 10*3/uL (ref 150–400)
RBC: 5.19 MIL/uL (ref 4.22–5.81)
RDW: 12.6 % (ref 11.5–15.5)
WBC: 5.2 10*3/uL (ref 4.0–10.5)

## 2017-05-26 LAB — COMPREHENSIVE METABOLIC PANEL
ALBUMIN: 4.2 g/dL (ref 3.5–5.0)
ALT: 26 U/L (ref 17–63)
ANION GAP: 8 (ref 5–15)
AST: 31 U/L (ref 15–41)
Alkaline Phosphatase: 109 U/L (ref 38–126)
BILIRUBIN TOTAL: 1.3 mg/dL — AB (ref 0.3–1.2)
BUN: 8 mg/dL (ref 6–20)
CALCIUM: 9.3 mg/dL (ref 8.9–10.3)
CO2: 24 mmol/L (ref 22–32)
Chloride: 105 mmol/L (ref 101–111)
Creatinine, Ser: 0.85 mg/dL (ref 0.61–1.24)
GFR calc non Af Amer: >60 mL/min (ref >60)
Glucose, Bld: 141 mg/dL — ABNORMAL HIGH (ref 65–99)
Potassium: 3.6 mmol/L (ref 3.5–5.1)
Sodium: 137 mmol/L (ref 135–145)
TOTAL PROTEIN: 6.9 g/dL (ref 6.5–8.1)

## 2017-05-26 LAB — LIPASE, BLOOD: Lipase: 17 U/L (ref 11–51)

## 2017-05-26 MED ORDER — GI COCKTAIL ~~LOC~~
30.0000 mL | Freq: Once | ORAL | Status: AC
  Administered 2017-05-26: 30 mL via ORAL
  Filled 2017-05-26: qty 30

## 2017-05-26 MED ORDER — SUCRALFATE 1 GM/10ML PO SUSP: 1.0000 g | Freq: Three times a day (TID) | ORAL | 0 refills | Status: DC

## 2017-05-26 NOTE — ED Provider Notes (Signed)
[facility] [place] DEPT Provider Note   CSN: [id number] Arrival date & time: 05/26/17  0865     History   Chief Complaint Chief Complaint  Patient presents with  . Abdominal Pain    HPI Jorge [name] is a 66 y.o. male.  HPI  He presents with concern of abdominal pain. Patient is [place], his daughter assists as Optometrist, per his request. He notes that he has had abdominal pain for a long time, with frequent exacerbations and is currently being evaluated by gastroenterology. Patient had endoscopy last week, was started on new medication, peppermint oil, which he began yesterday. [facility] is scheduled for colonoscopy next week. He notes over the past day has developed recurrent abdominal pain, in the epigastrium, right upper quadrant, radiating superiorly with a burning sensation. There is associated nausea, but no vomiting, no diarrhea. No anorexia. No fever, chills, other chest pain. No relief in spite of taking medication as directed including the aforementioned peppermint oil, as well as PPI   Past Medical History:  Diagnosis Date  . Acid reflux   . Arthritis    right leg  . Cataract    bil removed  . Diabetes mellitus without complication ([place])   . Hypertension     Patient Active Problem List   Diagnosis Date Noted  . Abdominal pain, epigastric 05/09/2017  . Lumbosacral spondylosis with radiculopathy 12/25/2016    Past Surgical History:  Procedure Laterality Date  . LUMBAR LAMINECTOMY/DECOMPRESSION MICRODISCECTOMY Right 12/25/2016   Procedure: Lumbar three-five Laminectomy, Right Lumbar three-four, Lumbar four-five Diskectomy ;  Surgeon: Kevan Ny Ditty, MD;  Location: [facility];  Service: Neurosurgery;  Laterality: Right;       Home Medications    Prior to Admission medications   Medication Sig Start Date End Date Taking? Authorizing Provider  amLODipine (NORVASC) 5 MG tablet Take 5 mg by mouth daily. 07/16/16   [provider]  atorvastatin  (LIPITOR) 40 MG tablet Take 40 mg by mouth daily.    [provider]  dexlansoprazole (DEXILANT) 60 MG capsule Take 60 mg by mouth daily.    [provider]  diazepam (VALIUM) 10 MG tablet Take 1 tablet (10 mg total) by mouth every 8 (eight) hours as needed (muscle spasm). 12/26/16   Earnie Larsson, MD  dicyclomine (BENTYL) 20 MG tablet Take 1 tablet (20 mg total) by mouth 2 (two) times daily. 05/01/17   Law, Bea Graff, PA-C  famotidine (PEPCID) 40 MG tablet Take 1 tablet (40 mg total) by mouth at bedtime. 04/18/17   Nona Dell, PA-C  polyethylene glycol [facility]) packet Take 17 g by mouth daily. Patient taking differently: Take 17 g by mouth daily as needed for mild constipation.  12/10/16   Horton, Barbette Hair, MD  potassium chloride SA (K-DUR,KLOR-CON) 20 MEQ tablet Take 1 tablet (20 mEq total) by mouth daily. 05/01/17   Law, Bea Graff, PA-C  traMADol (ULTRAM) 50 MG tablet Take 1 tablet (50 mg total) by mouth every 6 (six) hours as needed. 05/15/17   Daleen Bo, MD    Family History Family History  Problem Relation Age of Onset  . Colon cancer Neg Hx   . Esophageal cancer Neg Hx   . Pancreatic cancer Neg Hx   . Prostate cancer Neg Hx   . Rectal cancer Neg Hx   . Stomach cancer Neg Hx     Social History Social History  Substance Use Topics  . Smoking status: Former Smoker    Packs/day: 0.25  Years: 40.00    Quit date: 12/17/2011  . Smokeless tobacco: Never Used  . Alcohol use No     Allergies   No known allergies   Review of Systems Review of Systems  Constitutional:       Per HPI, otherwise negative  HENT:       Per HPI, otherwise negative  Respiratory:       Per HPI, otherwise negative  Cardiovascular:       Per HPI, otherwise negative  Gastrointestinal: Positive for abdominal pain and nausea. Negative for vomiting.  Endocrine:       Negative aside from HPI  Genitourinary:       Neg aside from HPI   Musculoskeletal:       Per  HPI, otherwise negative  Skin: Negative.   Neurological: Negative for syncope.     Physical Exam Updated Vital Signs BP (!) 132/91   Pulse (!) 55   Temp 98.1 F (36.7 C) (Oral)   Resp 20   SpO2 94%   Physical Exam  Constitutional: He is oriented to person, place, and time. He appears well-developed. No distress.  HENT:  Head: Normocephalic and atraumatic.  Eyes: Conjunctivae and EOM are normal.  Cardiovascular: Normal rate and regular rhythm.   Pulmonary/Chest: Effort normal. No stridor. No respiratory distress.  Abdominal: He exhibits no distension and no mass. There is no tenderness. There is no rebound and no guarding.  Minimal discomfort with palpation about the epigastrium  Musculoskeletal: He exhibits no edema.  Neurological: He is alert and oriented to person, place, and time.  Skin: Skin is warm and dry.  Psychiatric: He has a normal mood and affect.  Nursing note and vitals reviewed.    ED Treatments / Results  Labs (all labs ordered are listed, but only abnormal results are displayed) Labs Reviewed  COMPREHENSIVE METABOLIC PANEL - Abnormal; Notable for the following:       Result Value   Glucose, Bld 141 (*)    Total Bilirubin 1.3 (*)    All other components within normal limits  LIPASE, BLOOD  CBC WITH DIFFERENTIAL/PLATELET    Paperwork provided by the patient demonstrates recent endoscopy results with photos notable for gastritis.   Procedures Procedures (including critical care time)  Medications Ordered in ED Medications  gi cocktail (Maalox,Lidocaine,Donnatal) (30 mLs Oral Given 05/26/17 1011)     Initial Impression / Assessment and Plan / ED Course  I have reviewed the triage vital signs and the nursing notes.  Pertinent labs & imaging results that were available during my care of the patient were reviewed by me and considered in my medical decision making (see chart for details).  On repeat exam the patient is awake and alert, in no  distress, pain has resolved. Patient and daughter are aware of all findings, importance of continued follow-up with GI. Now with resolution of his pain, no ongoing complaints, the patient discharged in stable condition.  Final Clinical Impressions(s) / ED Diagnoses  Abdominal pain, epigastric   Carmin Muskrat, MD 05/26/17 1141

## 2017-05-26 NOTE — ED Triage Notes (Signed)
Pt sts RUQ pain x months; pt seen multiple times for same

## 2017-05-26 NOTE — Discharge Instructions (Signed)
As discussed, your evaluation today has been largely reassuring.  But, it is important that you monitor your condition carefully, and do not hesitate to return to the ED if you develop new, or concerning changes in your condition. ? ?Otherwise, please follow-up with your physician for appropriate ongoing care. ? ?

## 2017-05-27 ENCOUNTER — Encounter: Payer: Self-pay | Admitting: Gastroenterology

## 2017-06-02 ENCOUNTER — Telehealth: Payer: Self-pay | Admitting: Gastroenterology

## 2017-06-03 NOTE — Telephone Encounter (Signed)
Spoke with the son. Discussed "chronic gastritis" and medications the patient is taking. Confirmed with the pharmacist the patient has not filled the [place] since April.  The son will go to the pharmacy for the [place] and have the patient to start taking as per directions.

## 2017-06-03 NOTE — Telephone Encounter (Signed)
Left message on sons voicemail  

## 2017-06-09 ENCOUNTER — Ambulatory Visit (AMBULATORY_SURGERY_CENTER): Payer: Self-pay

## 2017-06-09 VITALS — Ht 64.0 in | Wt 164.2 lb

## 2017-06-09 DIAGNOSIS — Z1211 Encounter for screening for malignant neoplasm of colon: Secondary | ICD-10-CM

## 2017-06-09 MED ORDER — NA SULFATE-K SULFATE-MG SULF 17.5-3.13-1.6 GM/177ML PO SOLN
1.0000 | Freq: Once | ORAL | 0 refills | Status: AC
Start: 2017-06-09 — End: 2017-06-09

## 2017-06-09 NOTE — Progress Notes (Signed)
Denies allergies to eggs or soy products. Denies complication of anesthesia or sedation. Denies use of weight loss medication. Denies use of O2.   Emmi instructions declined. Does not understand [place].   PV instructions were given to the patients daughter Jorge [name] and Jorge [name] from language resources. Patient verbalizes the instructions for drinking Suprep and following with two cups of water.

## 2017-06-10 ENCOUNTER — Encounter (HOSPITAL_COMMUNITY): Payer: Self-pay

## 2017-06-10 ENCOUNTER — Emergency Department (HOSPITAL_COMMUNITY)
Admission: EM | Admit: 2017-06-10 | Discharge: 2017-06-10 | Disposition: A | Discharge status: Home / Self Care | Payer: Medicaid Other | Attending: Emergency Medicine | Admitting: Emergency Medicine

## 2017-06-10 DIAGNOSIS — Z79899 Other long term (current) drug therapy: Secondary | ICD-10-CM | POA: Diagnosis not present

## 2017-06-10 DIAGNOSIS — R1084 Generalized abdominal pain: Secondary | ICD-10-CM | POA: Diagnosis present

## 2017-06-10 DIAGNOSIS — K2950 Unspecified chronic gastritis without bleeding: Secondary | ICD-10-CM | POA: Diagnosis not present

## 2017-06-10 DIAGNOSIS — Z87891 Personal history of nicotine dependence: Secondary | ICD-10-CM | POA: Diagnosis not present

## 2017-06-10 DIAGNOSIS — E119 Type 2 diabetes mellitus without complications: Secondary | ICD-10-CM | POA: Diagnosis not present

## 2017-06-10 DIAGNOSIS — I10 Essential (primary) hypertension: Secondary | ICD-10-CM | POA: Insufficient documentation

## 2017-06-10 DIAGNOSIS — K279 Peptic ulcer, site unspecified, unspecified as acute or chronic, without hemorrhage or perforation: Secondary | ICD-10-CM | POA: Diagnosis not present

## 2017-06-10 MED ORDER — PANTOPRAZOLE SODIUM 40 MG PO TBEC
80.0000 mg | DELAYED_RELEASE_TABLET | Freq: Every day | ORAL | Status: DC
  Administered 2017-06-10: 80 mg via ORAL
  Filled 2017-06-10: qty 2

## 2017-06-10 MED ORDER — GI COCKTAIL ~~LOC~~
30.0000 mL | Freq: Once | ORAL | Status: AC
  Administered 2017-06-10: 30 mL via ORAL
  Filled 2017-06-10: qty 30

## 2017-06-10 MED ORDER — PANTOPRAZOLE SODIUM 40 MG PO TBEC: 40.0000 mg | DELAYED_RELEASE_TABLET | Freq: Every day | ORAL | 0 refills | Status: DC

## 2017-06-10 NOTE — ED Notes (Signed)
Pt stable, ambulatory, states understanding of discharge instructions, family at bedside. 

## 2017-06-10 NOTE — ED Triage Notes (Signed)
Pt endorses burning in the abd that goes up to his throat. Pt has hx of reflux and has been seen here multiples times for same. Denies vomiting or fevers. VSS.

## 2017-06-10 NOTE — ED Provider Notes (Signed)
[place] DEPT Provider Note   CSN: [id number] Arrival date & time: 06/10/17  1714     History   Chief Complaint Chief Complaint  Patient presents with  . Abdominal Pain    HPI Jorge [name] is a 66 y.o. male.  HPI Pt comes in with cc of abd pain. Pt wants his son to translate - option for translation given.  Pt has hx of DM. Pt's pain has been going on intermittently for the past 2 weeks. Pain is intermittent, but fairly constant and each episode lasting for 5 min.  Pt's pain is burning. Pt's pain is worse with po intake. No n/v/f/c/diarrhea. Pt has had CT scan of the abdomen, US of the abdomen, and EGD which were all neg, but showed gastritis.   Past Medical History:  Diagnosis Date  . Acid reflux   . Arthritis    right leg  . Cataract    bil removed  . Diabetes mellitus without complication ([place])   . Hypertension     Patient Active Problem List   Diagnosis Date Noted  . Abdominal pain, epigastric 05/09/2017  . Lumbosacral spondylosis with radiculopathy 12/25/2016    Past Surgical History:  Procedure Laterality Date  . cataracts Bilateral   . LUMBAR LAMINECTOMY/DECOMPRESSION MICRODISCECTOMY Right 12/25/2016   Procedure: Lumbar three-five Laminectomy, Right Lumbar three-four, Lumbar four-five Diskectomy ;  Surgeon: Kevan Ny Ditty, MD;  Location: [facility];  Service: Neurosurgery;  Laterality: Right;       Home Medications    Prior to Admission medications   Medication Sig Start Date End Date Taking? Authorizing Provider  amLODipine (NORVASC) 5 MG tablet Take 5 mg by mouth daily. 07/16/16  Yes [provider]  atorvastatin (LIPITOR) 40 MG tablet Take 40 mg by mouth daily.   Yes [provider]  diazepam (VALIUM) 10 MG tablet Take 1 tablet (10 mg total) by mouth every 8 (eight) hours as needed (muscle spasm). 12/26/16  Yes Pool, Mallie Mussel, MD  traMADol (ULTRAM) 50 MG tablet Take 1 tablet (50 mg total) by mouth every 6 (six) hours as needed.  05/15/17  Yes Daleen Bo, MD  dexlansoprazole (DEXILANT) 60 MG capsule Take 60 mg by mouth daily.    [provider]  dicyclomine (BENTYL) 20 MG tablet Take 1 tablet (20 mg total) by mouth 2 (two) times daily. 05/01/17   Law, Bea Graff, PA-C  famotidine (PEPCID) 40 MG tablet Take 1 tablet (40 mg total) by mouth at bedtime. 04/18/17   Nona Dell, PA-C  pantoprazole (PROTONIX) 40 MG tablet Take 1 tablet (40 mg total) by mouth daily. 06/10/17   Varney Biles, MD  sucralfate (CARAFATE) 1 GM/10ML suspension Take 10 mLs (1 g total) by mouth 4 (four) times daily -  with meals and at bedtime. 05/26/17   Carmin Muskrat, MD    Family History Family History  Problem Relation Age of Onset  . Colon cancer Neg Hx   . Esophageal cancer Neg Hx   . Pancreatic cancer Neg Hx   . Prostate cancer Neg Hx   . Rectal cancer Neg Hx   . Stomach cancer Neg Hx     Social History Social History  Substance Use Topics  . Smoking status: Former Smoker    Packs/day: 0.25    Years: 40.00    Quit date: 12/17/2011  . Smokeless tobacco: Never Used  . Alcohol use No     Allergies   No known allergies   Review of Systems Review  of Systems  Constitutional: Positive for activity change.  Cardiovascular: Negative for chest pain.  Gastrointestinal: Positive for abdominal pain and nausea.  Allergic/Immunologic: Negative for immunocompromised state.     Physical Exam Updated Vital Signs BP 135/84   Pulse (!) 56   Temp 97.7 F (36.5 C) (Oral)   Resp 18   Ht 5\' 4"  (1.626 m)   Wt 74.4 kg (164 lb)   SpO2 96%   BMI 28.15 kg/m   Physical Exam  Constitutional: He is oriented to person, place, and time. He appears well-developed.  HENT:  Head: Normocephalic and atraumatic.  Eyes: Conjunctivae and EOM are normal. Pupils are equal, round, and reactive to light.  Neck: Normal range of motion. Neck supple.  Cardiovascular: Normal rate, regular rhythm and normal heart sounds.     Pulmonary/Chest: Effort normal and breath sounds normal. No respiratory distress. He has no wheezes.  Abdominal: Soft. Bowel sounds are normal. He exhibits no distension. There is no tenderness. There is no rebound and no guarding.  Neurological: He is alert and oriented to person, place, and time.  Skin: Skin is warm.  Nursing note and vitals reviewed.    ED Treatments / Results  Labs (all labs ordered are listed, but only abnormal results are displayed) Labs Reviewed - No data to display  EKG  EKG Interpretation None       Radiology No results found.  Procedures Procedures (including critical care time)  Medications Ordered in ED Medications  gi cocktail (Maalox,Lidocaine,Donnatal) (30 mLs Oral Given 06/10/17 2227)     Initial Impression / Assessment and Plan / ED Course  I have reviewed the triage vital signs and the nursing notes.  Pertinent labs & imaging results that were available during my care of the patient were reviewed by me and considered in my medical decision making (see chart for details).    Pt comes in with worsening of his abd pain. It seems that he has PUD/Gastritis based on his last egd. For some reason he is not taking any meds. We will start PPI and advised PCP/ GI f/u for optimal management. Currently, we dont thing there was perf/sbo/pancreatitis/esophagitis.   Final Clinical Impressions(s) / ED Diagnoses   Final diagnoses:  PUD (peptic ulcer disease)  Chronic gastritis without bleeding, unspecified gastritis type    New Prescriptions Discharge Medication List as of 06/10/2017 10:56 PM    START taking these medications   Details  pantoprazole (PROTONIX) 40 MG tablet Take 1 tablet (40 mg total) by mouth daily., Starting Thu 06/10/2017, Print         Varney Biles, MD 06/13/17 [phone]

## 2017-06-10 NOTE — Discharge Instructions (Signed)
Please take the medicine prescribed. See the GI doctor as requested.

## 2017-06-16 ENCOUNTER — Encounter (HOSPITAL_COMMUNITY): Payer: Self-pay | Admitting: Emergency Medicine

## 2017-06-16 ENCOUNTER — Emergency Department (HOSPITAL_COMMUNITY)
Admission: EM | Admit: 2017-06-16 | Discharge: 2017-06-16 | Disposition: A | Discharge status: Home / Self Care | Payer: Medicaid Other | Attending: Emergency Medicine | Admitting: Emergency Medicine

## 2017-06-16 DIAGNOSIS — E119 Type 2 diabetes mellitus without complications: Secondary | ICD-10-CM | POA: Diagnosis not present

## 2017-06-16 DIAGNOSIS — R1084 Generalized abdominal pain: Secondary | ICD-10-CM | POA: Diagnosis present

## 2017-06-16 DIAGNOSIS — R1013 Epigastric pain: Secondary | ICD-10-CM | POA: Insufficient documentation

## 2017-06-16 DIAGNOSIS — Z79899 Other long term (current) drug therapy: Secondary | ICD-10-CM | POA: Insufficient documentation

## 2017-06-16 DIAGNOSIS — I10 Essential (primary) hypertension: Secondary | ICD-10-CM | POA: Insufficient documentation

## 2017-06-16 DIAGNOSIS — Z87891 Personal history of nicotine dependence: Secondary | ICD-10-CM | POA: Diagnosis not present

## 2017-06-16 LAB — CBC
HCT: 40.1 % (ref 39.0–52.0)
Hemoglobin: 14.1 g/dL (ref 13.0–17.0)
MCH: 29.9 pg (ref 26.0–34.0)
MCHC: 35.2 g/dL (ref 30.0–36.0)
MCV: 85 fL (ref 78.0–100.0)
PLATELETS: 171 10*3/uL (ref 150–400)
RBC: 4.72 MIL/uL (ref 4.22–5.81)
RDW: 12.4 % (ref 11.5–15.5)
WBC: 5.5 10*3/uL (ref 4.0–10.5)

## 2017-06-16 LAB — URINALYSIS, ROUTINE W REFLEX MICROSCOPIC
Bilirubin Urine: NEGATIVE
Glucose, UA: NEGATIVE mg/dL
Hgb urine dipstick: NEGATIVE
KETONES UR: NEGATIVE mg/dL
LEUKOCYTES UA: NEGATIVE
Nitrite: NEGATIVE
PROTEIN: NEGATIVE mg/dL
Specific Gravity, Urine: 1.004 — ABNORMAL LOW (ref 1.005–1.030)
pH: 7 (ref 5.0–8.0)

## 2017-06-16 LAB — COMPREHENSIVE METABOLIC PANEL
ALT: 23 U/L (ref 17–63)
ANION GAP: 8 (ref 5–15)
AST: 30 U/L (ref 15–41)
Albumin: 4.2 g/dL (ref 3.5–5.0)
Alkaline Phosphatase: 106 U/L (ref 38–126)
BILIRUBIN TOTAL: 1.6 mg/dL — AB (ref 0.3–1.2)
BUN: 7 mg/dL (ref 6–20)
CALCIUM: 9 mg/dL (ref 8.9–10.3)
CO2: 24 mmol/L (ref 22–32)
Chloride: 107 mmol/L (ref 101–111)
Creatinine, Ser: 0.87 mg/dL (ref 0.61–1.24)
GFR calc Af Amer: >60 mL/min (ref >60)
GFR calc non Af Amer: >60 mL/min (ref >60)
Glucose, Bld: 108 mg/dL — ABNORMAL HIGH (ref 65–99)
POTASSIUM: 3.6 mmol/L (ref 3.5–5.1)
Sodium: 139 mmol/L (ref 135–145)
TOTAL PROTEIN: 6.8 g/dL (ref 6.5–8.1)

## 2017-06-16 LAB — LIPASE, BLOOD: Lipase: 24 U/L (ref 11–51)

## 2017-06-16 MED ORDER — PANTOPRAZOLE SODIUM 40 MG PO TBEC: 40.0000 mg | DELAYED_RELEASE_TABLET | Freq: Two times a day (BID) | ORAL | 0 refills | Status: DC

## 2017-06-16 MED ORDER — GI COCKTAIL ~~LOC~~
30.0000 mL | Freq: Once | ORAL | Status: AC
  Administered 2017-06-16: 30 mL via ORAL
  Filled 2017-06-16: qty 30

## 2017-06-16 NOTE — ED Provider Notes (Signed)
[facility] DEPT Provider Note   CSN: [id number] Arrival date & time: 06/16/17  0002  By signing my name below, I, Ny'Kea Lewis, attest that this documentation has been prepared under the direction and in the presence of Larita Deremer, Barbette Hair, MD. Electronically Signed: Lise Auer, ED Scribe. 06/16/17. 4:23 AM.  History   Chief Complaint Chief Complaint  Patient presents with  . Abdominal Pain   The history is provided by the patient and a relative. No language interpreter was used.   HPI Comments: Jorge [name] is a 66 y.o. male with a history of DM and HTN who presents to the Emergency Department complaining of persistent acute on chronic right upper quadrant abdominal pain that worsened yesterday evening. Pt's son reports the pt has had this pain for a period of time but it worsened yesterday evening. His pain is worsened after eating. Pain is alleviated with Tramadol. Son states he has been seen multiple times for the same issue with no definitve diagnosis. He is scheduled for an endoscopy on 6/26. Severity of his pain at this time is 5/10. Denies nausea, emeis, fever, hematuria, or dysuria.   Past Medical History:  Diagnosis Date  . Acid reflux   . Arthritis    right leg  . Cataract    bil removed  . Diabetes mellitus without complication ([place])   . Hypertension    Patient Active Problem List   Diagnosis Date Noted  . Abdominal pain, epigastric 05/09/2017  . Lumbosacral spondylosis with radiculopathy 12/25/2016   Past Surgical History:  Procedure Laterality Date  . cataracts Bilateral   . LUMBAR LAMINECTOMY/DECOMPRESSION MICRODISCECTOMY Right 12/25/2016   Procedure: Lumbar three-five Laminectomy, Right Lumbar three-four, Lumbar four-five Diskectomy ;  Surgeon: Kevan Ny Ditty, MD;  Location: [facility];  Service: Neurosurgery;  Laterality: Right;    Home Medications    Prior to Admission medications   Medication Sig Start Date End Date Taking? Authorizing Provider    amLODipine (NORVASC) 5 MG tablet Take 5 mg by mouth daily. 07/16/16  Yes [provider]  atorvastatin (LIPITOR) 40 MG tablet Take 40 mg by mouth daily.   Yes [provider]  dexlansoprazole (DEXILANT) 60 MG capsule Take 60 mg by mouth daily.   Yes [provider]  diazepam (VALIUM) 10 MG tablet Take 1 tablet (10 mg total) by mouth every 8 (eight) hours as needed (muscle spasm). 12/26/16  Yes Pool, Mallie Mussel, MD  dicyclomine (BENTYL) 20 MG tablet Take 1 tablet (20 mg total) by mouth 2 (two) times daily. 05/01/17  Yes Law, Bea Graff, PA-C  famotidine (PEPCID) 40 MG tablet Take 1 tablet (40 mg total) by mouth at bedtime. 04/18/17  Yes Nona Dell, PA-C  sucralfate (CARAFATE) 1 GM/10ML suspension Take 10 mLs (1 g total) by mouth 4 (four) times daily -  with meals and at bedtime. 05/26/17  Yes Carmin Muskrat, MD  traMADol (ULTRAM) 50 MG tablet Take 1 tablet (50 mg total) by mouth every 6 (six) hours as needed. 05/15/17  Yes Daleen Bo, MD  pantoprazole (PROTONIX) 40 MG tablet Take 1 tablet (40 mg total) by mouth 2 (two) times daily. 06/16/17   Jerusalem Wert, Barbette Hair, MD   Family History Family History  Problem Relation Age of Onset  . Colon cancer Neg Hx   . Esophageal cancer Neg Hx   . Pancreatic cancer Neg Hx   . Prostate cancer Neg Hx   . Rectal cancer Neg Hx   . Stomach cancer Neg Hx  Social History Social History  Substance Use Topics  . Smoking status: Former Smoker    Packs/day: 0.25    Years: 40.00    Quit date: 12/17/2011  . Smokeless tobacco: Never Used  . Alcohol use No    Allergies   No known allergies  Review of Systems Review of Systems  Constitutional: Negative for fever.  Respiratory: Negative for shortness of breath.   Cardiovascular: Negative for chest pain.  Gastrointestinal: Positive for abdominal pain. Negative for diarrhea, nausea and vomiting.  Genitourinary: Negative for dysuria and hematuria.  All other systems  reviewed and are negative.  Physical Exam Updated Vital Signs BP 121/78   Pulse (!) 48   Temp 97.7 F (36.5 C) (Oral)   Resp (!) 22   Ht 5\' 4"  (1.626 m)   Wt 66.2 kg (146 lb)   SpO2 95%   BMI 25.06 kg/m   Physical Exam  Constitutional: He is oriented to person, place, and time. No distress.  Elderly, no acute distress  HENT:  Head: Normocephalic and atraumatic.  Cardiovascular: Normal rate, regular rhythm and normal heart sounds.   No murmur heard. Pulmonary/Chest: Effort normal and breath sounds normal. No respiratory distress. He has no wheezes.  Abdominal: Soft. Bowel sounds are normal. There is tenderness. There is no rebound.  Epigastric tenderness to palpation without rebound or guarding  Musculoskeletal: He exhibits no edema.  Neurological: He is alert and oriented to person, place, and time.  Skin: Skin is warm and dry.  Psychiatric: He has a normal mood and affect.  Nursing note and vitals reviewed.   ED Treatments / Results  DIAGNOSTIC STUDIES: Oxygen Saturation is 96% on RA, adqueate by my interpretation.   COORDINATION OF CARE: 4:14 AM-Discussed next steps with pt. Pt verbalized understanding and is agreeable with the plan.   Labs (all labs ordered are listed, but only abnormal results are displayed) Labs Reviewed  COMPREHENSIVE METABOLIC PANEL - Abnormal; Notable for the following:       Result Value   Glucose, Bld 108 (*)    Total Bilirubin 1.6 (*)    All other components within normal limits  URINALYSIS, ROUTINE W REFLEX MICROSCOPIC - Abnormal; Notable for the following:    Color, Urine STRAW (*)    Specific Gravity, Urine 1.004 (*)    All other components within normal limits  LIPASE, BLOOD  CBC    EKG  EKG Interpretation None       Radiology No results found.  Procedures Procedures (including critical care time)  Medications Ordered in ED Medications  gi cocktail (Maalox,Lidocaine,Donnatal) (30 mLs Oral Given 06/16/17 0500)      Initial Impression / Assessment and Plan / ED Course  I have reviewed the triage vital signs and the nursing notes.  Pertinent labs & imaging results that were available during my care of the patient were reviewed by me and considered in my medical decision making (see chart for details).     Patient presents with epigastric pain. History of the same and scheduled for endoscopy. He is previously had an ultrasound that was negative for cholelithiasis. He is nontoxic. Vital signs reassuring.  Patient was given a GI cocktail. Lab work is unremarkable including LFTs. Patient much improved after GI cocktail. Patient states that every time he gets a GI cocktail he improves dramatically. Will increase Protonix to twice daily and have patient follow-up with GI as scheduled.  After history, exam, and medical workup I feel the patient has been appropriately  medically screened and is safe for discharge home. Pertinent diagnoses were discussed with the patient. Patient was given return precautions.   Final Clinical Impressions(s) / ED Diagnoses   Final diagnoses:  Epigastric pain    New Prescriptions Current Discharge Medication List     I personally performed the services described in this documentation, which was scribed in my presence. The recorded information has been reviewed and is accurate.     Merryl Hacker, MD 06/16/17 [phone]

## 2017-06-16 NOTE — ED Triage Notes (Signed)
Patient from home with daughter, patient having abdominal pain, chronic in nature, daughter states that he has had the pain for two to three months.

## 2017-06-16 NOTE — Discharge Instructions (Signed)
You were seen today for upper abdominal pain. You likely have gastritis versus ulcer. Increase pantoprazole to twice daily. Follow-up with gastroenterology as planned for endoscopy. If you have any new or worsening symptoms you should be reevaluated immediately.

## 2017-06-22 ENCOUNTER — Encounter: Payer: Self-pay | Admitting: Gastroenterology

## 2017-06-22 ENCOUNTER — Ambulatory Visit (AMBULATORY_SURGERY_CENTER): Payer: Medicaid Other | Admitting: Gastroenterology

## 2017-06-22 VITALS — BP 108/63 | HR 58 | Temp 98.9°F | Resp 15 | Ht 64.0 in | Wt 164.0 lb

## 2017-06-22 DIAGNOSIS — D123 Benign neoplasm of transverse colon: Secondary | ICD-10-CM

## 2017-06-22 DIAGNOSIS — Z1212 Encounter for screening for malignant neoplasm of rectum: Secondary | ICD-10-CM

## 2017-06-22 DIAGNOSIS — K635 Polyp of colon: Secondary | ICD-10-CM

## 2017-06-22 DIAGNOSIS — Z1211 Encounter for screening for malignant neoplasm of colon: Secondary | ICD-10-CM | POA: Diagnosis not present

## 2017-06-22 MED ORDER — SODIUM CHLORIDE 0.9 % IV SOLN: 500.0000 mL | INTRAVENOUS | Status: DC

## 2017-06-22 NOTE — Patient Instructions (Signed)
   INFORMATION ON POLYPS AND HEMORRHOIDS GIVEN TO YOU TODAY  YOU HAD AN ENDOSCOPIC PROCEDURE TODAY AT [facility]:   Refer to the procedure report that was given to you for any specific questions about what was found during the examination.  If the procedure report does not answer your questions, please call your gastroenterologist to clarify.  If you requested that your care partner not be given the details of your procedure findings, then the procedure report has been included in a sealed envelope for you to review at your convenience later.  YOU SHOULD EXPECT: Some feelings of bloating in the abdomen. Passage of more gas than usual.  Walking can help get rid of the air that was put into your GI tract during the procedure and reduce the bloating. If you had a lower endoscopy (such as a colonoscopy or flexible sigmoidoscopy) you may notice spotting of blood in your stool or on the toilet paper. If you underwent a bowel prep for your procedure, you may not have a normal bowel movement for a few days.  Please Note:  You might notice some irritation and congestion in your nose or some drainage.  This is from the oxygen used during your procedure.  There is no need for concern and it should clear up in a day or so.  SYMPTOMS TO REPORT IMMEDIATELY:   Following lower endoscopy (colonoscopy or flexible sigmoidoscopy):  Excessive amounts of blood in the stool  Significant tenderness or worsening of abdominal pains  Swelling of the abdomen that is new, acute  Fever of 100F or higher    For urgent or emergent issues, a gastroenterologist can be reached at any hour by calling [phone].   DIET:  We do recommend a small meal at first, but then you may proceed to your regular diet.  Drink plenty of fluids but you should avoid alcoholic beverages for 24 hours.  ACTIVITY:  You should plan to take it easy for the rest of today and you should NOT DRIVE or use heavy machinery until  tomorrow (because of the sedation medicines used during the test).    FOLLOW UP: Our staff will call the number listed on your records the next business day following your procedure to check on you and address any questions or concerns that you may have regarding the information given to you following your procedure. If we do not reach you, we will leave a message.  However, if you are feeling well and you are not experiencing any problems, there is no need to return our call.  We will assume that you have returned to your regular daily activities without incident.  If any biopsies were taken you will be contacted by phone or by letter within the next 1-3 weeks.  Please call us at [phone] if you have not heard about the biopsies in 3 weeks.    SIGNATURES/CONFIDENTIALITY: You and/or your care partner have signed paperwork which will be entered into your electronic medical record.  These signatures attest to the fact that that the information above on your After Visit Summary has been reviewed and is understood.  Full responsibility of the confidentiality of this discharge information lies with you and/or your care-partner.

## 2017-06-22 NOTE — Op Note (Signed)
[facility] Patient Name: Jorge [name] Procedure Date: 06/22/2017 1:24 PM MRN: [id number] Endoscopist: Mauri Pole , MD Age: 66 Referring MD:  Date of Birth: 1951/05/31 Gender: Male Account #: [id number] Procedure:                Colonoscopy Indications:              Screening for colorectal malignant neoplasm Medicines:                Monitored Anesthesia Care Procedure:                Pre-Anesthesia Assessment:                           - Prior to the procedure, a History and Physical                            was performed, and patient medications and                            allergies were reviewed. The patient's tolerance of                            previous anesthesia was also reviewed. The risks                            and benefits of the procedure and the sedation                            options and risks were discussed with the patient.                            All questions were answered, and informed consent                            was obtained. Prior Anticoagulants: The patient has                            taken no previous anticoagulant or antiplatelet                            agents. ASA Grade Assessment: II - A patient with                            mild systemic disease. After reviewing the risks                            and benefits, the patient was deemed in                            satisfactory condition to undergo the procedure.                           After obtaining informed consent, the colonoscope  was passed under direct vision. Throughout the                            procedure, the patient's blood pressure, pulse, and                            oxygen saturations were monitored continuously. The                            Colonoscope was introduced through the anus and                            advanced to the the cecum, identified by                            appendiceal orifice and  ileocecal valve. The                            colonoscopy was performed without difficulty. The                            patient tolerated the procedure well. The quality                            of the bowel preparation was excellent. The                            ileocecal valve, appendiceal orifice, and rectum                            were photographed. Scope In: 1:30:00 PM Scope Out: 1:47:07 PM Scope Withdrawal Time: 0 hours 12 minutes 14 seconds  Total Procedure Duration: 0 hours 17 minutes 7 seconds  Findings:                 The perianal and digital rectal examinations were                            normal.                           A 1 mm polyp was found in the transverse colon. The                            polyp was sessile. The polyp was removed with a                            cold biopsy forceps. Resection and retrieval were                            complete.                           A 5 mm polyp was found in the transverse colon. The  polyp was sessile. The polyp was removed with a                            cold snare. Resection and retrieval were complete.                           Non-bleeding internal hemorrhoids were found during                            retroflexion. The hemorrhoids were small.                           The exam was otherwise without abnormality. Complications:            No immediate complications. Estimated Blood Loss:     Estimated blood loss was minimal. Impression:               - One 1 mm polyp in the transverse colon, removed                            with a cold biopsy forceps. Resected and retrieved.                           - One 5 mm polyp in the transverse colon, removed                            with a cold snare. Resected and retrieved.                           - Non-bleeding internal hemorrhoids.                           - The examination was otherwise normal. Recommendation:           -  Patient has a contact number available for                            emergencies. The signs and symptoms of potential                            delayed complications were discussed with the                            patient. Return to normal activities tomorrow.                            Written discharge instructions were provided to the                            patient.                           - Resume previous diet.                           - Continue present medications.                           -  Await pathology results.                           - Repeat colonoscopy in 5-10 years for surveillance                            based on pathology results.                           - Return to GI clinic PRN. Mauri Pole, MD 06/22/2017 1:53:40 PM This report has been signed electronically.

## 2017-06-22 NOTE — Progress Notes (Signed)
Spontaneous respirations throughout. VSS. Resting comfortably. To PACU on room air. Report to  Penny RN.  

## 2017-06-22 NOTE — Progress Notes (Signed)
Pt's states no medical or surgical changes since previsit or office visit. 

## 2017-06-22 NOTE — Progress Notes (Signed)
Called to room to assist during endoscopic procedure.  Patient ID and intended procedure confirmed with present staff. Received instructions for my participation in the procedure from the performing physician.  

## 2017-06-23 ENCOUNTER — Telehealth: Payer: Self-pay

## 2017-06-23 NOTE — Telephone Encounter (Signed)
  Follow up Call-  Call back number 06/22/2017 05/20/2017  Post procedure Call Back phone  # [phone] daughter - Lollie Sails she speaks english [phone]  Permission to leave phone message Yes Yes  Some recent data might be hidden     Patient questions:  Do you have a fever, pain , or abdominal swelling? No. Pain Score  0 *  Have you tolerated food without any problems? Yes.    Have you been able to return to your normal activities? Yes.    Do you have any questions about your discharge instructions: Diet   No. Medications  No. Follow up visit  No.  Do you have questions or concerns about your Care? No.  Actions: * If pain score is 4 or above: No action needed, pain <4.

## 2017-06-28 ENCOUNTER — Encounter: Payer: Self-pay | Admitting: Gastroenterology

## 2017-07-03 ENCOUNTER — Encounter (HOSPITAL_COMMUNITY): Payer: Self-pay | Admitting: Emergency Medicine

## 2017-07-03 ENCOUNTER — Emergency Department (HOSPITAL_COMMUNITY)
Admission: EM | Admit: 2017-07-03 | Discharge: 2017-07-03 | Disposition: A | Discharge status: Home / Self Care | Payer: Medicaid Other | Attending: Emergency Medicine | Admitting: Emergency Medicine

## 2017-07-03 DIAGNOSIS — R1011 Right upper quadrant pain: Secondary | ICD-10-CM | POA: Diagnosis present

## 2017-07-03 DIAGNOSIS — Z87891 Personal history of nicotine dependence: Secondary | ICD-10-CM | POA: Diagnosis not present

## 2017-07-03 DIAGNOSIS — I10 Essential (primary) hypertension: Secondary | ICD-10-CM | POA: Insufficient documentation

## 2017-07-03 DIAGNOSIS — Z79899 Other long term (current) drug therapy: Secondary | ICD-10-CM | POA: Diagnosis not present

## 2017-07-03 DIAGNOSIS — E119 Type 2 diabetes mellitus without complications: Secondary | ICD-10-CM | POA: Insufficient documentation

## 2017-07-03 DIAGNOSIS — R1013 Epigastric pain: Secondary | ICD-10-CM

## 2017-07-03 LAB — COMPREHENSIVE METABOLIC PANEL
ALK PHOS: 112 U/L (ref 38–126)
ALT: 24 U/L (ref 17–63)
AST: 24 U/L (ref 15–41)
Albumin: 4.5 g/dL (ref 3.5–5.0)
Anion gap: 8 (ref 5–15)
BILIRUBIN TOTAL: 1.4 mg/dL — AB (ref 0.3–1.2)
BUN: 11 mg/dL (ref 6–20)
CALCIUM: 9.6 mg/dL (ref 8.9–10.3)
CO2: 25 mmol/L (ref 22–32)
Chloride: 103 mmol/L (ref 101–111)
Creatinine, Ser: 0.82 mg/dL (ref 0.61–1.24)
GFR calc Af Amer: >60 mL/min (ref >60)
GFR calc non Af Amer: >60 mL/min (ref >60)
GLUCOSE: 112 mg/dL — AB (ref 65–99)
Potassium: 3.3 mmol/L — ABNORMAL LOW (ref 3.5–5.1)
SODIUM: 136 mmol/L (ref 135–145)
TOTAL PROTEIN: 7.2 g/dL (ref 6.5–8.1)

## 2017-07-03 LAB — URINALYSIS, ROUTINE W REFLEX MICROSCOPIC
Bilirubin Urine: NEGATIVE
GLUCOSE, UA: NEGATIVE mg/dL
Hgb urine dipstick: NEGATIVE
KETONES UR: NEGATIVE mg/dL
Leukocytes, UA: NEGATIVE
Nitrite: NEGATIVE
PROTEIN: NEGATIVE mg/dL
Specific Gravity, Urine: 1.01 (ref 1.005–1.030)
pH: 7 (ref 5.0–8.0)

## 2017-07-03 LAB — CBC
HEMATOCRIT: 44 % (ref 39.0–52.0)
HEMOGLOBIN: 15.6 g/dL (ref 13.0–17.0)
MCH: 29.9 pg (ref 26.0–34.0)
MCHC: 35.5 g/dL (ref 30.0–36.0)
MCV: 84.5 fL (ref 78.0–100.0)
Platelets: 167 10*3/uL (ref 150–400)
RBC: 5.21 MIL/uL (ref 4.22–5.81)
RDW: 12.4 % (ref 11.5–15.5)
WBC: 8.1 10*3/uL (ref 4.0–10.5)

## 2017-07-03 LAB — LIPASE, BLOOD: Lipase: 21 U/L (ref 11–51)

## 2017-07-03 MED ORDER — GI COCKTAIL ~~LOC~~
30.0000 mL | Freq: Once | ORAL | Status: AC
  Administered 2017-07-03: 30 mL via ORAL
  Filled 2017-07-03: qty 30

## 2017-07-03 MED ORDER — LIDOCAINE VISCOUS 2 % MT SOLN: 15.0000 mL | OROMUCOSAL | 0 refills | Status: DC | PRN

## 2017-07-03 NOTE — ED Triage Notes (Signed)
Pt c/o continues to have burning sensation under right rib that radiates up to throat. Pt seen here for same in past and has followed up with outside doctors. Pt reports medications being prescribed are ineffective and pt has loss of appetite.

## 2017-07-03 NOTE — ED Provider Notes (Signed)
[facility] DEPT Provider Note   CSN: [id number] Arrival date & time: 07/03/17  5009     History   Chief Complaint Chief Complaint  Patient presents with  . Abdominal Pain    HPI INES WARF is a 66 y.o. male who presents to the emergency department with a long-standing history of burning RUQ abdominal pain that radiates along the epigastrium and superiorly to the left side of his throat. He reports associated chronic anorexia and weight loss. He denies fever, chills, N/V/D, hematemesis, dysphagia, dental pain, sore throat, dyspnea, CP, or back pain.  He states he has been evaluated in the ED many times for the symptoms, which will improve while he is here and return at home. He is followed by Dr. Silverio Decamp with GI and was last seen on June 26 for colonoscopy during which 2 benign polyps were removed and internal hemorrhoids were noted. An endoscopy was performed on May 24 and was negative for H. Pylori, dysplasia, metaplasia, and malignancy.   He reports his home diet consists mostly of rice. Home medications include dexlansoprazole 60 mg daily, pantorpazole 40 mg BID, and  famotidine 40 mg QHS.  The history is provided by the patient and a relative. A language interpreter was used.    Past Medical History:  Diagnosis Date  . Acid reflux   . Arthritis    right leg  . Cataract    bil removed  . Diabetes mellitus without complication ([place])   . Hypertension     Patient Active Problem List   Diagnosis Date Noted  . Abdominal pain, epigastric 05/09/2017  . Lumbosacral spondylosis with radiculopathy 12/25/2016    Past Surgical History:  Procedure Laterality Date  . cataracts Bilateral   . LUMBAR LAMINECTOMY/DECOMPRESSION MICRODISCECTOMY Right 12/25/2016   Procedure: Lumbar three-five Laminectomy, Right Lumbar three-four, Lumbar four-five Diskectomy ;  Surgeon: Kevan Ny Ditty, MD;  Location: [facility];  Service: Neurosurgery;  Laterality: Right;       Home Medications      Prior to Admission medications   Medication Sig Start Date End Date Taking? Authorizing Provider  amLODipine (NORVASC) 5 MG tablet Take 5 mg by mouth daily. 07/16/16  Yes [provider]  atorvastatin (LIPITOR) 40 MG tablet Take 40 mg by mouth daily.   Yes [provider]  dexlansoprazole (DEXILANT) 60 MG capsule Take 60 mg by mouth daily.   Yes [provider]  dicyclomine (BENTYL) 20 MG tablet Take 1 tablet (20 mg total) by mouth 2 (two) times daily. 05/01/17  Yes Law, Bea Graff, PA-C  famotidine (PEPCID) 40 MG tablet Take 1 tablet (40 mg total) by mouth at bedtime. 04/18/17  Yes Nona Dell, PA-C  hydrochlorothiazide (MICROZIDE) 12.5 MG capsule Take 12.5 mg by mouth daily.   Yes [provider]  pantoprazole (PROTONIX) 40 MG tablet Take 1 tablet (40 mg total) by mouth 2 (two) times daily. 06/16/17  Yes Horton, Barbette Hair, MD  sucralfate (CARAFATE) 1 GM/10ML suspension Take 10 mLs (1 g total) by mouth 4 (four) times daily -  with meals and at bedtime. Patient taking differently: Take 1 g by mouth 2 (two) times daily.  05/26/17  Yes Carmin Muskrat, MD  traMADol (ULTRAM) 50 MG tablet Take 1 tablet (50 mg total) by mouth every 6 (six) hours as needed. Patient taking differently: Take 50 mg by mouth every 6 (six) hours as needed for moderate pain.  05/15/17  Yes Daleen Bo, MD  lidocaine (XYLOCAINE) 2 % solution  Use as directed 15 mLs in the mouth or throat every 3 (three) hours as needed for mouth pain. 07/03/17   Timithy Arons A, PA-C    Family History Family History  Problem Relation Age of Onset  . Colon cancer Neg Hx   . Esophageal cancer Neg Hx   . Pancreatic cancer Neg Hx   . Prostate cancer Neg Hx   . Rectal cancer Neg Hx   . Stomach cancer Neg Hx     Social History Social History  Substance Use Topics  . Smoking status: Former Smoker    Packs/day: 0.25    Years: 40.00    Quit date: 12/17/2011  . Smokeless tobacco: Never Used   . Alcohol use No     Allergies   Patient has no known allergies.   Review of Systems Review of Systems  Constitutional: Negative for activity change, chills and fever.  HENT: Negative for dental problem, sore throat and trouble swallowing.   Respiratory: Negative for cough and shortness of breath.   Cardiovascular: Negative for chest pain.  Gastrointestinal: Positive for abdominal pain. Negative for diarrhea, nausea and vomiting.  Musculoskeletal: Negative for back pain.  Skin: Negative for rash.   Physical Exam Updated Vital Signs BP 125/82   Pulse 62   Temp 97.8 F (36.6 C) (Oral)   Resp 18   Ht 5\' 4"  (1.626 m)   Wt 74.4 kg (164 lb)   SpO2 98%   BMI 28.15 kg/m   Physical Exam  Constitutional: He appears well-developed.  HENT:  Head: Normocephalic.  Right Ear: Tympanic membrane normal.  Left Ear: Tympanic membrane normal.  Nose: Nose normal.  Mouth/Throat: Uvula is midline, oropharynx is clear and moist and mucous membranes are normal. No tonsillar exudate.  Eyes: Conjunctivae are normal.  Neck: Trachea normal and normal range of motion. Neck supple. No tracheal deviation, no edema and no erythema present. No thyromegaly present.  Cardiovascular: Normal rate, regular rhythm, normal heart sounds and intact distal pulses.  Exam reveals no gallop and no friction rub.   No murmur heard. Pulmonary/Chest: Effort normal and breath sounds normal. No stridor. No respiratory distress. He has no wheezes. He has no rales. He exhibits no tenderness.  Abdominal: Soft. Bowel sounds are normal. He exhibits no distension and no mass. There is tenderness. There is no rebound and no guarding.  Mild epigastric TTP. No RLQ, LLQ, and LUQ TTP. Negative Murphy's.   Lymphadenopathy:    He has no cervical adenopathy.  Neurological: He is alert.  Skin: Skin is warm and dry.  Psychiatric: His behavior is normal.  Nursing note and vitals reviewed.  ED Treatments / Results  Labs (all labs  ordered are listed, but only abnormal results are displayed) Labs Reviewed  COMPREHENSIVE METABOLIC PANEL - Abnormal; Notable for the following:       Result Value   Potassium 3.3 (*)    Glucose, Bld 112 (*)    Total Bilirubin 1.4 (*)    All other components within normal limits  URINALYSIS, ROUTINE W REFLEX MICROSCOPIC - Abnormal; Notable for the following:    Color, Urine STRAW (*)    All other components within normal limits  CBC  LIPASE, BLOOD    EKG  EKG Interpretation None       Radiology No results found.  Procedures Procedures (including critical care time)  Medications Ordered in ED Medications  gi cocktail (Maalox,Lidocaine,Donnatal) (30 mLs Oral Given 07/03/17 1125)     Initial Impression /  Assessment and Plan / ED Course  I have reviewed the triage vital signs and the nursing notes.  Pertinent labs & imaging results that were available during my care of the patient were reviewed by me and considered in my medical decision making (see chart for details).     Patient presenting with chronic epigastric pain who has been evaluated in the ED for similar symptoms numerous times. The patient was seen and evaluated by Dr. Leonette Monarch, attending physician. Labs unremarkable. OP colonoscopy and endoscopy performed in the last 1-2 months. No malignancies, metaplasia, or dysplasia noted on procedures. Negative for H. Pylori. Labs today are unremarkable. Given the patient's previous work-up, I do not feel imaging is required at this time. GI cocktail given in the ED with improvement. Reviewed the patient's meds and recommended d/cing the dexlansoprazole since the patient is also taking pantoprazole. Will provide the patient with a prescription lidocaine and encouraged OTC Maalox for symptom control at home and follow-up to GI. Discussed nutrition concerns given the patient's concerns regarding weight loss. Patient's weight was noted to be 185 lbs in Nov. 2017. Stated weight today  was 165 lb. Recommended that the patient should follow up with PCP regarding nutrition counseling and recommended Ensure for more balanced nutrition than the patient's current diet, which currently consists mostly of white rice. NAD. VSS. The patient is stable for d/c at this time.   Final Clinical Impressions(s) / ED Diagnoses   Final diagnoses:  Epigastric pain    New Prescriptions Discharge Medication List as of 07/03/2017 12:48 PM    START taking these medications   Details  lidocaine (XYLOCAINE) 2 % solution Use as directed 15 mLs in the mouth or throat every 3 (three) hours as needed for mouth pain., Starting Sat 07/03/2017, Print         Oliver Heitzenrater A, PA-C 07/05/17 1344    Fatima Blank, MD 07/08/17 0002

## 2017-07-03 NOTE — Discharge Instructions (Signed)
Please follow up with Dr. Silverio Decamp regarding today's emergency department visit. Please discontinue the dexlansoprazole if you are taking the pantoprazole because these medications worked the same way. You can try drinking Ensure to help obtain the right amount of calories and nutrition for the day. This product is available in the grocery stores. You can also try Maalox, which is available over-the-counter to help with your symptoms. Viscous lidocaine can be used every 3 hours as needed to help with her symptoms also. If you develop worsening symptoms or new symptoms, please return to the emergency department for reevaluation. I have attached information on food choices to help with the burning pain.

## 2017-07-06 ENCOUNTER — Telehealth: Payer: Self-pay | Admitting: Gastroenterology

## 2017-07-06 NOTE — Telephone Encounter (Signed)
Patient has continued to have epigastric pain or "stomach pain." He has been back to the ER. The ER provider told him to stop Dexilant. He had told the ER provider that he was taking Dexilant, Protonix and Pepcid. Daughter in law will come with the patient to the appointment and bring his current medications with him.

## 2017-07-12 ENCOUNTER — Ambulatory Visit (INDEPENDENT_AMBULATORY_CARE_PROVIDER_SITE_OTHER): Payer: Medicaid Other | Admitting: Nurse Practitioner

## 2017-07-12 ENCOUNTER — Encounter: Payer: Self-pay | Admitting: Nurse Practitioner

## 2017-07-12 VITALS — BP 102/72 | HR 56 | Ht 62.5 in | Wt 160.2 lb

## 2017-07-12 DIAGNOSIS — R1011 Right upper quadrant pain: Secondary | ICD-10-CM | POA: Diagnosis not present

## 2017-07-12 NOTE — Progress Notes (Signed)
     HPI: Patient is a 66 -year-old non-English speaking male from  [place] here with daughter for evaluation of RUQ pain. He is known to Dr. Silverio Decamp. He has chronic upper abdominal / RUQ pain evaluated numerous, numerous times by emergency department. Pain radiates through to his back, it has nothing to do with eating and occurs most often in the morning upon rising and when going to bed.  The pain is burning in nature and started following spinal surgery last winter. Burning episodes are transient, occurred a few times a day. He denies burning in chest / throat. No nausea. He has been treated with various PPIs H2 blockers as well as Carafate. Labs have been unrevealing. He has had two CTscans with no acute findings. EGD revealed only mild gastritis, bx negative for H. Pylori. He doesn't take NSAIDs. Weight is stable.    Past Medical History:  Diagnosis Date  . Acid reflux   . Arthritis    right leg  . Cataract    bil removed  . Diabetes mellitus without complication ([place])   . Hypertension     Patient's surgical history, family medical history, social history, medications and allergies were all reviewed in Epic    Physical Exam: BP 102/72   Pulse (!) 56   Ht 5' 2.5" (1.588 m)   Wt 160 lb 3.2 oz (72.7 kg)   BMI 28.83 kg/m   GENERAL: well developed Asian male in NAD PSYCH: :Pleasant, cooperative, normal affect EENT:  conjunctiva pink, mucous membranes moist, neck supple without masses CARDIAC:  RRR, no murmur heard, no peripheral edema PULM: Normal respiratory effort, lungs CTA bilaterally, no wheezing ABDOMEN:  soft, nontender, nondistended, no obvious masses, no hepatomegaly,  normal bowel sounds SKIN:  turgor, no lesions seen Musculoskeletal:  Normal muscle tone, normal strength NEURO: Alert and oriented x 3, no focal neurologic deficits  ASSESSMENT and PLAN:  1. Pleasant non-English speaking Asian male here with daughter for evaluation of RUQ pain. He has chronic RUQ  burning pain evaluated numerous times in ED also by [place] in May . CTscan, U/S x2, EGD, labs unrevealing. Pain apparently started after spinal surgery.  Per Daughter, patient's pain has been getting better since she called last week. Currently on BID PPI after meals and Carafate QID.  -Out of Pepcid for several days. I don't think he needs to resume it at this point. Pain doesn't seem GI in nature as it is unrelated to eating, it recurs despite PPI, H2 blockers, and Carafate. Neuropathic pain?   Wonder if trial of Nortriptyline worth while? Will defer to PCP. Will forward my note to him -continue BID PPI for now but changed timing to before meals.   2. Chronic mild hyperbilirubinemia, 1.7 at peak. Next time labs drawn the bilirubin could be fractionated but no evidence for obstructive process.   3. Hx of adenomatous colon polyps 2018. For 5 year recall.   Tye Savoy , NP 07/12/2017, 3:59 PM   Cc: Nolene Ebbs, MD

## 2017-07-12 NOTE — Patient Instructions (Addendum)
If you are age 66 or older, your body mass index should be between 23-30. Your Body mass index is 28.83 kg/m. If this is out of the aforementioned range listed, please consider follow up with your Primary Care Provider.  If you are age 59 or younger, your body mass index should be between 19-25. Your Body mass index is 28.83 kg/m. If this is out of the aformentioned range listed, please consider follow up with your Primary Care Provider.   Change Protonix to twice daily before meals.  Call in 7-10 days if not better.  Thank you for choosing me and [facility].   Tye Savoy, NP

## 2017-07-13 NOTE — Progress Notes (Signed)
Reviewed and agree with documentation and assessment and plan. K. Veena Neyla Gauntt , MD   

## 2017-08-15 ENCOUNTER — Emergency Department (HOSPITAL_COMMUNITY)
Admission: EM | Admit: 2017-08-15 | Discharge: 2017-08-15 | Disposition: A | Discharge status: Home / Self Care | Payer: Medicaid Other | Attending: Emergency Medicine | Admitting: Emergency Medicine

## 2017-08-15 ENCOUNTER — Encounter (HOSPITAL_COMMUNITY): Payer: Self-pay | Admitting: Emergency Medicine

## 2017-08-15 ENCOUNTER — Emergency Department (HOSPITAL_COMMUNITY): Payer: Medicaid Other

## 2017-08-15 DIAGNOSIS — Z87891 Personal history of nicotine dependence: Secondary | ICD-10-CM | POA: Insufficient documentation

## 2017-08-15 DIAGNOSIS — R1031 Right lower quadrant pain: Secondary | ICD-10-CM

## 2017-08-15 DIAGNOSIS — I10 Essential (primary) hypertension: Secondary | ICD-10-CM | POA: Diagnosis not present

## 2017-08-15 DIAGNOSIS — E119 Type 2 diabetes mellitus without complications: Secondary | ICD-10-CM | POA: Diagnosis not present

## 2017-08-15 DIAGNOSIS — Z79899 Other long term (current) drug therapy: Secondary | ICD-10-CM | POA: Insufficient documentation

## 2017-08-15 LAB — COMPREHENSIVE METABOLIC PANEL
ALT: 22 U/L (ref 17–63)
AST: 24 U/L (ref 15–41)
Albumin: 4.5 g/dL (ref 3.5–5.0)
Alkaline Phosphatase: 98 U/L (ref 38–126)
Anion gap: 7 (ref 5–15)
BUN: 9 mg/dL (ref 6–20)
CHLORIDE: 100 mmol/L — AB (ref 101–111)
CO2: 25 mmol/L (ref 22–32)
Calcium: 9.2 mg/dL (ref 8.9–10.3)
Creatinine, Ser: 0.86 mg/dL (ref 0.61–1.24)
GFR calc Af Amer: >60 mL/min (ref >60)
Glucose, Bld: 107 mg/dL — ABNORMAL HIGH (ref 65–99)
POTASSIUM: 3.5 mmol/L (ref 3.5–5.1)
SODIUM: 132 mmol/L — AB (ref 135–145)
Total Bilirubin: 1.8 mg/dL — ABNORMAL HIGH (ref 0.3–1.2)
Total Protein: 7.6 g/dL (ref 6.5–8.1)

## 2017-08-15 LAB — URINALYSIS, ROUTINE W REFLEX MICROSCOPIC
Bilirubin Urine: NEGATIVE
GLUCOSE, UA: NEGATIVE mg/dL
Hgb urine dipstick: NEGATIVE
Ketones, ur: NEGATIVE mg/dL
LEUKOCYTES UA: NEGATIVE
Nitrite: NEGATIVE
PH: 6 (ref 5.0–8.0)
Protein, ur: NEGATIVE mg/dL
Specific Gravity, Urine: 1.01 (ref 1.005–1.030)

## 2017-08-15 LAB — LIPASE, BLOOD: LIPASE: 22 U/L (ref 11–51)

## 2017-08-15 LAB — CBC
HEMATOCRIT: 45.6 % (ref 39.0–52.0)
Hemoglobin: 16.8 g/dL (ref 13.0–17.0)
MCH: 30.8 pg (ref 26.0–34.0)
MCHC: 36.8 g/dL — ABNORMAL HIGH (ref 30.0–36.0)
MCV: 83.5 fL (ref 78.0–100.0)
Platelets: 190 10*3/uL (ref 150–400)
RBC: 5.46 MIL/uL (ref 4.22–5.81)
RDW: 12.1 % (ref 11.5–15.5)
WBC: 6.6 10*3/uL (ref 4.0–10.5)

## 2017-08-15 MED ORDER — MORPHINE SULFATE (PF) 4 MG/ML IV SOLN
2.0000 mg | Freq: Once | INTRAVENOUS | Status: AC
  Administered 2017-08-15: 2 mg via INTRAVENOUS

## 2017-08-15 MED ORDER — MORPHINE SULFATE (PF) 4 MG/ML IV SOLN: 4.0000 mg | Freq: Once | INTRAVENOUS | Status: DC

## 2017-08-15 MED ORDER — SODIUM CHLORIDE 0.9 % IV BOLUS (SEPSIS)
1000.0000 mL | Freq: Once | INTRAVENOUS | Status: AC
  Administered 2017-08-15: 1000 mL via INTRAVENOUS

## 2017-08-15 MED ORDER — ONDANSETRON HCL 4 MG/2ML IJ SOLN
4.0000 mg | Freq: Once | INTRAMUSCULAR | Status: AC
  Administered 2017-08-15: 4 mg via INTRAVENOUS
  Filled 2017-08-15: qty 2

## 2017-08-15 MED ORDER — GI COCKTAIL ~~LOC~~
30.0000 mL | Freq: Once | ORAL | Status: AC
  Administered 2017-08-15: 30 mL via ORAL
  Filled 2017-08-15: qty 30

## 2017-08-15 MED ORDER — FAMOTIDINE IN NACL 20-0.9 MG/50ML-% IV SOLN
20.0000 mg | Freq: Once | INTRAVENOUS | Status: AC
  Administered 2017-08-15: 20 mg via INTRAVENOUS
  Filled 2017-08-15: qty 50

## 2017-08-15 MED ORDER — IOPAMIDOL (ISOVUE-300) INJECTION 61%
INTRAVENOUS | Status: AC
  Administered 2017-08-15: 100 mL
  Filled 2017-08-15: qty 100

## 2017-08-15 NOTE — ED Notes (Signed)
Patient transported to CT 

## 2017-08-15 NOTE — ED Notes (Signed)
Family at bedside. 

## 2017-08-15 NOTE — ED Provider Notes (Signed)
[facility] DEPT Provider Note   CSN: [id number] Arrival date & time: 08/15/17  6283     History   Chief Complaint Chief Complaint  Patient presents with  . Abdominal Pain    HPI Jorge [name] is a 66 y.o. male.  HPI 66 year old male past medical history significant for GERD, diabetes, hypertension that presents to the ED with son with complaints of right lower quadrant abdominal pain. The patient does not speak [place]. Son is interpreting at bedside and is agreeable to interpreting. Patient has been complaining of right lower quadrant abdominal pain for the past 2 days. Has progressively worsened. Nothing makes better or worse. Patient has not tried nothing for his symptoms prior to arrival.  Denies any other associated symptoms including fever, chills, nausea, vomiting, diarrhea. Patient does report constipation at baseline. Takes MiraLAX as needed. Last bowel movement was yesterday. No melena or hematochezia.  Pt denies any fever, chill, ha, vision changes, lightheadedness, dizziness, congestion, neck pain, cp, sob, cough, n/v/d, urinary symptoms, change in bowel habits, melena, hematochezia, lower extremity paresthesias.  He states he has been evaluated in the ED many times for the epigastric symptoms, which will improve while he is here and return at home. He is followed by Dr. Silverio Decamp with GI and was last seen on June 26 for colonoscopy during which 2 benign polyps were removed and internal hemorrhoids were noted. An endoscopy was performed on May 24 and was negative for H. Pylori, dysplasia, metaplasia, and malignancy.   He reports his home diet consists mostly of rice. Home medications include dexlansoprazole 60 mg daily, pantorpazole 40 mg BID, and  famotidine 40 mg QHS. Past Medical History:  Diagnosis Date  . Acid reflux   . Arthritis    right leg  . Cataract    bil removed  . Diabetes mellitus without complication ([place])   . Hypertension     Patient Active Problem  List   Diagnosis Date Noted  . Abdominal pain, epigastric 05/09/2017  . Lumbosacral spondylosis with radiculopathy 12/25/2016    Past Surgical History:  Procedure Laterality Date  . cataracts Bilateral   . LUMBAR LAMINECTOMY/DECOMPRESSION MICRODISCECTOMY Right 12/25/2016   Procedure: Lumbar three-five Laminectomy, Right Lumbar three-four, Lumbar four-five Diskectomy ;  Surgeon: Kevan Ny Ditty, MD;  Location: [facility];  Service: Neurosurgery;  Laterality: Right;       Home Medications    Prior to Admission medications   Medication Sig Start Date End Date Taking? Authorizing Provider  amLODipine (NORVASC) 5 MG tablet Take 5 mg by mouth daily. 07/16/16   [provider]  atorvastatin (LIPITOR) 40 MG tablet Take 40 mg by mouth daily.    [provider]  dicyclomine (BENTYL) 20 MG tablet Take 1 tablet (20 mg total) by mouth 2 (two) times daily. 05/01/17   Law, Bea Graff, PA-C  famotidine (PEPCID) 40 MG tablet Take 1 tablet (40 mg total) by mouth at bedtime. 04/18/17   Nona Dell, PA-C  hydrochlorothiazide (MICROZIDE) 12.5 MG capsule Take 12.5 mg by mouth daily.    [provider]  pantoprazole (PROTONIX) 40 MG tablet Take 1 tablet (40 mg total) by mouth 2 (two) times daily. 06/16/17   Horton, Barbette Hair, MD  sucralfate (CARAFATE) 1 GM/10ML suspension Take 10 mLs (1 g total) by mouth 4 (four) times daily -  with meals and at bedtime. Patient taking differently: Take 1 g by mouth 2 (two) times daily.  05/26/17   Carmin Muskrat, MD  Family History Family History  Problem Relation Age of Onset  . Colon cancer Neg Hx   . Esophageal cancer Neg Hx   . Pancreatic cancer Neg Hx   . Prostate cancer Neg Hx   . Rectal cancer Neg Hx   . Stomach cancer Neg Hx     Social History Social History  Substance Use Topics  . Smoking status: Former Smoker    Packs/day: 0.25    Years: 40.00    Quit date: 12/17/2011  . Smokeless tobacco: Never Used  .  Alcohol use No     Allergies   Patient has no known allergies.   Review of Systems Review of Systems  Constitutional: Negative for chills and fever.  HENT: Negative for congestion and sore throat.   Eyes: Negative for visual disturbance.  Respiratory: Negative for cough and shortness of breath.   Cardiovascular: Negative for chest pain.  Gastrointestinal: Positive for abdominal pain. Negative for diarrhea, nausea and vomiting.  Genitourinary: Negative for dysuria, flank pain, frequency, hematuria, scrotal swelling, testicular pain and urgency.  Musculoskeletal: Negative for arthralgias and myalgias.  Skin: Negative for rash.  Neurological: Negative for dizziness, syncope, weakness, light-headedness, numbness and headaches.  Psychiatric/Behavioral: Negative for sleep disturbance. The patient is not nervous/anxious.      Physical Exam Updated Vital Signs BP (!) 146/82   Pulse 63   Temp (!) 97.4 F (36.3 C) (Oral)   Resp 18   Ht 5\' 2"  (1.575 m)   Wt 74.8 kg (165 lb)   SpO2 94%   BMI 30.18 kg/m   Physical Exam  Constitutional: He is oriented to person, place, and time. He appears well-developed and well-nourished.  Non-toxic appearance. No distress.  Appears to be in pain   HENT:  Head: Normocephalic and atraumatic.  Mouth/Throat: Oropharynx is clear and moist.  Eyes: Pupils are equal, round, and reactive to light. Conjunctivae are normal. Right eye exhibits no discharge. Left eye exhibits no discharge.  Neck: Normal range of motion. Neck supple.  Cardiovascular: Normal rate, regular rhythm, normal heart sounds and intact distal pulses.  Exam reveals no gallop and no friction rub.   No murmur heard. Pulmonary/Chest: Effort normal and breath sounds normal. No respiratory distress. He has no wheezes. He has no rales. He exhibits no tenderness.  Abdominal: Soft. Bowel sounds are normal. He exhibits no distension. There is tenderness in the right lower quadrant. There is no  rigidity, no rebound, no guarding, no CVA tenderness, no tenderness at McBurney's point and negative Murphy's sign.  Musculoskeletal: Normal range of motion. He exhibits no tenderness.  Lymphadenopathy:    He has no cervical adenopathy.  Neurological: He is alert and oriented to person, place, and time.  Skin: Skin is warm and dry. Capillary refill takes less than 2 seconds. No rash noted.  Psychiatric: His behavior is normal. Judgment and thought content normal.  Nursing note and vitals reviewed.    ED Treatments / Results  Labs (all labs ordered are listed, but only abnormal results are displayed) Labs Reviewed  COMPREHENSIVE METABOLIC PANEL - Abnormal; Notable for the following:       Result Value   Sodium 132 (*)    Chloride 100 (*)    Glucose, Bld 107 (*)    Total Bilirubin 1.8 (*)    All other components within normal limits  CBC - Abnormal; Notable for the following:    MCHC 36.8 (*)    All other components within normal limits  LIPASE, BLOOD  URINALYSIS, ROUTINE W REFLEX MICROSCOPIC    EKG  EKG Interpretation None       Radiology Ct Abdomen Pelvis W Contrast  Result Date: 08/15/2017 CLINICAL DATA:  66 year old male with a history of right lower quadrant abdominal pain EXAM: CT ABDOMEN AND PELVIS WITH CONTRAST TECHNIQUE: Multidetector CT imaging of the abdomen and pelvis was performed using the standard protocol following bolus administration of intravenous contrast. CONTRAST:  142mL ISOVUE-300 IOPAMIDOL (ISOVUE-300) INJECTION 61% COMPARISON:  03/30/2017, 12/10/2016 FINDINGS: Lower chest: No acute abnormality. Hepatobiliary: Relatively uniform attenuation/enhancement of liver parenchyma. Focal fatty infiltration adjacent to the falciform ligament. Hypodense rounded lesion measures 7 mm of segment 3, likely a benign cyst though incompletely characterized. This is unchanged from the comparison. Unremarkable gallbladder. Pancreas: Unremarkable pancreas Spleen: Unremarkable  spleen Adrenals/Urinary Tract: Unremarkable adrenal glands. Unremarkable right kidney and right ureter. Unremarkable left kidney and left ureter. Partial distention of the urinary bladder, otherwise unremarkable. Stomach/Bowel: Unremarkable stomach. Small bowel without abnormal distention. No transition point. No focal small bowel wall thickening. Normal appendix. Unremarkable appearance of colon without wall thickening. No inflammatory changes within the adjacent fat of small bowel or colon. Vascular/Lymphatic: Minimal calcifications of the iliac vasculature. No aneurysm. No periaortic fluid. Mesenteric vessels appear patent. Reproductive: Unremarkable appearance of prostate. Other: No abdominal wall hernia or abnormality. No abdominopelvic ascites. Musculoskeletal: No displaced fracture. Surgical changes of the lumbar region of prior laminectomy at L4-L5. Bilateral L4 pars defect. No significant degenerative changes of the hips. IMPRESSION: No acute finding to account for the patient's abdominal pain. Aortic Atherosclerosis (ICD10-I70.0). Incidental findings as above. Electronically Signed   By: Corrie Mckusick D.O.   On: 08/15/2017 11:58    Procedures Procedures (including critical care time)  Medications Ordered in ED Medications  famotidine (PEPCID) IVPB 20 mg premix (20 mg Intravenous New Bag/Given 08/15/17 1026)  sodium chloride 0.9 % bolus 1,000 mL (1,000 mLs Intravenous New Bag/Given 08/15/17 1020)  ondansetron (ZOFRAN) injection 4 mg (4 mg Intravenous Given 08/15/17 1020)  gi cocktail (Maalox,Lidocaine,Donnatal) (30 mLs Oral Given 08/15/17 1026)  morphine 4 MG/ML injection 2 mg (2 mg Intravenous Given 08/15/17 1021)     Initial Impression / Assessment and Plan / ED Course  I have reviewed the triage vital signs and the nursing notes.  Pertinent labs & imaging results that were available during my care of the patient were reviewed by me and considered in my medical decision making (see chart for  details).     Patient presents to the ED with complaints of right lower quadrant abdominal pain. On exam patient with mild tender palpation in the right lower quadrant but no signs of peritonitis or guarding. Patient has been seen 13 times in the past 6 months for chronic abdominal pain. Usually given GI cocktail in the ED with improvement in his symptoms. Patient is followed closely with gastroenterologist. Patient is nontoxic, nonseptic appearing, in no apparent distress.  Patient's pain and other symptoms adequately managed in emergency department.  Feels better GI cocktail, Pepcid, pain medication. Able to tolerate by mouth fluids without any emesis. Patient states his pain has improved to a 4 out of 10. Fluid bolus given.  Labs, imaging and vitals reviewed.  Patient does not meet the SIRS or Sepsis criteria.  On repeat exam patient does not have a surgical abdomin and there are no peritoneal signs.  No indication of appendicitis, bowel obstruction, bowel perforation, cholecystitis, diverticulitis.  Patient discharged home with symptomatic treatment and given strict instructions for follow-up  with their primary care physician and gastroenterologist. Encouraged to continue taking his Pepcid, omeprazole, Carafate at home as prescribed.encouraged daily MiraLAX for constipation. I have also discussed reasons to return immediately to the ER.  Patient expresses understanding and agrees with plan.  Patient discussed with Dr. Johnney Killian who saw patient is agreeable the above plan.     Final Clinical Impressions(s) / ED Diagnoses   Final diagnoses:  Right lower quadrant abdominal pain    New Prescriptions New Prescriptions   No medications on file     Aaron Edelman 08/15/17 1220    Charlesetta Shanks, MD 08/24/17 1529

## 2017-08-15 NOTE — ED Triage Notes (Signed)
Pt c/o sharp RLQ abdominal pain, pt is clutching his right side, pt is very tender to palpation on RLQ

## 2017-08-15 NOTE — ED Provider Notes (Signed)
Medical screening examination/treatment/procedure(s) were conducted as a shared visit with non-physician practitioner(s) and myself.  I personally evaluated the patient during the encounter.   EKG Interpretation None     Patient has had ongoing right lower and mid quadrant pain. No associated symptoms. Patient has been treated with GI cocktail prior to my exam. He does feel improved. Abdomen is soft. He does continue to endorse some mild to moderate right lower and lateral quadrant tenderness. No guarding. CT scan and diagnostic workup are negative for acute findings. Patient's vital signs are normal. At this point I feel patient is stable to continue outpatient follow-up with his gastroenterologist. I agree with plan of management.   Charlesetta Shanks, MD 08/15/17 1214

## 2017-08-15 NOTE — Discharge Instructions (Signed)
Your blood work and imaging has been reassuring. I will continue taking her Pepcid, Prilosec, Carafate at home regularly. May take Motrin or Tylenol for pain. Follow-up with her gastroenterologist or primary care doctor. Return to the ED if he develops any blood in her stools, fever, worsening pain, vomiting.  One cap of Miralax mixed in a glass of water 1-2 times daily until bowel movements are regular. An over the counter stool softener such as Colace or Senokat S can also be used.  Follow up with your primary physician in regard's to today's visit. Return to ER for vomiting, new or worsening symptoms, any additional concerns.   GETTING TO GOOD BOWEL HEALTH.     The goal: ONE SOFT BOWEL MOVEMENT A DAY!  To have soft, regular bowel movements:  Drink at least 8 tall glasses of water a day.   Take plenty of fiber.  Fiber is the undigested part of plant food that passes into the colon, acting s ?natures broom? to encourage bowel motility and movement.  Fiber can absorb and hold large amounts of water. This results in a larger, bulkier stool, which is soft and easier to pass. Work gradually over several weeks up to 6 servings a day of fiber (25g a day even more if needed) in the form of: Vegetables -- Root (potatoes, carrots, turnips), leafy green (lettuce, salad greens, celery, spinach), or cooked high residue (cabbage, broccoli, etc) Fruit -- Fresh (unpeeled skin & pulp), Dried (prunes, apricots, cherries, etc ),  or stewed ( applesauce)  Whole grain breads, pasta, etc (whole wheat)  Bran cereals  No reading or other relaxing activity while on the toilet. If bowel movements take longer than 5 minutes, you are too constipated

## 2017-08-16 ENCOUNTER — Telehealth: Payer: Self-pay | Admitting: Gastroenterology

## 2017-08-16 NOTE — Telephone Encounter (Signed)
He has had extensive evaluation and no evidence of significant GI pathology. Pain likely musculoskeletal in nature. Please advise patient to follow up with PMD

## 2017-08-16 NOTE — Telephone Encounter (Signed)
No new symptoms or change of the symptoms. He is taking his medications for his stomach. The daughter will call his PMD and ask for a new appointment.

## 2017-08-16 NOTE — Telephone Encounter (Signed)
Please review the last ER visit. The patient was given GI cocktail and morphine. Isn't he supposed to go to his PCP for re-evaluation.

## 2017-08-18 ENCOUNTER — Encounter (HOSPITAL_COMMUNITY): Payer: Self-pay | Admitting: *Deleted

## 2017-08-18 ENCOUNTER — Emergency Department (HOSPITAL_COMMUNITY)
Admission: EM | Admit: 2017-08-18 | Discharge: 2017-08-18 | Disposition: A | Discharge status: Home / Self Care | Payer: Medicaid Other | Attending: Emergency Medicine | Admitting: Emergency Medicine

## 2017-08-18 ENCOUNTER — Emergency Department (HOSPITAL_COMMUNITY): Payer: Medicaid Other

## 2017-08-18 DIAGNOSIS — R1011 Right upper quadrant pain: Secondary | ICD-10-CM | POA: Insufficient documentation

## 2017-08-18 DIAGNOSIS — Z87891 Personal history of nicotine dependence: Secondary | ICD-10-CM | POA: Diagnosis not present

## 2017-08-18 DIAGNOSIS — R079 Chest pain, unspecified: Secondary | ICD-10-CM | POA: Insufficient documentation

## 2017-08-18 DIAGNOSIS — E119 Type 2 diabetes mellitus without complications: Secondary | ICD-10-CM | POA: Insufficient documentation

## 2017-08-18 DIAGNOSIS — R1010 Upper abdominal pain, unspecified: Secondary | ICD-10-CM | POA: Diagnosis present

## 2017-08-18 DIAGNOSIS — I10 Essential (primary) hypertension: Secondary | ICD-10-CM | POA: Insufficient documentation

## 2017-08-18 LAB — CBC WITH DIFFERENTIAL/PLATELET
BASOS PCT: 0 %
Basophils Absolute: 0 10*3/uL (ref 0.0–0.1)
EOS ABS: 0.1 10*3/uL (ref 0.0–0.7)
EOS PCT: 2 %
HCT: 44.4 % (ref 39.0–52.0)
Hemoglobin: 16.1 g/dL (ref 13.0–17.0)
LYMPHS ABS: 1.5 10*3/uL (ref 0.7–4.0)
Lymphocytes Relative: 28 %
MCH: 30.8 pg (ref 26.0–34.0)
MCHC: 36.3 g/dL — AB (ref 30.0–36.0)
MCV: 84.9 fL (ref 78.0–100.0)
Monocytes Absolute: 0.3 10*3/uL (ref 0.1–1.0)
Monocytes Relative: 6 %
Neutro Abs: 3.5 10*3/uL (ref 1.7–7.7)
Neutrophils Relative %: 64 %
PLATELETS: 170 10*3/uL (ref 150–400)
RBC: 5.23 MIL/uL (ref 4.22–5.81)
RDW: 11.9 % (ref 11.5–15.5)
WBC: 5.5 10*3/uL (ref 4.0–10.5)

## 2017-08-18 LAB — COMPREHENSIVE METABOLIC PANEL
ALBUMIN: 4.3 g/dL (ref 3.5–5.0)
ALT: 20 U/L (ref 17–63)
ANION GAP: 7 (ref 5–15)
AST: 27 U/L (ref 15–41)
Alkaline Phosphatase: 99 U/L (ref 38–126)
BUN: 6 mg/dL (ref 6–20)
CHLORIDE: 107 mmol/L (ref 101–111)
CO2: 26 mmol/L (ref 22–32)
Calcium: 9.3 mg/dL (ref 8.9–10.3)
Creatinine, Ser: 0.82 mg/dL (ref 0.61–1.24)
GFR calc non Af Amer: >60 mL/min (ref >60)
GLUCOSE: 104 mg/dL — AB (ref 65–99)
Potassium: 3.6 mmol/L (ref 3.5–5.1)
SODIUM: 140 mmol/L (ref 135–145)
Total Bilirubin: 1.3 mg/dL — ABNORMAL HIGH (ref 0.3–1.2)
Total Protein: 7.3 g/dL (ref 6.5–8.1)

## 2017-08-18 LAB — LIPASE, BLOOD: Lipase: 25 U/L (ref 11–51)

## 2017-08-18 MED ORDER — HYDROCODONE-ACETAMINOPHEN 5-325 MG PO TABS: 1.0000 | ORAL_TABLET | ORAL | 0 refills | Status: DC | PRN

## 2017-08-18 NOTE — ED Triage Notes (Signed)
Pt states R upper quadrant pain that radiates to R upper back.  Also c/o sternal chest pain.  States it is a chronic problem.  Was seen 3 days ago for same.  States has been taking meds prescribed.  No bm since Sunday.

## 2017-08-18 NOTE — ED Notes (Signed)
Pt transported to US

## 2017-08-18 NOTE — ED Provider Notes (Signed)
[facility] DEPT Provider Note   CSN: [id number] Arrival date & time: 08/18/17  0827     History   Chief Complaint Chief Complaint  Patient presents with  . Abdominal Pain  . Chest Pain    HPI Jorge [name] is a 66 y.o. male.  Patient states that he's been having upper abdominal pain. Patient has had a CT scan of his abdomen. he has also had endoscopy upper and lower and nothing has been found to causes abdominal discomfort   The history is provided by the patient. No language interpreter was used.  Abdominal Pain   This is a recurrent problem. The current episode started more than 2 days ago. The problem occurs constantly. The problem has not changed since onset.The pain is associated with an unknown factor. The pain is located in the RUQ. The quality of the pain is dull. The pain is at a severity of 6/10. The pain is moderate. Associated symptoms include anorexia. Pertinent negatives include diarrhea, frequency and hematuria. Nothing aggravates the symptoms. Nothing relieves the symptoms. Past workup includes GI consult. His past medical history is significant for PUD.    Past Medical History:  Diagnosis Date  . Acid reflux   . Arthritis    right leg  . Cataract    bil removed  . Diabetes mellitus without complication ([place])   . Hypertension     Patient Active Problem List   Diagnosis Date Noted  . Abdominal pain, epigastric 05/09/2017  . Lumbosacral spondylosis with radiculopathy 12/25/2016    Past Surgical History:  Procedure Laterality Date  . cataracts Bilateral   . LUMBAR LAMINECTOMY/DECOMPRESSION MICRODISCECTOMY Right 12/25/2016   Procedure: Lumbar three-five Laminectomy, Right Lumbar three-four, Lumbar four-five Diskectomy ;  Surgeon: Kevan Ny Ditty, MD;  Location: [facility];  Service: Neurosurgery;  Laterality: Right;       Home Medications    Prior to Admission medications   Medication Sig Start Date End Date Taking? Authorizing Provider    amLODipine (NORVASC) 5 MG tablet Take 5 mg by mouth daily. 07/16/16  Yes [provider]  dicyclomine (BENTYL) 20 MG tablet Take 1 tablet (20 mg total) by mouth 2 (two) times daily. 05/01/17  Yes Law, Bea Graff, PA-C  famotidine (PEPCID) 40 MG tablet Take 1 tablet (40 mg total) by mouth at bedtime. 04/18/17  Yes Nona Dell, PA-C  hydrochlorothiazide (HYDRODIURIL) 12.5 MG tablet Take 12.5 mg by mouth daily.   Yes [provider]  pantoprazole (PROTONIX) 40 MG tablet Take 1 tablet (40 mg total) by mouth 2 (two) times daily. 06/16/17  Yes Horton, Barbette Hair, MD  traMADol (ULTRAM) 50 MG tablet Take 50 mg by mouth 2 (two) times daily as needed for pain. 07/30/17  Yes [provider]  HYDROcodone-acetaminophen (NORCO/VICODIN) 5-325 MG tablet Take 1 tablet by mouth every 4 (four) hours as needed for moderate pain. 08/18/17   Milton Ferguson, MD  sucralfate (CARAFATE) 1 GM/10ML suspension Take 10 mLs (1 g total) by mouth 4 (four) times daily -  with meals and at bedtime. Patient not taking: Reported on 08/15/2017 05/26/17   Carmin Muskrat, MD    Family History Family History  Problem Relation Age of Onset  . Colon cancer Neg Hx   . Esophageal cancer Neg Hx   . Pancreatic cancer Neg Hx   . Prostate cancer Neg Hx   . Rectal cancer Neg Hx   . Stomach cancer Neg Hx     Social History Social History  Substance Use Topics  . Smoking status: Former Smoker    Packs/day: 0.25    Years: 40.00    Quit date: 12/17/2011  . Smokeless tobacco: Never Used  . Alcohol use No     Allergies   Patient has no known allergies.   Review of Systems Review of Systems  Constitutional: Negative for appetite change and fatigue.  HENT: Negative for congestion, ear discharge and sinus pressure.   Eyes: Negative for discharge.  Gastrointestinal: Positive for anorexia. Negative for diarrhea.  Genitourinary: Negative for frequency and hematuria.  Skin: Negative for rash.   Psychiatric/Behavioral: Negative for hallucinations.     Physical Exam Updated Vital Signs BP (!) 154/90   Pulse 63   Temp (!) 97.5 F (36.4 C) (Oral)   Resp (!) 25   Ht 5\' 2"  (1.575 m)   Wt 74.8 kg (165 lb)   SpO2 96%   BMI 30.18 kg/m   Physical Exam  Constitutional: He is oriented to person, place, and time. He appears well-developed.  HENT:  Head: Normocephalic.  Eyes: Conjunctivae and EOM are normal. No scleral icterus.  Neck: Neck supple. No thyromegaly present.  Cardiovascular: Normal rate and regular rhythm.  Exam reveals no gallop and no friction rub.   No murmur heard. Pulmonary/Chest: No stridor. He has no wheezes. He has no rales. He exhibits no tenderness.  Abdominal: There is tenderness. There is no rebound.  Musculoskeletal: Normal range of motion. He exhibits no edema.  Lymphadenopathy:    He has no cervical adenopathy.  Neurological: He is oriented to person, place, and time. He exhibits normal muscle tone. Coordination normal.  Skin: No rash noted. No erythema.  Psychiatric: He has a normal mood and affect. His behavior is normal.     ED Treatments / Results  Labs (all labs ordered are listed, but only abnormal results are displayed) Labs Reviewed  CBC WITH DIFFERENTIAL/PLATELET - Abnormal; Notable for the following:       Result Value   MCHC 36.3 (*)    All other components within normal limits  COMPREHENSIVE METABOLIC PANEL - Abnormal; Notable for the following:    Glucose, Bld 104 (*)    Total Bilirubin 1.3 (*)    All other components within normal limits  LIPASE, BLOOD    EKG  EKG Interpretation  Date/Time:  Wednesday August 18 2017 09:08:24 EDT Ventricular Rate:  56 PR Interval:  170 QRS Duration: 92 QT Interval:  414 QTC Calculation: 399 R Axis:   79 Text Interpretation:  Sinus bradycardia Otherwise normal ECG Confirmed by Milton Ferguson [phone]) on 08/18/2017 12:56:38 PM       Radiology US Abdomen Complete  Result Date:  08/18/2017 CLINICAL DATA:  Abdominal pain EXAM: ABDOMEN ULTRASOUND COMPLETE COMPARISON:  CT abdomen and pelvis August 15, 2017 FINDINGS: Gallbladder: No gallstones or wall thickening visualized. There is no pericholecystic fluid. No sonographic Murphy sign noted by sonographer. Common bile duct: Diameter: 3 mm. No intrahepatic, common hepatic, or common bile duct dilatation. Liver: There is a cyst in the medial segment of the left lobe of the liver measuring 0.7 x 0.4 x 0.7 cm. No other focal liver lesion is evident. Liver echogenicity is rather coarse and inhomogeneous. It is overall somewhat increased. Portal vein is patent on color Doppler imaging with normal direction of blood flow towards the liver. IVC: No abnormality visualized in areas that can be interrogated. Most of the infrahepatic inferior vena cava obscured by gas. Pancreas: Essentially completely obscured by gas.  Spleen: Size and appearance within normal limits. Right Kidney: Length: 10.7 cm. Echogenicity within normal limits. No mass or hydronephrosis visualized. Left Kidney: Length: 9.8 cm. Echogenicity within normal limits. No mass or hydronephrosis visualized. Abdominal aorta: Essentially completely obscured by gas. Other findings: No demonstrable ascites. IMPRESSION: 1. Infrahepatic inferior vena cava, pancreas, and aorta obscured by gas. 2. Small cyst in liver. Liver echogenicity is somewhat increased and inhomogeneous and rather coarse. Disappearance likely is due to hepatic steatosis. While no focal liver lesions are evident beyond a small cyst in the left lobe, it should be cautioned that the sensitivity of ultrasound for detection of focal noncystic liver lesions is diminished in this circumstance. 3.  Study otherwise unremarkable. Electronically Signed   By: Lowella Grip III M.D.   On: 08/18/2017 12:34    Procedures Procedures (including critical care time)  Medications Ordered in ED Medications - No data to display   Initial  Impression / Assessment and Plan / ED Course  I have reviewed the triage vital signs and the nursing notes.  Pertinent labs & imaging results that were available during my care of the patient were reviewed by me and considered in my medical decision making (see chart for details).     Labs and abdominal ultrasound unremarkable. Patient will continue taking his current medicine and will be given pain medicine to help with his discomfort. Patient with chronic abdominal pain he will be sent back to his primary care doctor  Final Clinical Impressions(s) / ED Diagnoses   Final diagnoses:  Pain of upper abdomen    New Prescriptions New Prescriptions   HYDROCODONE-ACETAMINOPHEN (NORCO/VICODIN) 5-325 MG TABLET    Take 1 tablet by mouth every 4 (four) hours as needed for moderate pain.     Milton Ferguson, MD 08/18/17 1302

## 2017-08-18 NOTE — Discharge Instructions (Signed)
Follow up with your md in 1-2 weeks. °

## 2017-09-05 ENCOUNTER — Emergency Department (HOSPITAL_COMMUNITY)
Admission: EM | Admit: 2017-09-05 | Discharge: 2017-09-05 | Disposition: A | Discharge status: Home / Self Care | Payer: Medicaid Other | Attending: Physician Assistant | Admitting: Physician Assistant

## 2017-09-05 ENCOUNTER — Encounter (HOSPITAL_COMMUNITY): Payer: Self-pay | Admitting: Emergency Medicine

## 2017-09-05 ENCOUNTER — Emergency Department (HOSPITAL_COMMUNITY): Payer: Medicaid Other

## 2017-09-05 DIAGNOSIS — I10 Essential (primary) hypertension: Secondary | ICD-10-CM | POA: Diagnosis not present

## 2017-09-05 DIAGNOSIS — K2940 Chronic atrophic gastritis without bleeding: Secondary | ICD-10-CM | POA: Insufficient documentation

## 2017-09-05 DIAGNOSIS — Z87891 Personal history of nicotine dependence: Secondary | ICD-10-CM | POA: Insufficient documentation

## 2017-09-05 DIAGNOSIS — E119 Type 2 diabetes mellitus without complications: Secondary | ICD-10-CM | POA: Diagnosis not present

## 2017-09-05 DIAGNOSIS — K2950 Unspecified chronic gastritis without bleeding: Secondary | ICD-10-CM

## 2017-09-05 DIAGNOSIS — R1013 Epigastric pain: Secondary | ICD-10-CM | POA: Diagnosis present

## 2017-09-05 LAB — COMPREHENSIVE METABOLIC PANEL
ALK PHOS: 92 U/L (ref 38–126)
ALT: 19 U/L (ref 17–63)
AST: 24 U/L (ref 15–41)
Albumin: 4.1 g/dL (ref 3.5–5.0)
Anion gap: 8 (ref 5–15)
BUN: 5 mg/dL — AB (ref 6–20)
CALCIUM: 9.1 mg/dL (ref 8.9–10.3)
CHLORIDE: 106 mmol/L (ref 101–111)
CO2: 23 mmol/L (ref 22–32)
CREATININE: 0.76 mg/dL (ref 0.61–1.24)
GFR calc non Af Amer: >60 mL/min (ref >60)
Glucose, Bld: 107 mg/dL — ABNORMAL HIGH (ref 65–99)
Potassium: 3.6 mmol/L (ref 3.5–5.1)
Sodium: 137 mmol/L (ref 135–145)
Total Bilirubin: 1.3 mg/dL — ABNORMAL HIGH (ref 0.3–1.2)
Total Protein: 7.1 g/dL (ref 6.5–8.1)

## 2017-09-05 LAB — URINALYSIS, ROUTINE W REFLEX MICROSCOPIC
BILIRUBIN URINE: NEGATIVE
GLUCOSE, UA: NEGATIVE mg/dL
HGB URINE DIPSTICK: NEGATIVE
KETONES UR: NEGATIVE mg/dL
Leukocytes, UA: NEGATIVE
Nitrite: NEGATIVE
PROTEIN: NEGATIVE mg/dL
Specific Gravity, Urine: 1.005 (ref 1.005–1.030)
pH: 6 (ref 5.0–8.0)

## 2017-09-05 LAB — CBC
HCT: 43.1 % (ref 39.0–52.0)
Hemoglobin: 15.2 g/dL (ref 13.0–17.0)
MCH: 30.3 pg (ref 26.0–34.0)
MCHC: 35.3 g/dL (ref 30.0–36.0)
MCV: 85.9 fL (ref 78.0–100.0)
PLATELETS: 167 10*3/uL (ref 150–400)
RBC: 5.02 MIL/uL (ref 4.22–5.81)
RDW: 12.1 % (ref 11.5–15.5)
WBC: 5 10*3/uL (ref 4.0–10.5)

## 2017-09-05 LAB — LIPASE, BLOOD: Lipase: 26 U/L (ref 11–51)

## 2017-09-05 MED ORDER — OMEPRAZOLE 20 MG PO CPDR: 20.0000 mg | DELAYED_RELEASE_CAPSULE | Freq: Every day | ORAL | 0 refills | Status: DC

## 2017-09-05 MED ORDER — ONDANSETRON HCL 4 MG/2ML IJ SOLN
4.0000 mg | Freq: Once | INTRAMUSCULAR | Status: AC
  Administered 2017-09-05: 4 mg via INTRAVENOUS
  Filled 2017-09-05: qty 2

## 2017-09-05 MED ORDER — SUCRALFATE 1 G PO TABS: 1.0000 g | ORAL_TABLET | Freq: Three times a day (TID) | ORAL | 0 refills | Status: DC

## 2017-09-05 MED ORDER — FAMOTIDINE IN NACL 20-0.9 MG/50ML-% IV SOLN
20.0000 mg | Freq: Once | INTRAVENOUS | Status: AC
  Administered 2017-09-05: 20 mg via INTRAVENOUS
  Filled 2017-09-05: qty 50

## 2017-09-05 MED ORDER — FAMOTIDINE 20 MG PO TABS: 20.0000 mg | ORAL_TABLET | Freq: Two times a day (BID) | ORAL | 0 refills | Status: DC

## 2017-09-05 MED ORDER — GI COCKTAIL ~~LOC~~
30.0000 mL | Freq: Once | ORAL | Status: AC
  Administered 2017-09-05: 30 mL via ORAL
  Filled 2017-09-05: qty 30

## 2017-09-05 MED ORDER — SODIUM CHLORIDE 0.9 % IV BOLUS (SEPSIS)
1000.0000 mL | Freq: Once | INTRAVENOUS | Status: AC
  Administered 2017-09-05: 1000 mL via INTRAVENOUS

## 2017-09-05 NOTE — ED Notes (Signed)
Pt returned from X-ray.  

## 2017-09-05 NOTE — Discharge Instructions (Signed)
Restart prilosec, pepcid, carafate. See diet recommendations. Follow up with family doctor.

## 2017-09-05 NOTE — ED Notes (Signed)
Pt given specimen cup. 

## 2017-09-05 NOTE — ED Provider Notes (Signed)
[facility] DEPT Provider Note   CSN: [id number] Arrival date & time: 09/05/17  1245     History   Chief Complaint Chief Complaint  Patient presents with  . Abdominal Pain    HPI Jorge [name] is a 66 y.o. male.  HPI  Jorge [name] is a 66 y.o. malehistory of gastritis, diabetes, hypertension, presents to emergency department complaining of abdominal pain. Patient reports epigastric pain that radiates into his chest and his throat. This is a recurrent problem for this patient. This pain started yesterday. Patient denies any nausea or vomiting. Pain is worsened with eating, however patient still able to eat and drink. No diarrhea. No fever or chills. He is currently taking tramadol for his pain. Patient is supposed to be on acid reflux medications but he is currently not taking them. Denies any black or tarry stools. Denies any bright red blood per rectum. No urinary symptoms. No other complaints.  Endoscopy on 5/18 showing diffuse gastritis.    Past Medical History:  Diagnosis Date  . Acid reflux   . Arthritis    right leg  . Cataract    bil removed  . Diabetes mellitus without complication ([place])   . Hypertension     Patient Active Problem List   Diagnosis Date Noted  . Abdominal pain, epigastric 05/09/2017  . Lumbosacral spondylosis with radiculopathy 12/25/2016    Past Surgical History:  Procedure Laterality Date  . cataracts Bilateral   . LUMBAR LAMINECTOMY/DECOMPRESSION MICRODISCECTOMY Right 12/25/2016   Procedure: Lumbar three-five Laminectomy, Right Lumbar three-four, Lumbar four-five Diskectomy ;  Surgeon: Kevan Ny Ditty, MD;  Location: [facility];  Service: Neurosurgery;  Laterality: Right;       Home Medications    Prior to Admission medications   Medication Sig Start Date End Date Taking? Authorizing Provider  amLODipine (NORVASC) 5 MG tablet Take 5 mg by mouth daily. 07/16/16  Yes [provider]  atorvastatin (LIPITOR) 40 MG tablet Take 40 mg by  mouth daily at 6 PM.   Yes [provider]  gabapentin (NEURONTIN) 100 MG capsule Take 100 mg by mouth 2 (two) times daily. 08/23/17  Yes [provider]  traMADol (ULTRAM) 50 MG tablet Take 50 mg by mouth 2 (two) times daily as needed for pain. 07/30/17  Yes [provider]  dicyclomine (BENTYL) 20 MG tablet Take 1 tablet (20 mg total) by mouth 2 (two) times daily. Patient not taking: Reported on 09/05/2017 05/01/17   Frederica Kuster, PA-C  famotidine (PEPCID) 40 MG tablet Take 1 tablet (40 mg total) by mouth at bedtime. Patient not taking: Reported on 09/05/2017 04/18/17   Nona Dell, PA-C  pantoprazole (PROTONIX) 40 MG tablet Take 1 tablet (40 mg total) by mouth 2 (two) times daily. Patient not taking: Reported on 09/05/2017 06/16/17   Horton, Barbette Hair, MD  sucralfate (CARAFATE) 1 GM/10ML suspension Take 10 mLs (1 g total) by mouth 4 (four) times daily -  with meals and at bedtime. Patient not taking: Reported on 08/15/2017 05/26/17   Carmin Muskrat, MD    Family History Family History  Problem Relation Age of Onset  . Colon cancer Neg Hx   . Esophageal cancer Neg Hx   . Pancreatic cancer Neg Hx   . Prostate cancer Neg Hx   . Rectal cancer Neg Hx   . Stomach cancer Neg Hx     Social History Social History  Substance Use Topics  . Smoking status: Former Smoker  Packs/day: 0.25    Years: 40.00    Quit date: 12/17/2011  . Smokeless tobacco: Never Used  . Alcohol use No     Allergies   Patient has no known allergies.   Review of Systems Review of Systems  Constitutional: Negative for chills and fever.  Respiratory: Negative for cough, chest tightness and shortness of breath.   Cardiovascular: Negative for chest pain, palpitations and leg swelling.  Gastrointestinal: Positive for abdominal pain. Negative for abdominal distention, diarrhea, nausea and vomiting.  Genitourinary: Negative for dysuria, frequency, hematuria and urgency.    Musculoskeletal: Negative for arthralgias, myalgias, neck pain and neck stiffness.  Skin: Negative for rash.  Allergic/Immunologic: Negative for immunocompromised state.  Neurological: Negative for dizziness, weakness, light-headedness, numbness and headaches.  All other systems reviewed and are negative.    Physical Exam Updated Vital Signs BP (!) 141/80 (BP Location: Right Arm)   Pulse (!) 55   Temp (!) 97.4 F (36.3 C) (Oral)   Resp 18   Ht 5\' 2"  (1.575 m)   Wt 74.8 kg (165 lb)   SpO2 94%   BMI 30.18 kg/m   Physical Exam  Constitutional: He appears well-developed and well-nourished. No distress.  HENT:  Head: Normocephalic and atraumatic.  Eyes: Conjunctivae are normal.  Neck: Neck supple.  Cardiovascular: Normal rate, regular rhythm and normal heart sounds.   Pulmonary/Chest: Effort normal. No respiratory distress. He has no wheezes. He has no rales.  Abdominal: Soft. Bowel sounds are normal. He exhibits no distension. There is tenderness. There is no rebound and no guarding.  Epigastric tenderness  Musculoskeletal: He exhibits no edema.  Neurological: He is alert.  Skin: Skin is warm and dry.  Nursing note and vitals reviewed.    ED Treatments / Results  Labs (all labs ordered are listed, but only abnormal results are displayed) Labs Reviewed  COMPREHENSIVE METABOLIC PANEL - Abnormal; Notable for the following:       Result Value   Glucose, Bld 107 (*)    BUN 5 (*)    Total Bilirubin 1.3 (*)    All other components within normal limits  URINALYSIS, ROUTINE W REFLEX MICROSCOPIC - Abnormal; Notable for the following:    Color, Urine STRAW (*)    All other components within normal limits  LIPASE, BLOOD  CBC    EKG  EKG Interpretation None       Radiology No results found.  Procedures Procedures (including critical care time)  Medications Ordered in ED Medications  gi cocktail (Maalox,Lidocaine,Donnatal) (not administered)  ondansetron  (ZOFRAN) injection 4 mg (not administered)  sodium chloride 0.9 % bolus 1,000 mL (not administered)  famotidine (PEPCID) IVPB 20 mg premix (not administered)     Initial Impression / Assessment and Plan / ED Course  I have reviewed the triage vital signs and the nursing notes.  Pertinent labs & imaging results that were available during my care of the patient were reviewed by me and considered in my medical decision making (see chart for details).     Patient to the emergency department with epigastric pain. This is a recurrent problem. Patient had an endoscopy in May 2018, which showed diffuse gastritis. His most to be on PPI, and H2 blocker, but I reviewed patient's medications and look through all his medicines and his back and he is not taking any of those. Patient's son states that he used to take him but ran out. Patient is not actively vomiting. His abdomen is soft. No surgical  abdomen. We'll get acute abdominal series, labs already resulted in an unremarkable. Urinalysis is negative. We'll give GI cocktail, Pepcid, Zofran. Will give IV fluids.  8:27 AM Xray negative. Pt feels better.  Return precautions discussed. Tolerating oral fluids.Will restart on PPI and H2 blocker, will add Carafate, follow-up with family doctor.  Vitals:   09/05/17 0835 09/05/17 0900 09/05/17 0930 09/05/17 0948  BP: 130/78 123/78 132/77   Pulse: (!) 57 (!) 52 (!) 54   Resp: 16     Temp: 97.8 F (36.6 C)   97.8 F (36.6 C)  TempSrc: Oral   Oral  SpO2: 97% 95% 96%   Weight:      Height:         Final Clinical Impressions(s) / ED Diagnoses   Final diagnoses:  Chronic gastritis without bleeding, unspecified gastritis type    New Prescriptions Discharge Medication List as of 09/05/2017  8:32 AM    START taking these medications   Details  omeprazole (PRILOSEC) 20 MG capsule Take 1 capsule (20 mg total) by mouth daily., Starting Sun 09/05/2017, Print    sucralfate (CARAFATE) 1 g tablet Take 1  tablet (1 g total) by mouth 4 (four) times daily -  with meals and at bedtime., Starting Sun 09/05/2017, Print         Jeannett Senior, PA-C 09/05/17 1641    Macarthur Critchley, MD 09/12/17 1400

## 2017-09-05 NOTE — ED Notes (Signed)
Pt taken to XRay 

## 2017-09-05 NOTE — ED Triage Notes (Signed)
Pt c/o 9/10 RLQ abd pain, denies nausea, vomiting, diarrhea, fever or chills.

## 2017-09-25 ENCOUNTER — Encounter (HOSPITAL_COMMUNITY): Payer: Self-pay | Admitting: *Deleted

## 2017-09-25 ENCOUNTER — Emergency Department (HOSPITAL_COMMUNITY): Payer: Medicaid Other

## 2017-09-25 ENCOUNTER — Emergency Department (HOSPITAL_COMMUNITY)
Admission: EM | Admit: 2017-09-25 | Discharge: 2017-09-25 | Disposition: A | Discharge status: Home / Self Care | Payer: Medicaid Other | Attending: Emergency Medicine | Admitting: Emergency Medicine

## 2017-09-25 DIAGNOSIS — R1031 Right lower quadrant pain: Secondary | ICD-10-CM

## 2017-09-25 DIAGNOSIS — N390 Urinary tract infection, site not specified: Secondary | ICD-10-CM | POA: Diagnosis not present

## 2017-09-25 DIAGNOSIS — I10 Essential (primary) hypertension: Secondary | ICD-10-CM | POA: Diagnosis not present

## 2017-09-25 DIAGNOSIS — K5900 Constipation, unspecified: Secondary | ICD-10-CM | POA: Insufficient documentation

## 2017-09-25 DIAGNOSIS — E119 Type 2 diabetes mellitus without complications: Secondary | ICD-10-CM | POA: Insufficient documentation

## 2017-09-25 DIAGNOSIS — Z79899 Other long term (current) drug therapy: Secondary | ICD-10-CM | POA: Insufficient documentation

## 2017-09-25 DIAGNOSIS — R109 Unspecified abdominal pain: Secondary | ICD-10-CM | POA: Diagnosis present

## 2017-09-25 DIAGNOSIS — R8281 Pyuria: Secondary | ICD-10-CM

## 2017-09-25 DIAGNOSIS — Z87891 Personal history of nicotine dependence: Secondary | ICD-10-CM | POA: Diagnosis not present

## 2017-09-25 LAB — URINALYSIS, ROUTINE W REFLEX MICROSCOPIC
Bilirubin Urine: NEGATIVE
GLUCOSE, UA: NEGATIVE mg/dL
Hgb urine dipstick: NEGATIVE
KETONES UR: NEGATIVE mg/dL
Nitrite: NEGATIVE
PH: 7 (ref 5.0–8.0)
Protein, ur: NEGATIVE mg/dL
Specific Gravity, Urine: 1.017 (ref 1.005–1.030)
Squamous Epithelial / LPF: NONE SEEN

## 2017-09-25 LAB — COMPREHENSIVE METABOLIC PANEL
ALK PHOS: 86 U/L (ref 38–126)
ALT: 18 U/L (ref 17–63)
AST: 27 U/L (ref 15–41)
Albumin: 4.2 g/dL (ref 3.5–5.0)
Anion gap: 9 (ref 5–15)
BILIRUBIN TOTAL: 1 mg/dL (ref 0.3–1.2)
BUN: 5 mg/dL — ABNORMAL LOW (ref 6–20)
CALCIUM: 9.2 mg/dL (ref 8.9–10.3)
CO2: 22 mmol/L (ref 22–32)
CREATININE: 0.81 mg/dL (ref 0.61–1.24)
Chloride: 106 mmol/L (ref 101–111)
Glucose, Bld: 118 mg/dL — ABNORMAL HIGH (ref 65–99)
Potassium: 3.6 mmol/L (ref 3.5–5.1)
Sodium: 137 mmol/L (ref 135–145)
Total Protein: 7 g/dL (ref 6.5–8.1)

## 2017-09-25 LAB — LIPASE, BLOOD: Lipase: 24 U/L (ref 11–51)

## 2017-09-25 LAB — CBC
HCT: 41.4 % (ref 39.0–52.0)
Hemoglobin: 14.3 g/dL (ref 13.0–17.0)
MCH: 29.7 pg (ref 26.0–34.0)
MCHC: 34.5 g/dL (ref 30.0–36.0)
MCV: 85.9 fL (ref 78.0–100.0)
PLATELETS: 171 10*3/uL (ref 150–400)
RBC: 4.82 MIL/uL (ref 4.22–5.81)
RDW: 12.5 % (ref 11.5–15.5)
WBC: 4 10*3/uL (ref 4.0–10.5)

## 2017-09-25 MED ORDER — KETOROLAC TROMETHAMINE 30 MG/ML IJ SOLN
30.0000 mg | Freq: Once | INTRAMUSCULAR | Status: AC
  Administered 2017-09-25: 30 mg via INTRAVENOUS
  Filled 2017-09-25: qty 1

## 2017-09-25 MED ORDER — POLYETHYLENE GLYCOL 3350 17 GM/SCOOP PO POWD: 17.0000 g | Freq: Every day | ORAL | 0 refills | Status: DC

## 2017-09-25 MED ORDER — MORPHINE SULFATE (PF) 4 MG/ML IV SOLN
4.0000 mg | Freq: Once | INTRAVENOUS | Status: AC
  Administered 2017-09-25: 4 mg via INTRAVENOUS
  Filled 2017-09-25: qty 1

## 2017-09-25 MED ORDER — CEPHALEXIN 500 MG PO CAPS: 500.0000 mg | ORAL_CAPSULE | Freq: Three times a day (TID) | ORAL | 0 refills | Status: DC

## 2017-09-25 NOTE — ED Provider Notes (Signed)
[facility] DEPT Provider Note   CSN: [id number] Arrival date & time: 09/25/17  0032     History   Chief Complaint Chief Complaint  Patient presents with  . Abdominal Pain    HPI KINNETH FUJIWARA is a 66 y.o. male.  HPI Patient is a 66 year old male with a history of recurrent right-sided abdominal pain.  Presents with constipation and right-sided abdominal discomfort worsening over the past several days.  There is some radiation of his right-sided abdominal pain towards his right flank.  No dysuria or urinary frequency.  No fevers or chills.  Denies nausea vomiting.  He does report upper abdominal burning sensation that radiates up into his chest.  He's tried Carafate without improvement in his symptoms.  He has seen a gastroenterologist.  He reports endoscopy approximate 2 months ago without significant abnormalities noted on it.  Symptoms are mild to moderate in severity.  Symptoms are worse at night.  Past Medical History:  Diagnosis Date  . Acid reflux   . Arthritis    right leg  . Cataract    bil removed  . Diabetes mellitus without complication ([place])   . Hypertension     Patient Active Problem List   Diagnosis Date Noted  . Abdominal pain, epigastric 05/09/2017  . Lumbosacral spondylosis with radiculopathy 12/25/2016    Past Surgical History:  Procedure Laterality Date  . cataracts Bilateral   . LUMBAR LAMINECTOMY/DECOMPRESSION MICRODISCECTOMY Right 12/25/2016   Procedure: Lumbar three-five Laminectomy, Right Lumbar three-four, Lumbar four-five Diskectomy ;  Surgeon: Kevan Ny Ditty, MD;  Location: [facility];  Service: Neurosurgery;  Laterality: Right;       Home Medications    Prior to Admission medications   Medication Sig Start Date End Date Taking? Authorizing Provider  amLODipine (NORVASC) 5 MG tablet Take 5 mg by mouth daily. 07/16/16   [provider]  atorvastatin (LIPITOR) 40 MG tablet Take 40 mg by mouth daily at 6 PM.    [provider]  cephALEXin (KEFLEX) 500 MG capsule Take 1 capsule (500 mg total) by mouth 3 (three) times daily. 09/25/17   Jola Schmidt, MD  dicyclomine (BENTYL) 20 MG tablet Take 1 tablet (20 mg total) by mouth 2 (two) times daily. Patient not taking: Reported on 09/05/2017 05/01/17   Frederica Kuster, PA-C  famotidine (PEPCID) 20 MG tablet Take 1 tablet (20 mg total) by mouth 2 (two) times daily. 09/05/17   Kirichenko, Tatyana, PA-C  gabapentin (NEURONTIN) 100 MG capsule Take 100 mg by mouth 2 (two) times daily. 08/23/17   [provider]  omeprazole (PRILOSEC) 20 MG capsule Take 1 capsule (20 mg total) by mouth daily. 09/05/17   Kirichenko, Tatyana, PA-C  pantoprazole (PROTONIX) 40 MG tablet Take 1 tablet (40 mg total) by mouth 2 (two) times daily. Patient not taking: Reported on 09/05/2017 06/16/17   Horton, Barbette Hair, MD  polyethylene glycol powder (MIRALAX) powder Take 17 g by mouth daily. 09/25/17   Jola Schmidt, MD  sucralfate (CARAFATE) 1 g tablet Take 1 tablet (1 g total) by mouth 4 (four) times daily -  with meals and at bedtime. 09/05/17   Kirichenko, Tatyana, PA-C  traMADol (ULTRAM) 50 MG tablet Take 50 mg by mouth 2 (two) times daily as needed for pain. 07/30/17   [provider]    Family History Family History  Problem Relation Age of Onset  . Colon cancer Neg Hx   . Esophageal cancer Neg Hx   . Pancreatic cancer  Neg Hx   . Prostate cancer Neg Hx   . Rectal cancer Neg Hx   . Stomach cancer Neg Hx     Social History Social History  Substance Use Topics  . Smoking status: Former Smoker    Packs/day: 0.25    Years: 40.00    Quit date: 12/17/2011  . Smokeless tobacco: Never Used  . Alcohol use No     Allergies   Patient has no known allergies.   Review of Systems Review of Systems  All other systems reviewed and are negative.    Physical Exam Updated Vital Signs BP (!) 153/81   Pulse 61   Temp (!) 97.5 F (36.4 C) (Oral)   Resp 16   Ht 5\' 2"   (1.575 m)   SpO2 97%   Physical Exam  Constitutional: He is oriented to person, place, and time. He appears well-developed and well-nourished.  HENT:  Head: Normocephalic and atraumatic.  Eyes: EOM are normal.  Neck: Normal range of motion.  Cardiovascular: Normal rate, regular rhythm, normal heart sounds and intact distal pulses.   Pulmonary/Chest: Effort normal and breath sounds normal. No respiratory distress.  Abdominal: Soft. He exhibits no distension.  Mild right-sided abdominal tenderness without guarding or rebound.  No peritoneal signs.  Musculoskeletal: Normal range of motion.  Neurological: He is alert and oriented to person, place, and time.  Skin: Skin is warm and dry.  Psychiatric: He has a normal mood and affect. Judgment normal.  Nursing note and vitals reviewed.    ED Treatments / Results  Labs (all labs ordered are listed, but only abnormal results are displayed) Labs Reviewed  COMPREHENSIVE METABOLIC PANEL - Abnormal; Notable for the following:       Result Value   Glucose, Bld 118 (*)    BUN 5 (*)    All other components within normal limits  URINALYSIS, ROUTINE W REFLEX MICROSCOPIC - Abnormal; Notable for the following:    APPearance HAZY (*)    Leukocytes, UA MODERATE (*)    Bacteria, UA RARE (*)    All other components within normal limits  URINE CULTURE  LIPASE, BLOOD  CBC    EKG  EKG Interpretation None       Radiology Ct Renal Stone Study  Result Date: 09/25/2017 CLINICAL DATA:  Abdominal pain for 1 month. Constipation with epigastric region burning sensation. EXAM: CT ABDOMEN AND PELVIS WITHOUT CONTRAST TECHNIQUE: Multidetector CT imaging of the abdomen and pelvis was performed following the standard protocol without IV contrast. COMPARISON:  08/15/2017 FINDINGS: Lower chest:  No acute finding Hepatobiliary: Stable subcentimeter low-density in the left liver that is stable since December 2017 at least.No evidence of biliary obstruction or  stone. Pancreas: Unremarkable. Spleen: Unremarkable. Adrenals/Urinary Tract: Negative adrenals. No hydronephrosis or stone. Unremarkable bladder. Stomach/Bowel: Formed stool distends the colon diffusely. No appendicitis. Vascular/Lymphatic: No acute vascular abnormality. No mass or adenopathy. Reproductive:Negative. Other: No ascites or pneumoperitoneum. Musculoskeletal: No acute abnormalities. 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 L4 and L5 laminectomies. L3 spinous process chronic fragmentation. IMPRESSION: 1. No hydronephrosis or urolithiasis. 2. Formed stool throughout the colon, please correlate for constipation. Electronically Signed   By: Monte Fantasia M.D.   On: 09/25/2017 10:27    Procedures Procedures (including critical care time)  Medications Ordered in ED Medications  morphine 4 MG/ML injection 4 mg (4 mg Intravenous Given 09/25/17 0911)  ketorolac (TORADOL) 30 MG/ML injection 30 mg (30 mg Intravenous Given 09/25/17 0911)     Initial  Impression / Assessment and Plan / ED Course  I have reviewed the triage vital signs and the nursing notes.  Pertinent labs & imaging results that were available during my care of the patient were reviewed by me and considered in my medical decision making (see chart for details).     No prior urine cultures obtained.  Urine culture sent today.  Patient does have pyuria.  Patient be treated with antibiotics for possible UTI/pyelonephritis.  Much of this may be secondary to constipation.  He'll be started on daily MiraLAX.  Primary care and GI follow-up.  He understands return to the ER for new or worsening symptoms.  Vital signs are stable.  Final Clinical Impressions(s) / ED Diagnoses   Final diagnoses:  Right lower quadrant abdominal pain  Pyuria  Constipation, unspecified constipation type    New Prescriptions New Prescriptions   CEPHALEXIN (KEFLEX) 500 MG CAPSULE    Take 1 capsule (500 mg total) by mouth 3 (three) times  daily.   POLYETHYLENE GLYCOL POWDER (MIRALAX) POWDER    Take 17 g by mouth daily.     Jola Schmidt, MD 09/25/17 1046

## 2017-09-25 NOTE — ED Triage Notes (Signed)
The pt is c/o abd pain with constipation for one month  No n v or diarrhea  He has a burning in his upper abd  He takes carafate for that but it is not helping

## 2017-09-25 NOTE — ED Notes (Signed)
Patient transported to CT 

## 2017-09-26 LAB — URINE CULTURE: Culture: NO GROWTH

## 2017-10-12 ENCOUNTER — Emergency Department (HOSPITAL_COMMUNITY): Payer: Medicaid Other

## 2017-10-12 ENCOUNTER — Encounter (HOSPITAL_COMMUNITY): Payer: Self-pay | Admitting: *Deleted

## 2017-10-12 ENCOUNTER — Emergency Department (HOSPITAL_COMMUNITY)
Admission: EM | Admit: 2017-10-12 | Discharge: 2017-10-13 | Disposition: A | Discharge status: Home / Self Care | Payer: Medicaid Other | Attending: Emergency Medicine | Admitting: Emergency Medicine

## 2017-10-12 DIAGNOSIS — R05 Cough: Secondary | ICD-10-CM | POA: Diagnosis not present

## 2017-10-12 DIAGNOSIS — I10 Essential (primary) hypertension: Secondary | ICD-10-CM | POA: Insufficient documentation

## 2017-10-12 DIAGNOSIS — E119 Type 2 diabetes mellitus without complications: Secondary | ICD-10-CM | POA: Diagnosis not present

## 2017-10-12 DIAGNOSIS — Z87891 Personal history of nicotine dependence: Secondary | ICD-10-CM | POA: Insufficient documentation

## 2017-10-12 DIAGNOSIS — Z79899 Other long term (current) drug therapy: Secondary | ICD-10-CM | POA: Diagnosis not present

## 2017-10-12 DIAGNOSIS — R079 Chest pain, unspecified: Secondary | ICD-10-CM | POA: Diagnosis present

## 2017-10-12 DIAGNOSIS — R059 Cough, unspecified: Secondary | ICD-10-CM

## 2017-10-12 DIAGNOSIS — R0789 Other chest pain: Secondary | ICD-10-CM | POA: Diagnosis not present

## 2017-10-12 LAB — CBC
HCT: 43.3 % (ref 39.0–52.0)
Hemoglobin: 15.3 g/dL (ref 13.0–17.0)
MCH: 30.2 pg (ref 26.0–34.0)
MCHC: 35.3 g/dL (ref 30.0–36.0)
MCV: 85.6 fL (ref 78.0–100.0)
PLATELETS: 183 10*3/uL (ref 150–400)
RBC: 5.06 MIL/uL (ref 4.22–5.81)
RDW: 12.2 % (ref 11.5–15.5)
WBC: 4.5 10*3/uL (ref 4.0–10.5)

## 2017-10-12 LAB — BASIC METABOLIC PANEL
Anion gap: 7 (ref 5–15)
BUN: 9 mg/dL (ref 6–20)
CALCIUM: 9.1 mg/dL (ref 8.9–10.3)
CO2: 24 mmol/L (ref 22–32)
CREATININE: 0.77 mg/dL (ref 0.61–1.24)
Chloride: 106 mmol/L (ref 101–111)
GFR calc non Af Amer: >60 mL/min (ref >60)
Glucose, Bld: 120 mg/dL — ABNORMAL HIGH (ref 65–99)
Potassium: 3.7 mmol/L (ref 3.5–5.1)
SODIUM: 137 mmol/L (ref 135–145)

## 2017-10-12 LAB — I-STAT TROPONIN, ED: TROPONIN I, POC: 0 ng/mL (ref 0.00–0.08)

## 2017-10-12 NOTE — ED Triage Notes (Signed)
Pt is here for chest pain.  Translation by his daughter (I pffered translating services and they declined).  Pt reports that he has been having CP for over a year. Pt also reports cough and soreness in chest and ribs with coughing. Cough began Saturday.  No sob, no fever or chills with this.  Pt feels that the chest pain and the cough are not related

## 2017-10-13 LAB — I-STAT TROPONIN, ED: TROPONIN I, POC: 0 ng/mL (ref 0.00–0.08)

## 2017-10-13 LAB — D-DIMER, QUANTITATIVE (NOT AT ARMC): D DIMER QUANT: 0.42 ug{FEU}/mL (ref 0.00–0.50)

## 2017-10-13 MED ORDER — GI COCKTAIL ~~LOC~~
30.0000 mL | Freq: Once | ORAL | Status: AC
  Administered 2017-10-13: 30 mL via ORAL
  Filled 2017-10-13: qty 30

## 2017-10-13 MED ORDER — TRAMADOL HCL 50 MG PO TABS: 50.0000 mg | ORAL_TABLET | Freq: Two times a day (BID) | ORAL | 0 refills | Status: DC | PRN

## 2017-10-13 MED ORDER — BENZONATATE 100 MG PO CAPS: 200.0000 mg | ORAL_CAPSULE | Freq: Two times a day (BID) | ORAL | 0 refills | Status: DC | PRN

## 2017-10-13 MED ORDER — ONDANSETRON HCL 4 MG/2ML IJ SOLN
4.0000 mg | Freq: Once | INTRAMUSCULAR | Status: AC
  Administered 2017-10-13: 4 mg via INTRAVENOUS
  Filled 2017-10-13: qty 2

## 2017-10-13 MED ORDER — MORPHINE SULFATE (PF) 4 MG/ML IV SOLN
4.0000 mg | Freq: Once | INTRAVENOUS | Status: AC
  Administered 2017-10-13: 4 mg via INTRAVENOUS
  Filled 2017-10-13: qty 1

## 2017-10-13 NOTE — ED Notes (Signed)
PT states understanding of care given, follow up care, and medication prescribed. PT ambulated from ED to car with a steady gait. 

## 2017-10-13 NOTE — ED Provider Notes (Signed)
[facility] EMERGENCY DEPARTMENT Provider Note   CSN: [id number] Arrival date & time: 10/12/17  2019     History   Chief Complaint Chief Complaint  Patient presents with  . Chest Pain    HPI Jorge [name] is a 66 y.o. male.  Patient with PMH of DM, HTN, acid reflux presents to the ED with a chief complaint of chest pain.  He states that the symptoms have been ongoing for the past 3 weeks.  He denies any SOB.  He reports that he has had some cough, but denies any fevers, chills, or productive cough.  He states that he has not taken anything for these symptoms.  He denies any hx of ACS, PE, or DVT.  The symptoms are worsened with palpation of his chest.  There are no other associated symptoms or modifying factors.   The history is provided by the patient. The history is limited by a language barrier. No language interpreter was used (daughter requests to translate).    Past Medical History:  Diagnosis Date  . Acid reflux   . Arthritis    right leg  . Cataract    bil removed  . Diabetes mellitus without complication ([place])   . Hypertension     Patient Active Problem List   Diagnosis Date Noted  . Abdominal pain, epigastric 05/09/2017  . Lumbosacral spondylosis with radiculopathy 12/25/2016    Past Surgical History:  Procedure Laterality Date  . cataracts Bilateral   . LUMBAR LAMINECTOMY/DECOMPRESSION MICRODISCECTOMY Right 12/25/2016   Procedure: Lumbar three-five Laminectomy, Right Lumbar three-four, Lumbar four-five Diskectomy ;  Surgeon: Kevan Ny Ditty, MD;  Location: [facility];  Service: Neurosurgery;  Laterality: Right;       Home Medications    Prior to Admission medications   Medication Sig Start Date End Date Taking? Authorizing Provider  amLODipine (NORVASC) 5 MG tablet Take 5 mg by mouth daily. 07/16/16   [provider]  atorvastatin (LIPITOR) 40 MG tablet Take 40 mg by mouth daily at 6 PM.    [provider]    cephALEXin (KEFLEX) 500 MG capsule Take 1 capsule (500 mg total) by mouth 3 (three) times daily. 09/25/17   Jola Schmidt, MD  dicyclomine (BENTYL) 20 MG tablet Take 1 tablet (20 mg total) by mouth 2 (two) times daily. Patient not taking: Reported on 09/05/2017 05/01/17   Frederica Kuster, PA-C  famotidine (PEPCID) 20 MG tablet Take 1 tablet (20 mg total) by mouth 2 (two) times daily. 09/05/17   Kirichenko, Tatyana, PA-C  gabapentin (NEURONTIN) 100 MG capsule Take 100 mg by mouth 2 (two) times daily. 08/23/17   [provider]  omeprazole (PRILOSEC) 20 MG capsule Take 1 capsule (20 mg total) by mouth daily. 09/05/17   Kirichenko, Tatyana, PA-C  pantoprazole (PROTONIX) 40 MG tablet Take 1 tablet (40 mg total) by mouth 2 (two) times daily. Patient not taking: Reported on 09/05/2017 06/16/17   Horton, Barbette Hair, MD  polyethylene glycol powder (MIRALAX) powder Take 17 g by mouth daily. 09/25/17   Jola Schmidt, MD  sucralfate (CARAFATE) 1 g tablet Take 1 tablet (1 g total) by mouth 4 (four) times daily -  with meals and at bedtime. 09/05/17   Kirichenko, Tatyana, PA-C  traMADol (ULTRAM) 50 MG tablet Take 50 mg by mouth 2 (two) times daily as needed for pain. 07/30/17   [provider]    Family History Family History  Problem Relation Age of Onset  .  Colon cancer Neg Hx   . Esophageal cancer Neg Hx   . Pancreatic cancer Neg Hx   . Prostate cancer Neg Hx   . Rectal cancer Neg Hx   . Stomach cancer Neg Hx     Social History Social History  Substance Use Topics  . Smoking status: Former Smoker    Packs/day: 0.25    Years: 40.00    Quit date: 12/17/2011  . Smokeless tobacco: Never Used  . Alcohol use No     Allergies   Patient has no known allergies.   Review of Systems Review of Systems  All other systems reviewed and are negative.    Physical Exam Updated Vital Signs BP 128/79   Pulse (!) 50   Temp 97.7 F (36.5 C) (Oral)   Resp 14   SpO2 98%   Physical Exam   Constitutional: He is oriented to person, place, and time. He appears well-developed and well-nourished.  HENT:  Head: Normocephalic and atraumatic.  Eyes: Pupils are equal, round, and reactive to light. Conjunctivae and EOM are normal. Right eye exhibits no discharge. Left eye exhibits no discharge. No scleral icterus.  Neck: Normal range of motion. Neck supple. No JVD present.  Cardiovascular: Normal rate, regular rhythm and normal heart sounds.  Exam reveals no gallop and no friction rub.   No murmur heard. Pulmonary/Chest: Effort normal and breath sounds normal. No respiratory distress. He has no wheezes. He has no rales. He exhibits tenderness.  Anterior chest wall tenderness  Abdominal: Soft. He exhibits no distension and no mass. There is no tenderness. There is no rebound and no guarding.  Musculoskeletal: Normal range of motion. He exhibits no edema or tenderness.  Neurological: He is alert and oriented to person, place, and time.  Skin: Skin is warm and dry.  Psychiatric: He has a normal mood and affect. His behavior is normal. Judgment and thought content normal.  Nursing note and vitals reviewed.    ED Treatments / Results  Labs (all labs ordered are listed, but only abnormal results are displayed) Labs Reviewed  BASIC METABOLIC PANEL - Abnormal; Notable for the following:       Result Value   Glucose, Bld 120 (*)    All other components within normal limits  CBC  D-DIMER, QUANTITATIVE (NOT AT [facility])  I-STAT TROPONIN, ED  I-STAT TROPONIN, ED    EKG  EKG Interpretation None       Radiology Dg Chest 2 View  Result Date: 10/12/2017 CLINICAL DATA:  Cough and dyspnea with generalized chest pain and fever x4 days. EXAM: CHEST  2 VIEW COMPARISON:  09/05/2017 FINDINGS: The heart size and mediastinal contours are within normal limits. Both lungs are clear. No acute nor suspicious osseous abnormalities. Mild degenerative changes are noted along the dorsal spine.  IMPRESSION: No active cardiopulmonary disease. Electronically Signed   By: Ashley Royalty M.D.   On: 10/12/2017 21:30    Procedures Procedures (including critical care time)  Medications Ordered in ED Medications  morphine 4 MG/ML injection 4 mg (not administered)  ondansetron (ZOFRAN) injection 4 mg (not administered)     Initial Impression / Assessment and Plan / ED Course  I have reviewed the triage vital signs and the nursing notes.  Pertinent labs & imaging results that were available during my care of the patient were reviewed by me and considered in my medical decision making (see chart for details).     Patient with anterior chest wall pain.  Easily  reproducible with palpation.  VSS.  Labs are thus far reassuring.  Given hx of DM, HTN, and age, will check delta trop.  Will also check D-dimer.  Will treat symptoms and reassess.  I doubt PE or ACS.  Labs are reassuring.  No ischemic EKG changes.  Doubt dissection, no numbness, or weakness, no tearing chest pain, CXR is unremarkable.  Could be GERD, will give GI cocktail.  He could also have some contributing intercostal straining from his cough.   D dimer is negative.  Repeat troponin is negative.  Discussed with Dr. Leonides Schanz, who agrees with outpatient follow-up.  Patient understands and agrees with the plan.  Final Clinical Impressions(s) / ED Diagnoses   Final diagnoses:  Chest wall pain  Cough    New Prescriptions New Prescriptions   BENZONATATE (TESSALON) 100 MG CAPSULE    Take 2 capsules (200 mg total) by mouth 2 (two) times daily as needed for cough.     Montine Circle, PA-C 10/13/17 0211    Ward, Delice Bison, DO 10/13/17 [phone]

## 2017-10-13 NOTE — ED Notes (Signed)
Daughters are translating at bedside.

## 2018-02-18 ENCOUNTER — Emergency Department (HOSPITAL_COMMUNITY)
Admission: EM | Admit: 2018-02-18 | Discharge: 2018-02-19 | Disposition: A | Discharge status: Home / Self Care | Payer: Medicaid Other | Attending: Emergency Medicine | Admitting: Emergency Medicine

## 2018-02-18 ENCOUNTER — Encounter (HOSPITAL_COMMUNITY): Payer: Self-pay

## 2018-02-18 ENCOUNTER — Emergency Department (HOSPITAL_COMMUNITY): Payer: Medicaid Other

## 2018-02-18 DIAGNOSIS — M545 Low back pain, unspecified: Secondary | ICD-10-CM

## 2018-02-18 DIAGNOSIS — I10 Essential (primary) hypertension: Secondary | ICD-10-CM | POA: Diagnosis not present

## 2018-02-18 DIAGNOSIS — Z79899 Other long term (current) drug therapy: Secondary | ICD-10-CM | POA: Diagnosis not present

## 2018-02-18 DIAGNOSIS — E119 Type 2 diabetes mellitus without complications: Secondary | ICD-10-CM | POA: Insufficient documentation

## 2018-02-18 DIAGNOSIS — Z87891 Personal history of nicotine dependence: Secondary | ICD-10-CM | POA: Insufficient documentation

## 2018-02-18 MED ORDER — TRAMADOL HCL 50 MG PO TABS
50.0000 mg | ORAL_TABLET | Freq: Once | ORAL | Status: AC
  Administered 2018-02-18: 50 mg via ORAL
  Filled 2018-02-18: qty 1

## 2018-02-18 NOTE — ED Triage Notes (Signed)
Pt states that he had back surgery two years ago. Over the past several weeks back pain is worse. Denies new injury or falls

## 2018-02-18 NOTE — ED Provider Notes (Signed)
[facility] EMERGENCY DEPARTMENT Provider Note   CSN: [id number] Arrival date & time: 02/18/18  2056     History   Chief Complaint Chief Complaint  Patient presents with  . Back Pain    HPI Jorge [name] is a 67 y.o. male.  HPI   67 year old male with history of diabetes, arthritis, hypertension presenting complaining of back pain.  Patient speaks [place], son at bedside serves as interpreter.  Patient understand that we have language interpreter available but prefers son to translate.  Patient has back surgery a year ago by Dr. Cyndy Freeze.  He has had recurrent pain to his lower back.  Past 2 weeks he has noticed increasing pain to his back that occasionally radiates down to his legs.  He also complains of body aches.  He has been taking tramadol at home without adequate relief.  His pain is worse with ambulation and improves with rest.  Described pain as sharp and achy.  No report of fever, lightheadedness, dizziness, urinary symptoms, blood in urine, bowel bladder changes, or leg weakness or numbness.  Does have recurrent abdominal pain that radiates to his chest but this is not new.  He is currently taking Carafate, and other GI related medication.  He has been seen and evaluated for his abdominal pain multiple times the past and was thought to be related to reflux.  His primary complaint today is his back pain.  No recent injury.  No prior history of IV drug use or active cancer.  He is not a smoker or drinker.  Past Medical History:  Diagnosis Date  . Acid reflux   . Arthritis    right leg  . Cataract    bil removed  . Diabetes mellitus without complication ([place])   . Hypertension     Patient Active Problem List   Diagnosis Date Noted  . Abdominal pain, epigastric 05/09/2017  . Lumbosacral spondylosis with radiculopathy 12/25/2016    Past Surgical History:  Procedure Laterality Date  . cataracts Bilateral   . LUMBAR LAMINECTOMY/DECOMPRESSION MICRODISCECTOMY  Right 12/25/2016   Procedure: Lumbar three-five Laminectomy, Right Lumbar three-four, Lumbar four-five Diskectomy ;  Surgeon: Kevan Ny Ditty, MD;  Location: [facility];  Service: Neurosurgery;  Laterality: Right;       Home Medications    Prior to Admission medications   Medication Sig Start Date End Date Taking? Authorizing Provider  amLODipine (NORVASC) 5 MG tablet Take 5 mg by mouth daily. 07/16/16   [provider]  atorvastatin (LIPITOR) 40 MG tablet Take 40 mg by mouth daily at 6 PM.    [provider]  benzonatate (TESSALON) 100 MG capsule Take 2 capsules (200 mg total) by mouth 2 (two) times daily as needed for cough. 10/13/17   Montine Circle, PA-C  cephALEXin (KEFLEX) 500 MG capsule Take 1 capsule (500 mg total) by mouth 3 (three) times daily. 09/25/17   Jola Schmidt, MD  dicyclomine (BENTYL) 20 MG tablet Take 1 tablet (20 mg total) by mouth 2 (two) times daily. Patient not taking: Reported on 09/05/2017 05/01/17   Frederica Kuster, PA-C  famotidine (PEPCID) 20 MG tablet Take 1 tablet (20 mg total) by mouth 2 (two) times daily. 09/05/17   Kirichenko, Tatyana, PA-C  gabapentin (NEURONTIN) 100 MG capsule Take 100 mg by mouth 2 (two) times daily. 08/23/17   [provider]  omeprazole (PRILOSEC) 20 MG capsule Take 1 capsule (20 mg total) by mouth daily. 09/05/17   Jeannett Senior, PA-C  pantoprazole (PROTONIX) 40 MG tablet Take 1 tablet (40 mg total) by mouth 2 (two) times daily. Patient not taking: Reported on 09/05/2017 06/16/17   Horton, Barbette Hair, MD  polyethylene glycol powder (MIRALAX) powder Take 17 g by mouth daily. 09/25/17   Jola Schmidt, MD  sucralfate (CARAFATE) 1 g tablet Take 1 tablet (1 g total) by mouth 4 (four) times daily -  with meals and at bedtime. 09/05/17   Kirichenko, Tatyana, PA-C  traMADol (ULTRAM) 50 MG tablet Take 1 tablet (50 mg total) by mouth 2 (two) times daily as needed. 10/13/17   Montine Circle, PA-C    Family History Family  History  Problem Relation Age of Onset  . Colon cancer Neg Hx   . Esophageal cancer Neg Hx   . Pancreatic cancer Neg Hx   . Prostate cancer Neg Hx   . Rectal cancer Neg Hx   . Stomach cancer Neg Hx     Social History Social History   Tobacco Use  . Smoking status: Former Smoker    Packs/day: 0.25    Years: 40.00    Pack years: 10.00    Last attempt to quit: 12/17/2011    Years since quitting: 6.1  . Smokeless tobacco: Never Used  Substance Use Topics  . Alcohol use: No  . Drug use: No     Allergies   Patient has no known allergies.   Review of Systems Review of Systems  All other systems reviewed and are negative.    Physical Exam Updated Vital Signs BP (!) 141/81   Pulse (!) 59   Temp 98.7 F (37.1 C) (Oral)   Resp (!) 1   SpO2 100%   Physical Exam  Constitutional: He appears well-developed and well-nourished. No distress.  HENT:  Head: Atraumatic.  Eyes: Conjunctivae are normal.  Neck: Neck supple.  Cardiovascular: Normal rate and regular rhythm.  Pulmonary/Chest: Effort normal and breath sounds normal.  Abdominal: Soft. He exhibits no distension. There is tenderness (Mild epigastric tenderness without guarding or rebound tenderness).  Musculoskeletal: He exhibits tenderness (Tenderness along his lumbar spine without crepitus or overlying skin changes.  Normal appearing surgical scar.).  Neurological: He is alert.  5 out of 5 strength to bilateral lower extremities with intact distal pedal pulses.  Skin: No rash noted.  Psychiatric: He has a normal mood and affect.  Nursing note and vitals reviewed.    ED Treatments / Results  Labs (all labs ordered are listed, but only abnormal results are displayed) Labs Reviewed - No data to display  EKG  EKG Interpretation None       Radiology Dg Lumbar Spine Complete  Result Date: 02/18/2018 CLINICAL DATA:  Progressive back pain and of the past several weeks. History of back surgery 2 years ago.  EXAM: LUMBAR SPINE - COMPLETE 4+ VIEW COMPARISON:  CT abdomen and pelvis from 09/25/2017 FINDINGS: Normal lumbar segmentation. L4 and L5 laminectomy defects are noted. Chronic L3 spinous process fracture with sclerotic appearing margins about the fracture lucency. Mild physiologic wedging at L1. No acute compression fracture. No listhesis. No significant disc space flattening. IMPRESSION: 1. No acute osseous abnormality. 2. L4 and L5 laminectomy defects with chronic L3 spinous process fracture. Electronically Signed   By: Ashley Royalty M.D.   On: 02/18/2018 23:18    Procedures Procedures (including critical care time)  Medications Ordered in ED Medications - No data to display   Initial Impression / Assessment and Plan / ED Course  I have  reviewed the triage vital signs and the nursing notes.  Pertinent labs & imaging results that were available during my care of the patient were reviewed by me and considered in my medical decision making (see chart for details).     BP (!) 141/81   Pulse (!) 59   Temp 98.7 F (37.1 C) (Oral)   Resp 16   SpO2 100%    Final Clinical Impressions(s) / ED Diagnoses   Final diagnoses:  Recurrent low back pain    ED Discharge Orders        Ordered    traMADol (ULTRAM) 50 MG tablet  2 times daily PRN     02/19/18 0022    cyclobenzaprine (FLEXERIL) 10 MG tablet  2 times daily PRN     02/19/18 0022     Patient with prior back surgery here with neck pain and associated radicular leg pain.  Given his age, will obtain screening x-ray.  Tramadol given.  Symptom has been ongoing for the past 2 weeks.  I have low suspicion for cardiopulmonary etiology causing his symptoms, low suspicion for dissection, and also low suspicion for abdominal pathology.  12:16 AM Prior L4 and L5 laminectomy, chronic L3 spinous process fracture.  X-ray without acute fractures or dislocation.  Patient is able to ambulate.  Given his recurrent pain, he will be discharged home with  pain medication but I encourage patient to follow-up closely with his specialist for further management.  Return precautions discussed. In order to decrease risk of narcotic abuse. Pt's record were checked using the [place] Controlled Substance database. Pt is at moderate risk of overdose.  Recommend considering alternatives for future back pain.      Domenic Moras, PA-C 02/19/18 0023    Tegeler, Gwenyth Allegra, MD 02/19/18 [phone]

## 2018-02-19 MED ORDER — TRAMADOL HCL 50 MG PO TABS: 50.0000 mg | ORAL_TABLET | Freq: Two times a day (BID) | ORAL | 0 refills | Status: DC | PRN

## 2018-02-19 MED ORDER — CYCLOBENZAPRINE HCL 10 MG PO TABS: 10.0000 mg | ORAL_TABLET | Freq: Two times a day (BID) | ORAL | 0 refills | Status: DC | PRN

## 2018-02-19 NOTE — Discharge Instructions (Signed)
Please follow up with your doctor or with your orthopedic surgeon for further evaluation and management of your back pain.  Take tylenol as needed for your back.  Take tramadol if the pain is severe.  Muscle relaxant may help with your pain at night time.

## 2019-01-11 ENCOUNTER — Other Ambulatory Visit: Payer: Self-pay

## 2019-01-11 ENCOUNTER — Emergency Department (HOSPITAL_COMMUNITY)
Admission: EM | Admit: 2019-01-11 | Discharge: 2019-01-12 | Disposition: A | Discharge status: Home / Self Care | Payer: Medicaid Other | Attending: Emergency Medicine | Admitting: Emergency Medicine

## 2019-01-11 ENCOUNTER — Encounter (HOSPITAL_COMMUNITY): Payer: Self-pay | Admitting: Emergency Medicine

## 2019-01-11 DIAGNOSIS — R109 Unspecified abdominal pain: Secondary | ICD-10-CM | POA: Insufficient documentation

## 2019-01-11 DIAGNOSIS — Z5321 Procedure and treatment not carried out due to patient leaving prior to being seen by health care provider: Secondary | ICD-10-CM | POA: Insufficient documentation

## 2019-01-11 LAB — COMPREHENSIVE METABOLIC PANEL
ALT: 17 U/L (ref 0–44)
AST: 22 U/L (ref 15–41)
Albumin: 4.2 g/dL (ref 3.5–5.0)
Alkaline Phosphatase: 97 U/L (ref 38–126)
Anion gap: 8 (ref 5–15)
BUN: <5 mg/dL — ABNORMAL LOW (ref 8–23)
CO2: 25 mmol/L (ref 22–32)
Calcium: 9.1 mg/dL (ref 8.9–10.3)
Chloride: 108 mmol/L (ref 98–111)
Creatinine, Ser: 0.81 mg/dL (ref 0.61–1.24)
GFR calc Af Amer: >60 mL/min (ref >60)
GFR calc non Af Amer: >60 mL/min (ref >60)
Glucose, Bld: 97 mg/dL (ref 70–99)
Potassium: 3.3 mmol/L — ABNORMAL LOW (ref 3.5–5.1)
Sodium: 141 mmol/L (ref 135–145)
Total Bilirubin: 1.8 mg/dL — ABNORMAL HIGH (ref 0.3–1.2)
Total Protein: 7 g/dL (ref 6.5–8.1)

## 2019-01-11 LAB — CBC
HCT: 39.4 % (ref 39.0–52.0)
Hemoglobin: 13.9 g/dL (ref 13.0–17.0)
MCH: 31.1 pg (ref 26.0–34.0)
MCHC: 35.3 g/dL (ref 30.0–36.0)
MCV: 88.1 fL (ref 80.0–100.0)
NRBC: 0 % (ref 0.0–0.2)
Platelets: 128 10*3/uL — ABNORMAL LOW (ref 150–400)
RBC: 4.47 MIL/uL (ref 4.22–5.81)
RDW: 12.1 % (ref 11.5–15.5)
WBC: 3.9 10*3/uL — ABNORMAL LOW (ref 4.0–10.5)

## 2019-01-11 LAB — URINALYSIS, ROUTINE W REFLEX MICROSCOPIC
Bilirubin Urine: NEGATIVE
Glucose, UA: NEGATIVE mg/dL
Hgb urine dipstick: NEGATIVE
Ketones, ur: NEGATIVE mg/dL
Leukocytes, UA: NEGATIVE
Nitrite: NEGATIVE
Protein, ur: NEGATIVE mg/dL
Specific Gravity, Urine: 1.002 — ABNORMAL LOW (ref 1.005–1.030)
pH: 8 (ref 5.0–8.0)

## 2019-01-11 LAB — LIPASE, BLOOD: Lipase: 25 U/L (ref 11–51)

## 2019-01-11 MED ORDER — SODIUM CHLORIDE 0.9% FLUSH: 3.0000 mL | Freq: Once | INTRAVENOUS | Status: DC

## 2019-01-11 NOTE — ED Triage Notes (Signed)
Pt c/o abdominal pain, denies nausea/vomiting/diarrhea.

## 2019-01-12 NOTE — ED Notes (Signed)
No answer in waiting room 

## 2019-01-12 NOTE — ED Notes (Signed)
No answer for treatment room. 

## 2019-12-13 ENCOUNTER — Encounter (HOSPITAL_COMMUNITY): Payer: Self-pay | Admitting: Emergency Medicine

## 2019-12-13 ENCOUNTER — Other Ambulatory Visit: Payer: Self-pay

## 2019-12-13 ENCOUNTER — Emergency Department (HOSPITAL_COMMUNITY): Payer: Medicare Other

## 2019-12-13 ENCOUNTER — Encounter (HOSPITAL_COMMUNITY): Payer: Self-pay | Admitting: Family Medicine

## 2019-12-13 ENCOUNTER — Emergency Department (HOSPITAL_COMMUNITY)
Admission: EM | Admit: 2019-12-13 | Discharge: 2019-12-13 | Disposition: A | Discharge status: Home / Self Care | Payer: Medicare Other | Attending: Emergency Medicine | Admitting: Emergency Medicine

## 2019-12-13 DIAGNOSIS — I10 Essential (primary) hypertension: Secondary | ICD-10-CM | POA: Insufficient documentation

## 2019-12-13 DIAGNOSIS — E119 Type 2 diabetes mellitus without complications: Secondary | ICD-10-CM | POA: Insufficient documentation

## 2019-12-13 DIAGNOSIS — K219 Gastro-esophageal reflux disease without esophagitis: Secondary | ICD-10-CM | POA: Diagnosis not present

## 2019-12-13 DIAGNOSIS — Z87891 Personal history of nicotine dependence: Secondary | ICD-10-CM | POA: Insufficient documentation

## 2019-12-13 DIAGNOSIS — R1011 Right upper quadrant pain: Secondary | ICD-10-CM | POA: Insufficient documentation

## 2019-12-13 DIAGNOSIS — Z20828 Contact with and (suspected) exposure to other viral communicable diseases: Secondary | ICD-10-CM | POA: Insufficient documentation

## 2019-12-13 DIAGNOSIS — Z79899 Other long term (current) drug therapy: Secondary | ICD-10-CM | POA: Diagnosis not present

## 2019-12-13 LAB — COMPREHENSIVE METABOLIC PANEL
ALT: 23 U/L (ref 0–44)
AST: 22 U/L (ref 15–41)
Albumin: 4 g/dL (ref 3.5–5.0)
Alkaline Phosphatase: 98 U/L (ref 38–126)
Anion gap: 11 (ref 5–15)
BUN: 6 mg/dL — ABNORMAL LOW (ref 8–23)
CO2: 22 mmol/L (ref 22–32)
Calcium: 9.1 mg/dL (ref 8.9–10.3)
Chloride: 105 mmol/L (ref 98–111)
Creatinine, Ser: 0.77 mg/dL (ref 0.61–1.24)
GFR calc Af Amer: >60 mL/min (ref >60)
GFR calc non Af Amer: >60 mL/min (ref >60)
Glucose, Bld: 95 mg/dL (ref 70–99)
Potassium: 3.8 mmol/L (ref 3.5–5.1)
Sodium: 138 mmol/L (ref 135–145)
Total Bilirubin: 0.9 mg/dL (ref 0.3–1.2)
Total Protein: 7.1 g/dL (ref 6.5–8.1)

## 2019-12-13 LAB — URINALYSIS, ROUTINE W REFLEX MICROSCOPIC
Bilirubin Urine: NEGATIVE
Glucose, UA: NEGATIVE mg/dL
Hgb urine dipstick: NEGATIVE
Ketones, ur: NEGATIVE mg/dL
Leukocytes,Ua: NEGATIVE
Nitrite: NEGATIVE
Protein, ur: NEGATIVE mg/dL
Specific Gravity, Urine: 1.01 (ref 1.005–1.030)
pH: 6 (ref 5.0–8.0)

## 2019-12-13 LAB — CBC
HCT: 45.5 % (ref 39.0–52.0)
Hemoglobin: 15.8 g/dL (ref 13.0–17.0)
MCH: 31 pg (ref 26.0–34.0)
MCHC: 34.7 g/dL (ref 30.0–36.0)
MCV: 89.2 fL (ref 80.0–100.0)
Platelets: 181 10*3/uL (ref 150–400)
RBC: 5.1 MIL/uL (ref 4.22–5.81)
RDW: 12.1 % (ref 11.5–15.5)
WBC: 5.2 10*3/uL (ref 4.0–10.5)
nRBC: 0 % (ref 0.0–0.2)

## 2019-12-13 LAB — TROPONIN I (HIGH SENSITIVITY)
Troponin I (High Sensitivity): 10 ng/L (ref <18)
Troponin I (High Sensitivity): 6 ng/L (ref <18)

## 2019-12-13 LAB — LIPASE, BLOOD: Lipase: 20 U/L (ref 11–51)

## 2019-12-13 LAB — D-DIMER, QUANTITATIVE: D-Dimer, Quant: 0.39 ug/mL-FEU (ref 0.00–0.50)

## 2019-12-13 MED ORDER — ALUM & MAG HYDROXIDE-SIMETH 200-200-20 MG/5ML PO SUSP
30.0000 mL | Freq: Once | ORAL | Status: AC
  Administered 2019-12-13: 30 mL via ORAL
  Filled 2019-12-13: qty 30

## 2019-12-13 MED ORDER — SODIUM CHLORIDE 0.9% FLUSH: 3.0000 mL | Freq: Once | INTRAVENOUS | Status: DC

## 2019-12-13 MED ORDER — OXYCODONE-ACETAMINOPHEN 5-325 MG PO TABS
1.0000 | ORAL_TABLET | Freq: Once | ORAL | Status: AC
  Administered 2019-12-13: 1 via ORAL
  Filled 2019-12-13: qty 1

## 2019-12-13 MED ORDER — SUCRALFATE 1 G PO TABS: 1.0000 g | ORAL_TABLET | Freq: Four times a day (QID) | ORAL | 1 refills | Status: DC

## 2019-12-13 NOTE — ED Triage Notes (Signed)
Pt speaks [place], he has been having right sided abdominal pain that moves up into his chest and back X1 week.

## 2019-12-13 NOTE — ED Notes (Signed)
Patient Alert and oriented to baseline. Stable and ambulatory to baseline. Patient verbalized understanding of the discharge instructions.  Patient belongings were taken by the patient.   

## 2019-12-13 NOTE — ED Provider Notes (Signed)
[name] Provider Note   CSN: [id number] Arrival date & time: 12/13/19  [id number]     History Chief Complaint  Patient presents with  . Abdominal Pain  . Chest Pain    Jorge [name] is a 68 y.o. male.  Due to language barrier, an interpreter was present during the history-taking and subsequent discussion (and for part of the physical exam) with this patient.  Patient has a PMH  Acid reflux, Arthritis, Cataract, Diabetes mellitus without complication (Jorge [name]), and Hypertension.  Patient reports that he has had right upper quadrant abdominal pain for the past 6 days.  He has a history of GERD.  Describes this as a burning pain.  Says this is worse than normal.  Patient takes PPI and H2 blocker.  Patient says that this pain radiates to his back.  Patient also endorses bilateral thigh pain that has been present since March timeframe.  Patient does endorse some shortness of breath occasionally.  Patient also endorses some left calf pain that started last night. Patient denies any cough, nausea, vomiting, fevers, chills, chest pain, melena, hematochezia, dysuria, hematuria, constipation, urine or fecal incontinence, lower extremity weakness. Does endorse dizziness for the past 2 weeks..  Patient has had CT abdominal pelvis in 2018 that was normal.   The history is provided by the patient. The history is limited by a language barrier. A language interpreter was used.  Abdominal Pain Associated symptoms: chest pain   Chest Pain Associated symptoms: abdominal pain        Past Medical History:  Diagnosis Date  . Acid reflux   . Arthritis    right leg  . Cataract    bil removed  . Diabetes mellitus without complication (Jorge [name])   . Hypertension     Patient Active Problem List   Diagnosis Date Noted  . Abdominal pain, epigastric 05/09/2017  . Lumbosacral spondylosis with radiculopathy 12/25/2016    Past Surgical History:  Procedure Laterality Date  .  cataracts Bilateral   . LUMBAR LAMINECTOMY/DECOMPRESSION MICRODISCECTOMY Right 12/25/2016   Procedure: Lumbar three-five Laminectomy, Right Lumbar three-four, Lumbar four-five Diskectomy ;  Surgeon: Kevan Ny Ditty, MD;  Location: [facility];  Service: Neurosurgery;  Laterality: Right;       Family History  Problem Relation Age of Onset  . Colon cancer Neg Hx   . Esophageal cancer Neg Hx   . Pancreatic cancer Neg Hx   . Prostate cancer Neg Hx   . Rectal cancer Neg Hx   . Stomach cancer Neg Hx     Social History   Tobacco Use  . Smoking status: Former Smoker    Packs/day: 0.25    Years: 40.00    Pack years: 10.00    Quit date: 12/17/2011    Years since quitting: 7.9  . Smokeless tobacco: Never Used  Substance Use Topics  . Alcohol use: No  . Drug use: No    Home Medications Prior to Admission medications   Medication Sig Start Date End Date Taking? Authorizing Provider  amLODipine (NORVASC) 5 MG tablet Take 5 mg by mouth daily. 07/16/16   [provider]  atorvastatin (LIPITOR) 40 MG tablet Take 40 mg by mouth daily at 6 PM.    [provider]  benzonatate (TESSALON) 100 MG capsule Take 2 capsules (200 mg total) by mouth 2 (two) times daily as needed for cough. 10/13/17   Montine Circle, PA-C  cephALEXin (KEFLEX) 500 MG capsule Take 1 capsule (500  mg total) by mouth 3 (three) times daily. 09/25/17   Jola Schmidt, MD  cyclobenzaprine (FLEXERIL) 10 MG tablet Take 1 tablet (10 mg total) by mouth 2 (two) times daily as needed for muscle spasms. 02/19/18   Domenic Moras, PA-C  dicyclomine (BENTYL) 20 MG tablet Take 1 tablet (20 mg total) by mouth 2 (two) times daily. Patient not taking: Reported on 09/05/2017 05/01/17   Frederica Kuster, PA-C  famotidine (PEPCID) 20 MG tablet Take 1 tablet (20 mg total) by mouth 2 (two) times daily. 09/05/17   Kirichenko, Tatyana, PA-C  gabapentin (NEURONTIN) 100 MG capsule Take 100 mg by mouth 2 (two) times daily. 08/23/17   [provider]  omeprazole (PRILOSEC) 20 MG capsule Take 1 capsule (20 mg total) by mouth daily. 09/05/17   Kirichenko, Tatyana, PA-C  pantoprazole (PROTONIX) 40 MG tablet Take 1 tablet (40 mg total) by mouth 2 (two) times daily. Patient not taking: Reported on 09/05/2017 06/16/17   Horton, Barbette Hair, MD  polyethylene glycol powder (MIRALAX) powder Take 17 g by mouth daily. 09/25/17   Jola Schmidt, MD  sucralfate (CARAFATE) 1 g tablet Take 1 tablet (1 g total) by mouth 4 (four) times daily. 12/13/19 02/11/20  Bonnita Hollow, MD  traMADol (ULTRAM) 50 MG tablet Take 1 tablet (50 mg total) by mouth 2 (two) times daily as needed for moderate pain or severe pain. 02/19/18   Domenic Moras, PA-C    Allergies    Patient has no known allergies.  Review of Systems   Review of Systems  Cardiovascular: Positive for chest pain.  Gastrointestinal: Positive for abdominal pain.  Remainder as per HPI  Physical Exam Updated Vital Signs BP (!) 152/84   Pulse (!) 49   Temp 97.7 F (36.5 C) (Oral)   Resp 18   Ht 5\' 1"  (1.549 m)   SpO2 94%   BMI 31.18 kg/m   Physical Exam Constitutional:      General: He is not in acute distress. HENT:     Head: Normocephalic and atraumatic.     Mouth/Throat:     Mouth: Mucous membranes are moist.     Pharynx: Oropharynx is clear.  Eyes:     Extraocular Movements: Extraocular movements intact.     Pupils: Pupils are equal, round, and reactive to light.  Cardiovascular:     Rate and Rhythm: Normal rate and regular rhythm.     Heart sounds: Normal heart sounds.  Pulmonary:     Effort: Pulmonary effort is normal.     Breath sounds: Normal breath sounds.  Abdominal:     General: Abdomen is flat. There is no distension.     Tenderness: There is abdominal tenderness in the right upper quadrant.     Hernia: No hernia is present.  Musculoskeletal:     Right lower leg: Tenderness present. No swelling.  Skin:    General: Skin is warm and dry.     Capillary Refill:  Capillary refill takes less than 2 seconds.  Neurological:     General: No focal deficit present.     Mental Status: He is alert and oriented to person, place, and time.     Cranial Nerves: No cranial nerve deficit.     Motor: No weakness.  Psychiatric:        Mood and Affect: Mood normal.        Behavior: Behavior normal.    Patient is actively coughing in the room. ED Results / Procedures /  Treatments   Labs (all labs ordered are listed, but only abnormal results are displayed) Labs Reviewed  COMPREHENSIVE METABOLIC PANEL - Abnormal; Notable for the following components:      Result Value   BUN 6 (*)    All other components within normal limits  NOVEL CORONAVIRUS, NAA (HOSP ORDER, SEND-OUT TO REF LAB; TAT 18-24 HRS)  LIPASE, BLOOD  CBC  URINALYSIS, ROUTINE W REFLEX MICROSCOPIC  D-DIMER, QUANTITATIVE (NOT AT [facility])  TROPONIN I (HIGH SENSITIVITY)  TROPONIN I (HIGH SENSITIVITY)    EKG EKG Interpretation  Date/Time:  Wednesday December 13 2019 06:11:07 EST Ventricular Rate:  52 PR Interval:  172 QRS Duration: 88 QT Interval:  422 QTC Calculation: 392 R Axis:   72 Text Interpretation: Sinus bradycardia Otherwise normal ECG Confirmed by Ripley Fraise [phone]) on 12/13/2019 6:22:05 AM   Radiology DG Chest 2 View  Result Date: 12/13/2019 CLINICAL DATA:  Chest pain EXAM: CHEST - 2 VIEW COMPARISON:  09/01/2011 FINDINGS: The cardiac silhouette, mediastinal and hilar contours are within normal limits. The lungs are clear. No worrisome pulmonary lesions. No pleural effusion. The bony thorax is intact. IMPRESSION: No acute cardiopulmonary findings. Electronically Signed   By: Marijo Sanes M.D.   On: 12/13/2019 09:59   US Abdomen Limited RUQ  Result Date: 12/13/2019 CLINICAL DATA:  Upper abdominal pain EXAM: ULTRASOUND ABDOMEN LIMITED RIGHT UPPER QUADRANT COMPARISON:  Abdominal ultrasound August 18, 2017 FINDINGS: Gallbladder: No gallstones or wall thickening visualized. There  is no pericholecystic fluid. No sonographic Murphy sign noted by sonographer. Common bile duct: Diameter: 3 mm. No intrahepatic or extrahepatic biliary duct dilatation. Liver: No focal lesion identified. Liver echogenicity overall increased. Portal vein is patent on color Doppler imaging with normal direction of blood flow towards the liver. Other: None. IMPRESSION: 1. Liver echogenicity overall is increased, a finding most likely indicative of hepatic steatosis. Previously noted small cyst in the left lobe of the liver is not seen currently. While no focal liver lesions are evident on this study, it must be cautioned that the sensitivity of ultrasound for detection of focal liver lesions is somewhat diminished in this circumstance. 2.  Study otherwise unremarkable. Electronically Signed   By: Lowella Grip III M.D.   On: 12/13/2019 15:08    Procedures Procedures (including critical care time)  Medications Ordered in ED Medications  sodium chloride flush (NS) 0.9 % injection 3 mL (has no administration in time range)  alum & mag hydroxide-simeth (MAALOX/MYLANTA) 200-200-20 MG/5ML suspension 30 mL (30 mLs Oral Given 12/13/19 1014)  oxyCODONE-acetaminophen (PERCOCET/ROXICET) 5-325 MG per tablet 1 tablet (1 tablet Oral Given 12/13/19 1157)    ED Course  I have reviewed the triage vital signs and the nursing notes.  Pertinent labs & imaging results that were available during my care of the patient were reviewed by me and considered in my medical decision making (see chart for details).  Clinical Course as of Dec 12 1544  Wed Dec 13, 2019  1044 ALT: 23 [AT]  1158 Anion gap: 11 [AT]    Clinical Course User Index [AT] Bonnita Hollow, MD   MDM Rules/Calculators/A&P                      Patient with GERD presents with approximately 1 week of right upper quadrant abdominal pain that radiates to his back.  States that the pain is like a burning and feels like his prior abdominal pain but is  worse.  Also endorses  some chest pain and shortness of breath.  Also endorses left calf pain.  Patient is mildly tender on right upper quadrant and left lower calf.  Remainder of exam is unremarkable. VSS and saturationg well on RA.  Patient labs have been reassuring including negative CBC, CMP, troponin x1, lipase, UA.  EKG shows sinus bradycardia with no ST changes.  Chest x-ray pending.  Will get right upper quadrant ultrasound to rule out gallstones, but this is less likely given negative LFTs.  Given chest pain and left calf pain.  Will get D-dimer.  Low suspicion for PE given patient is not tachycardic and saturating well on room air.  Will trial GI cocktail for pain.  Although patient denies coughing, he is coughing in the room.  Will get COVID-19 sent out.  Update CXR negative, D-dimer negative  Update Right upper quadrant ultrasound was negative for gallstones or acute findings suggestive of presentation.  Pain is likely due to patient chronic GERD.  Will treat patient with Carafate on top of his PPI and H2 blocker.  Recommend patient follow-up with GI.  Given patient's cough, he has been tested for COVID-19.  Self isolation precautions discussed with patient per CDC recommendations.   Final Clinical Impression(s) / ED Diagnoses Final diagnoses:  RUQ abdominal pain  Gastroesophageal reflux disease, unspecified whether esophagitis present    Rx / DC Orders ED Discharge Orders         Ordered    sucralfate (CARAFATE) 1 g tablet  4 times daily     12/13/19 1531           Bonnita Hollow, MD 12/13/19 1545    Blanchie Dessert, MD 12/14/19 2150

## 2019-12-14 LAB — NOVEL CORONAVIRUS, NAA (HOSP ORDER, SEND-OUT TO REF LAB; TAT 18-24 HRS): SARS-CoV-2, NAA: NOT DETECTED

## 2020-02-27 ENCOUNTER — Other Ambulatory Visit: Payer: Self-pay

## 2020-02-27 ENCOUNTER — Emergency Department (HOSPITAL_COMMUNITY)
Admission: EM | Admit: 2020-02-27 | Discharge: 2020-02-27 | Disposition: A | Discharge status: Home / Self Care | Payer: Medicare Other | Attending: Emergency Medicine | Admitting: Emergency Medicine

## 2020-02-27 ENCOUNTER — Emergency Department (HOSPITAL_COMMUNITY): Payer: Medicare Other

## 2020-02-27 ENCOUNTER — Encounter (HOSPITAL_COMMUNITY): Payer: Self-pay | Admitting: Emergency Medicine

## 2020-02-27 DIAGNOSIS — R1011 Right upper quadrant pain: Secondary | ICD-10-CM | POA: Diagnosis present

## 2020-02-27 DIAGNOSIS — Z79899 Other long term (current) drug therapy: Secondary | ICD-10-CM | POA: Diagnosis not present

## 2020-02-27 DIAGNOSIS — Z87891 Personal history of nicotine dependence: Secondary | ICD-10-CM | POA: Diagnosis not present

## 2020-02-27 DIAGNOSIS — I10 Essential (primary) hypertension: Secondary | ICD-10-CM | POA: Insufficient documentation

## 2020-02-27 DIAGNOSIS — E119 Type 2 diabetes mellitus without complications: Secondary | ICD-10-CM | POA: Diagnosis not present

## 2020-02-27 LAB — CBC
HCT: 45.2 % (ref 39.0–52.0)
Hemoglobin: 15.8 g/dL (ref 13.0–17.0)
MCH: 31 pg (ref 26.0–34.0)
MCHC: 35 g/dL (ref 30.0–36.0)
MCV: 88.6 fL (ref 80.0–100.0)
Platelets: 150 10*3/uL (ref 150–400)
RBC: 5.1 MIL/uL (ref 4.22–5.81)
RDW: 11.8 % (ref 11.5–15.5)
WBC: 4.4 10*3/uL (ref 4.0–10.5)
nRBC: 0 % (ref 0.0–0.2)

## 2020-02-27 LAB — COMPREHENSIVE METABOLIC PANEL
ALT: 43 U/L (ref 0–44)
AST: 31 U/L (ref 15–41)
Albumin: 4.1 g/dL (ref 3.5–5.0)
Alkaline Phosphatase: 94 U/L (ref 38–126)
Anion gap: 10 (ref 5–15)
BUN: 7 mg/dL — ABNORMAL LOW (ref 8–23)
CO2: 22 mmol/L (ref 22–32)
Calcium: 9.1 mg/dL (ref 8.9–10.3)
Chloride: 109 mmol/L (ref 98–111)
Creatinine, Ser: 0.99 mg/dL (ref 0.61–1.24)
GFR calc Af Amer: >60 mL/min (ref >60)
GFR calc non Af Amer: >60 mL/min (ref >60)
Glucose, Bld: 137 mg/dL — ABNORMAL HIGH (ref 70–99)
Potassium: 3.2 mmol/L — ABNORMAL LOW (ref 3.5–5.1)
Sodium: 141 mmol/L (ref 135–145)
Total Bilirubin: 1.3 mg/dL — ABNORMAL HIGH (ref 0.3–1.2)
Total Protein: 6.8 g/dL (ref 6.5–8.1)

## 2020-02-27 LAB — URINALYSIS, ROUTINE W REFLEX MICROSCOPIC
Bilirubin Urine: NEGATIVE
Glucose, UA: NEGATIVE mg/dL
Hgb urine dipstick: NEGATIVE
Ketones, ur: NEGATIVE mg/dL
Leukocytes,Ua: NEGATIVE
Nitrite: NEGATIVE
Protein, ur: NEGATIVE mg/dL
Specific Gravity, Urine: 1.005 (ref 1.005–1.030)
pH: 6 (ref 5.0–8.0)

## 2020-02-27 LAB — LIPASE, BLOOD: Lipase: 21 U/L (ref 11–51)

## 2020-02-27 MED ORDER — SODIUM CHLORIDE 0.9% FLUSH: 3.0000 mL | Freq: Once | INTRAVENOUS | Status: DC

## 2020-02-27 MED ORDER — LIDOCAINE VISCOUS HCL 2 % MT SOLN
15.0000 mL | Freq: Once | OROMUCOSAL | Status: AC
  Administered 2020-02-27: 15 mL via ORAL
  Filled 2020-02-27: qty 15

## 2020-02-27 MED ORDER — ALUM & MAG HYDROXIDE-SIMETH 200-200-20 MG/5ML PO SUSP
30.0000 mL | Freq: Once | ORAL | Status: AC
  Administered 2020-02-27: 30 mL via ORAL
  Filled 2020-02-27: qty 30

## 2020-02-27 MED ORDER — ESOMEPRAZOLE MAGNESIUM 40 MG PO CPDR: 40.0000 mg | DELAYED_RELEASE_CAPSULE | Freq: Every day | ORAL | 0 refills | Status: DC

## 2020-02-27 NOTE — ED Notes (Signed)
Pt transported to US

## 2020-02-27 NOTE — ED Notes (Signed)
Pt d/c home per Order. [place] interpretor on [place] used to assist with reviewing discharge summary. Pt verbalizes understanding. Reports family is discharge ride home,. Off unit via WC, No s/s of acute distress noted.

## 2020-02-27 NOTE — ED Provider Notes (Addendum)
  Physical Exam  BP (!) 150/71   Pulse (!) 52   Temp 99 F (37.2 C) (Oral)   Resp (!) 21   SpO2 99%   Physical Exam  ED Course/Procedures   Clinical Course as of Feb 26 1702  Tue Feb 27, 2020  1655 Patient with chronic abdominal pain. Workup reassuring including negative RUQ ultrasound. Will start patient on protonix for presumed GERD. Needs PMD f/u. Discussed with patient using interpreter and all questions answered.    [KM]    Clinical Course User Index [KM] Alveria Apley, PA-C    Procedures  MDM  Care assumed from [facility] due to change of shift. Briefly, this is a 69 y/o gentleman presenting to the ER for multiple chronic complaints. His biggest complaint being RUQ abdominal pain. Previously diagnosed with gastritis, taking bentyl and Carafate. Labs reassuring awaiting RUQ ultrasound a this time,       Jorge [name] 02/27/20 1657    Alveria Apley, PA-C 02/27/20 1703    Margette Fast, MD 02/28/20 [phone]

## 2020-02-27 NOTE — ED Triage Notes (Addendum)
Per interpreter machine  Pt speaks [place]  Upper abd pain x 5 days no vomiting but diarrhea  Burning pain in feet and legs, cough x 1 month  Has been getting dizzy  A lot  hasnt taken any diabetes meds in 2 years, pt visiting son ffrom out of town , states hurts to void burns

## 2020-02-27 NOTE — Discharge Instructions (Addendum)

## 2020-02-27 NOTE — ED Provider Notes (Signed)
[facility] EMERGENCY DEPARTMENT Provider Note   CSN: [id number] Arrival date & time: 02/27/20  1114     History Chief Complaint  Patient presents with  . Abdominal Pain    Jorge [name] is a 69 y.o. male who presents emergency department with abdominal pain and bilateral lower extremity pain.  The patient speaks [place] and a Psychologist, sport and exercise is utilized.  Patient states that his stomach pain has been going on for 6 years it is all the time.  He states that it is worse when he does not eat and does not have much in his stomach.  He is avoiding hot spicy foods, he does not drink coffee or alcohol and does not use NSAIDs.  He was told that he has gastritis.  He has some nausea, burning up his esophagus.  He denies vomiting.  Pain does radiate into his shoulder sometimes.  He has occasional loose stools without change in color.  He has no history of abdominal surgeries.  Patient is currently taking Carafate.  He states it is not helping very much.  He says that he has had multiple work-ups in various states which is apparent in review of his EMR. Patient is also complaining of bilateral lower extremity numbness tingling and burning pain in a stocking distribution which has been present "since the onset of the Covid pandemic."  Nothing seems to make it worse or better.  He says that symptoms he tries cold water on his legs which does not help very much.  He has a history of diabetes and is not taking any medication at this time because he was told to discontinue it by his primary care physician.  He denies a history of alcohol abuse or radiation therapy. She also complains of occasional intermittent sudden onset room spinning dizziness and nausea lasting approximately 12 minutes at a time.  He states is worse when he turns his head to the left and has been going on for about a month.  When it occurs he has onset of ringing in his ears.  He denies any current neurologic  symptoms.  HPI     Past Medical History:  Diagnosis Date  . Acid reflux   . Arthritis    right leg  . Cataract    bil removed  . Diabetes mellitus without complication ([place])   . Hypertension     Patient Active Problem List   Diagnosis Date Noted  . Abdominal pain, epigastric 05/09/2017  . Lumbosacral spondylosis with radiculopathy 12/25/2016    Past Surgical History:  Procedure Laterality Date  . cataracts Bilateral   . LUMBAR LAMINECTOMY/DECOMPRESSION MICRODISCECTOMY Right 12/25/2016   Procedure: Lumbar three-five Laminectomy, Right Lumbar three-four, Lumbar four-five Diskectomy ;  Surgeon: Kevan Ny Ditty, MD;  Location: [facility];  Service: Neurosurgery;  Laterality: Right;       Family History  Problem Relation Age of Onset  . Colon cancer Neg Hx   . Esophageal cancer Neg Hx   . Pancreatic cancer Neg Hx   . Prostate cancer Neg Hx   . Rectal cancer Neg Hx   . Stomach cancer Neg Hx     Social History   Tobacco Use  . Smoking status: Former Smoker    Packs/day: 0.25    Years: 40.00    Pack years: 10.00    Quit date: 12/17/2011    Years since quitting: 8.2  . Smokeless tobacco: Never Used  Substance Use Topics  . Alcohol use: No  .  Drug use: No    Home Medications Prior to Admission medications   Medication Sig Start Date End Date Taking? Authorizing Provider  amLODipine (NORVASC) 5 MG tablet Take 5 mg by mouth daily. 07/16/16   [provider]  atorvastatin (LIPITOR) 40 MG tablet Take 40 mg by mouth daily at 6 PM.    [provider]  benzonatate (TESSALON) 100 MG capsule Take 2 capsules (200 mg total) by mouth 2 (two) times daily as needed for cough. 10/13/17   Montine Circle, PA-C  cephALEXin (KEFLEX) 500 MG capsule Take 1 capsule (500 mg total) by mouth 3 (three) times daily. 09/25/17   Jola Schmidt, MD  cyclobenzaprine (FLEXERIL) 10 MG tablet Take 1 tablet (10 mg total) by mouth 2 (two) times daily as needed for muscle spasms.  02/19/18   Domenic Moras, PA-C  dicyclomine (BENTYL) 20 MG tablet Take 1 tablet (20 mg total) by mouth 2 (two) times daily. Patient not taking: Reported on 09/05/2017 05/01/17   Frederica Kuster, PA-C  famotidine (PEPCID) 20 MG tablet Take 1 tablet (20 mg total) by mouth 2 (two) times daily. 09/05/17   Kirichenko, Tatyana, PA-C  gabapentin (NEURONTIN) 100 MG capsule Take 100 mg by mouth 2 (two) times daily. 08/23/17   [provider]  omeprazole (PRILOSEC) 20 MG capsule Take 1 capsule (20 mg total) by mouth daily. 09/05/17   Kirichenko, Tatyana, PA-C  pantoprazole (PROTONIX) 40 MG tablet Take 1 tablet (40 mg total) by mouth 2 (two) times daily. Patient not taking: Reported on 09/05/2017 06/16/17   Horton, Barbette Hair, MD  polyethylene glycol powder (MIRALAX) powder Take 17 g by mouth daily. 09/25/17   Jola Schmidt, MD  sucralfate (CARAFATE) 1 g tablet Take 1 tablet (1 g total) by mouth 4 (four) times daily. 12/13/19 02/11/20  Bonnita Hollow, MD  traMADol (ULTRAM) 50 MG tablet Take 1 tablet (50 mg total) by mouth 2 (two) times daily as needed for moderate pain or severe pain. 02/19/18   Domenic Moras, PA-C    Allergies    Patient has no known allergies.  Review of Systems   Review of Systems Ten systems reviewed and are negative for acute change, except as noted in the HPI.   Physical Exam Updated Vital Signs BP (!) 144/77   Pulse (!) 56   Temp 99 F (37.2 C) (Oral)   Resp 14   SpO2 98%   Physical Exam Vitals and nursing note reviewed.  Constitutional:      General: He is not in acute distress.    Appearance: He is well-developed. He is not diaphoretic.  HENT:     Head: Normocephalic and atraumatic.  Eyes:     General: No scleral icterus.    Conjunctiva/sclera: Conjunctivae normal.  Cardiovascular:     Rate and Rhythm: Normal rate and regular rhythm.     Heart sounds: Normal heart sounds.  Pulmonary:     Effort: Pulmonary effort is normal. No respiratory distress.     Breath  sounds: Normal breath sounds.  Abdominal:     Palpations: Abdomen is soft.     Tenderness: There is abdominal tenderness in the right upper quadrant.  Musculoskeletal:     Cervical back: Normal range of motion and neck supple.  Skin:    General: Skin is warm and dry.  Neurological:     Mental Status: He is alert.  Psychiatric:        Behavior: Behavior normal.     ED Results /  Procedures / Treatments   Labs (all labs ordered are listed, but only abnormal results are displayed) Labs Reviewed  COMPREHENSIVE METABOLIC PANEL - Abnormal; Notable for the following components:      Result Value   Potassium 3.2 (*)    Glucose, Bld 137 (*)    BUN 7 (*)    Total Bilirubin 1.3 (*)    All other components within normal limits  URINALYSIS, ROUTINE W REFLEX MICROSCOPIC - Abnormal; Notable for the following components:   Color, Urine STRAW (*)    All other components within normal limits  LIPASE, BLOOD  CBC    EKG EKG Interpretation  Date/Time:  Tuesday February 27 2020 11:29:25 EST Ventricular Rate:  58 PR Interval:  158 QRS Duration: 100 QT Interval:  410 QTC Calculation: 402 R Axis:   57 Text Interpretation: Sinus bradycardia Otherwise normal ECG no significant change since 2020 Confirmed by Sherwood Gambler [phone]) on 02/27/2020 1:11:26 PM   Radiology No results found.  Procedures Procedures (including critical care time)  Medications Ordered in ED Medications  sodium chloride flush (NS) 0.9 % injection 3 mL (has no administration in time range)  alum & mag hydroxide-simeth (MAALOX/MYLANTA) 200-200-20 MG/5ML suspension 30 mL (30 mLs Oral Given 02/27/20 1429)    And  lidocaine (XYLOCAINE) 2 % viscous mouth solution 15 mL (15 mLs Oral Given 02/27/20 1430)    ED Course  I have reviewed the triage vital signs and the nursing notes.  Pertinent labs & imaging results that were available during my care of the patient were reviewed by me and considered in my medical decision making  (see chart for details).    MDM Rules/Calculators/A&P                     This is a 69 year old male with complaint of abdominal pain.  Pain is epigastric right upper quadrant sometimes radiates to the shoulder.  Given affect I will go ahead and give the patient a right upper quadrant ultrasound however her symptoms seem similar to either ulcer or gastritis.  Patient has seen multiple people but may need to see a gastroenterologist here.The emergent DDX for RUQ pain includes but is not limited to Glabladder disease, PUD, Acute Hepatitis, Pancreatitis, pyelonephritis, Pneumonia, Lower lobe PE/Infarct, Kidney stone, GERD, retrocecal appendicitis, Fitz-Hugh-Curtis syndrome, AAA, MI, Zoster. I reviewed the patient's labs which show negative urinalysis.  CMP shows mild hypokalemia, elevated blood glucose at 137.  Patient's lipase is within normal limits.  CBC shows no abnormalities, CMP shows no elevation in his AST ALT or alk phos but slightly elevated bilirubin just above normal and likely insignificant.  I reviewed the patient's EKG which shows sinus bradycardia at a rate of 58 but otherwise normal ECG without any changes from previous tracing.  Patient is only taking dicyclomine and Carafate and may need to add in a PPI. Regarding the patient's room spinning dizziness his symptoms are very consistent with Mn[place]'s given the fact that it is intermittent and worse with turning his head to the left I feel fairly safe to say that this is peripheral and he has no symptoms at this time.  Lastly the patient has complaint of what sounds like peripheral neuropathy is likely a sequela of his diabetes.  I have advised that he will need to speak to his primary care physician about this.  Sign out given to PA [facility]    Final Clinical Impression(s) / ED Diagnoses Final diagnoses:  None  Rx / DC Orders ED Discharge Orders    None       Margarita Mail, PA-C 02/27/20 1531    Sherwood Gambler,  MD 03/02/20 [phone]

## 2020-02-27 NOTE — ED Notes (Signed)
Got patient undress into a gown on the monitor patient is resting with call bell in reach 

## 2020-03-01 ENCOUNTER — Emergency Department (HOSPITAL_COMMUNITY): Payer: Medicare Other

## 2020-03-01 ENCOUNTER — Other Ambulatory Visit: Payer: Self-pay

## 2020-03-01 ENCOUNTER — Encounter (HOSPITAL_COMMUNITY): Payer: Self-pay | Admitting: *Deleted

## 2020-03-01 ENCOUNTER — Emergency Department (HOSPITAL_COMMUNITY)
Admission: EM | Admit: 2020-03-01 | Discharge: 2020-03-01 | Disposition: A | Discharge status: Home / Self Care | Payer: Medicare Other | Attending: Emergency Medicine | Admitting: Emergency Medicine

## 2020-03-01 DIAGNOSIS — R42 Dizziness and giddiness: Secondary | ICD-10-CM | POA: Insufficient documentation

## 2020-03-01 DIAGNOSIS — R0789 Other chest pain: Secondary | ICD-10-CM | POA: Diagnosis present

## 2020-03-01 DIAGNOSIS — Z87891 Personal history of nicotine dependence: Secondary | ICD-10-CM | POA: Diagnosis not present

## 2020-03-01 DIAGNOSIS — R1013 Epigastric pain: Secondary | ICD-10-CM

## 2020-03-01 DIAGNOSIS — R0602 Shortness of breath: Secondary | ICD-10-CM | POA: Diagnosis not present

## 2020-03-01 DIAGNOSIS — E119 Type 2 diabetes mellitus without complications: Secondary | ICD-10-CM | POA: Diagnosis not present

## 2020-03-01 DIAGNOSIS — Z79899 Other long term (current) drug therapy: Secondary | ICD-10-CM | POA: Insufficient documentation

## 2020-03-01 DIAGNOSIS — I10 Essential (primary) hypertension: Secondary | ICD-10-CM | POA: Insufficient documentation

## 2020-03-01 LAB — BASIC METABOLIC PANEL
Anion gap: 10 (ref 5–15)
BUN: 5 mg/dL — ABNORMAL LOW (ref 8–23)
CO2: 23 mmol/L (ref 22–32)
Calcium: 9 mg/dL (ref 8.9–10.3)
Chloride: 108 mmol/L (ref 98–111)
Creatinine, Ser: 0.95 mg/dL (ref 0.61–1.24)
GFR calc Af Amer: >60 mL/min (ref >60)
GFR calc non Af Amer: >60 mL/min (ref >60)
Glucose, Bld: 151 mg/dL — ABNORMAL HIGH (ref 70–99)
Potassium: 3.1 mmol/L — ABNORMAL LOW (ref 3.5–5.1)
Sodium: 141 mmol/L (ref 135–145)

## 2020-03-01 LAB — CBC
HCT: 43.5 % (ref 39.0–52.0)
Hemoglobin: 15.1 g/dL (ref 13.0–17.0)
MCH: 31.1 pg (ref 26.0–34.0)
MCHC: 34.7 g/dL (ref 30.0–36.0)
MCV: 89.7 fL (ref 80.0–100.0)
Platelets: 150 10*3/uL (ref 150–400)
RBC: 4.85 MIL/uL (ref 4.22–5.81)
RDW: 11.9 % (ref 11.5–15.5)
WBC: 4.4 10*3/uL (ref 4.0–10.5)
nRBC: 0 % (ref 0.0–0.2)

## 2020-03-01 LAB — HEPATIC FUNCTION PANEL
ALT: 33 U/L (ref 0–44)
AST: 27 U/L (ref 15–41)
Albumin: 4 g/dL (ref 3.5–5.0)
Alkaline Phosphatase: 79 U/L (ref 38–126)
Bilirubin, Direct: 0.2 mg/dL (ref 0.0–0.2)
Indirect Bilirubin: 1.1 mg/dL — ABNORMAL HIGH (ref 0.3–0.9)
Total Bilirubin: 1.3 mg/dL — ABNORMAL HIGH (ref 0.3–1.2)
Total Protein: 6.5 g/dL (ref 6.5–8.1)

## 2020-03-01 LAB — URINALYSIS, ROUTINE W REFLEX MICROSCOPIC
Bilirubin Urine: NEGATIVE
Glucose, UA: NEGATIVE mg/dL
Hgb urine dipstick: NEGATIVE
Ketones, ur: NEGATIVE mg/dL
Leukocytes,Ua: NEGATIVE
Nitrite: NEGATIVE
Protein, ur: NEGATIVE mg/dL
Specific Gravity, Urine: 1.004 — ABNORMAL LOW (ref 1.005–1.030)
pH: 7 (ref 5.0–8.0)

## 2020-03-01 LAB — LIPASE, BLOOD: Lipase: 19 U/L (ref 11–51)

## 2020-03-01 LAB — TROPONIN I (HIGH SENSITIVITY)
Troponin I (High Sensitivity): 7 ng/L (ref <18)
Troponin I (High Sensitivity): 6 ng/L (ref <18)

## 2020-03-01 MED ORDER — SODIUM CHLORIDE 0.9% FLUSH: 3.0000 mL | Freq: Once | INTRAVENOUS | Status: DC

## 2020-03-01 MED ORDER — LIDOCAINE VISCOUS HCL 2 % MT SOLN
15.0000 mL | Freq: Once | OROMUCOSAL | Status: AC
  Administered 2020-03-01: 15 mL via ORAL
  Filled 2020-03-01: qty 15

## 2020-03-01 MED ORDER — POTASSIUM CHLORIDE CRYS ER 20 MEQ PO TBCR
40.0000 meq | EXTENDED_RELEASE_TABLET | Freq: Once | ORAL | Status: AC
  Administered 2020-03-01: 40 meq via ORAL
  Filled 2020-03-01: qty 2

## 2020-03-01 MED ORDER — IOHEXOL 300 MG/ML  SOLN
100.0000 mL | Freq: Once | INTRAMUSCULAR | Status: AC | PRN
  Administered 2020-03-01: 100 mL via INTRAVENOUS

## 2020-03-01 MED ORDER — ALUM & MAG HYDROXIDE-SIMETH 200-200-20 MG/5ML PO SUSP
30.0000 mL | Freq: Once | ORAL | Status: AC
  Administered 2020-03-01: 30 mL via ORAL
  Filled 2020-03-01: qty 30

## 2020-03-01 NOTE — ED Triage Notes (Signed)
C/o chest pain onset last night  States pain radiates across his chest.

## 2020-03-01 NOTE — ED Notes (Signed)
Using an interpreter, Patient verbalizes understanding of discharge instructions. Opportunity for questioning and answers were provided. Armband removed by staff, pt discharged from ED in wheelchair to home.

## 2020-03-01 NOTE — ED Provider Notes (Signed)
[facility] EMERGENCY DEPARTMENT Provider Note   CSN: [id number] Arrival date & time: 03/01/20  1002     History Chief Complaint  Patient presents with  . Chest Pain    Jorge [name] is a 69 y.o. male with history of HTN and gastritis who presents with chest pain. History is limited due to language barrier. Pt speaks [place] and language interpreter was used. He states that he has had chest and abdominal pain for the past 5 years. It comes and goes. It will start randomly and then will intensify and then go away on its own. He states that when it hurts he feels dizzy and short of breath. He is on Bentyl, Carafate, and just started Protonix but states he continues to have pain. He just started Protonix after being seen in the ED a couple days ago. Per chart review he had an EGD by Dr. Silverio Decamp in 2018 which showed gastritis. He denies fever, cough, current SOB, N/V. He has had 3 episodes of watery diarrhea today.   HPI     Past Medical History:  Diagnosis Date  . Acid reflux   . Arthritis    right leg  . Cataract    bil removed  . Diabetes mellitus without complication ([place])   . Hypertension     Patient Active Problem List   Diagnosis Date Noted  . Abdominal pain, epigastric 05/09/2017  . Lumbosacral spondylosis with radiculopathy 12/25/2016    Past Surgical History:  Procedure Laterality Date  . cataracts Bilateral   . LUMBAR LAMINECTOMY/DECOMPRESSION MICRODISCECTOMY Right 12/25/2016   Procedure: Lumbar three-five Laminectomy, Right Lumbar three-four, Lumbar four-five Diskectomy ;  Surgeon: Kevan Ny Ditty, MD;  Location: [facility];  Service: Neurosurgery;  Laterality: Right;       Family History  Problem Relation Age of Onset  . Colon cancer Neg Hx   . Esophageal cancer Neg Hx   . Pancreatic cancer Neg Hx   . Prostate cancer Neg Hx   . Rectal cancer Neg Hx   . Stomach cancer Neg Hx     Social History   Tobacco Use  . Smoking status:  Former Smoker    Packs/day: 0.25    Years: 40.00    Pack years: 10.00    Quit date: 12/17/2011    Years since quitting: 8.2  . Smokeless tobacco: Never Used  Substance Use Topics  . Alcohol use: No  . Drug use: No    Home Medications Prior to Admission medications   Medication Sig Start Date End Date Taking? Authorizing Provider  amLODipine (NORVASC) 5 MG tablet Take 5 mg by mouth daily. 07/16/16   [provider]  atorvastatin (LIPITOR) 40 MG tablet Take 40 mg by mouth daily at 6 PM.    [provider]  benzonatate (TESSALON) 100 MG capsule Take 2 capsules (200 mg total) by mouth 2 (two) times daily as needed for cough. 10/13/17   Montine Circle, PA-C  cephALEXin (KEFLEX) 500 MG capsule Take 1 capsule (500 mg total) by mouth 3 (three) times daily. 09/25/17   Jola Schmidt, MD  cyclobenzaprine (FLEXERIL) 10 MG tablet Take 1 tablet (10 mg total) by mouth 2 (two) times daily as needed for muscle spasms. 02/19/18   Domenic Moras, PA-C  dicyclomine (BENTYL) 20 MG tablet Take 1 tablet (20 mg total) by mouth 2 (two) times daily. Patient not taking: Reported on 09/05/2017 05/01/17   Frederica Kuster, PA-C  esomeprazole (NEXIUM) 40 MG capsule Take  1 capsule (40 mg total) by mouth daily. 02/27/20   Margarita Mail, PA-C  famotidine (PEPCID) 20 MG tablet Take 1 tablet (20 mg total) by mouth 2 (two) times daily. 09/05/17   Kirichenko, Tatyana, PA-C  gabapentin (NEURONTIN) 100 MG capsule Take 100 mg by mouth 2 (two) times daily. 08/23/17   [provider]  omeprazole (PRILOSEC) 20 MG capsule Take 1 capsule (20 mg total) by mouth daily. 09/05/17   Kirichenko, Tatyana, PA-C  pantoprazole (PROTONIX) 40 MG tablet Take 1 tablet (40 mg total) by mouth 2 (two) times daily. Patient not taking: Reported on 09/05/2017 06/16/17   Horton, Barbette Hair, MD  polyethylene glycol powder (MIRALAX) powder Take 17 g by mouth daily. 09/25/17   Jola Schmidt, MD  sucralfate (CARAFATE) 1 g tablet Take 1 tablet (1  g total) by mouth 4 (four) times daily. 12/13/19 02/11/20  Bonnita Hollow, MD  traMADol (ULTRAM) 50 MG tablet Take 1 tablet (50 mg total) by mouth 2 (two) times daily as needed for moderate pain or severe pain. 02/19/18   Domenic Moras, PA-C    Allergies    Patient has no known allergies.  Review of Systems   Review of Systems  Constitutional: Negative for fever.  Respiratory: Positive for shortness of breath. Negative for cough and wheezing.   Cardiovascular: Positive for chest pain. Negative for palpitations and leg swelling.  Gastrointestinal: Positive for abdominal pain and diarrhea. Negative for nausea and vomiting.  Neurological: Positive for dizziness. Negative for syncope and headaches.  All other systems reviewed and are negative.   Physical Exam Updated Vital Signs BP (!) 145/78 (BP Location: Right Arm)   Pulse 60   Temp 97.9 F (36.6 C) (Oral)   Resp 16   SpO2 93%   Physical Exam Vitals and nursing note reviewed.  Constitutional:      General: He is not in acute distress.    Appearance: He is well-developed. He is not ill-appearing.     Comments: Calm and cooperative. NAD  HENT:     Head: Normocephalic and atraumatic.  Eyes:     General: No scleral icterus.       Right eye: No discharge.        Left eye: No discharge.     Conjunctiva/sclera: Conjunctivae normal.     Pupils: Pupils are equal, round, and reactive to light.  Cardiovascular:     Rate and Rhythm: Normal rate and regular rhythm.  Pulmonary:     Effort: Pulmonary effort is normal. No respiratory distress.     Breath sounds: Normal breath sounds.  Abdominal:     General: There is no distension.     Palpations: Abdomen is soft.     Tenderness: There is abdominal tenderness (epigastric).  Musculoskeletal:     Cervical back: Normal range of motion.     Right lower leg: No edema.     Left lower leg: No edema.  Skin:    General: Skin is warm and dry.  Neurological:     Mental Status: He is alert  and oriented to person, place, and time.  Psychiatric:        Behavior: Behavior normal.     ED Results / Procedures / Treatments   Labs (all labs ordered are listed, but only abnormal results are displayed) Labs Reviewed  BASIC METABOLIC PANEL - Abnormal; Notable for the following components:      Result Value   Potassium 3.1 (*)    Glucose, Bld 151 (*)  BUN 5 (*)    All other components within normal limits  URINALYSIS, ROUTINE W REFLEX MICROSCOPIC - Abnormal; Notable for the following components:   Color, Urine STRAW (*)    Specific Gravity, Urine 1.004 (*)    All other components within normal limits  HEPATIC FUNCTION PANEL - Abnormal; Notable for the following components:   Total Bilirubin 1.3 (*)    Indirect Bilirubin 1.1 (*)    All other components within normal limits  CBC  LIPASE, BLOOD  TROPONIN I (HIGH SENSITIVITY)  TROPONIN I (HIGH SENSITIVITY)    EKG None  Radiology DG Chest 2 View  Result Date: 03/01/2020 CLINICAL DATA:  69 year old male with history of chest pain. EXAM: CHEST - 2 VIEW COMPARISON:  Chest x-ray 12/13/2019. FINDINGS: Lung volumes are normal. No consolidative airspace disease. No pleural effusions. No pneumothorax. No pulmonary nodule or mass noted. Pulmonary vasculature and the cardiomediastinal silhouette are within normal limits. Unusual lucencies beneath the diaphragm, which may simply reflect intragastric air, however, but the possibility of pneumatosis within the stomach warrants consideration. IMPRESSION: 1. No radiographic evidence of acute cardiopulmonary disease. 2. Unusual findings in the upper abdomen, as above. If there is any clinical concern for acute abdominal pathology, further evaluation with contrast enhanced CT of the abdomen and pelvis would be recommended. Electronically Signed   By: Vinnie Langton M.D.   On: 03/01/2020 11:02   CT Abdomen Pelvis W Contrast  Result Date: 03/01/2020 CLINICAL DATA:  Epigastric pain. EXAM: CT  ABDOMEN AND PELVIS WITH CONTRAST TECHNIQUE: Multidetector CT imaging of the abdomen and pelvis was performed using the standard protocol following bolus administration of intravenous contrast. Automatic exposure control utilized. CONTRAST:  181mL OMNIPAQUE IOHEXOL 300 MG/ML  SOLN COMPARISON:  September 25, 2017 FINDINGS: Lower chest: Small hiatal hernia. Subpleural atelectasis in the lower lungs. No dependent pleural fluid. Hepatobiliary: A benign-sized 5 mm hepatic cyst in the left hemiliver. Otherwise normal imaged portion of the a hepatic parenchyma and vasculature, gallbladder and biliary tree. Normal 5 mm caliber of the common bile duct. Non-imaging/non-evaluation of the hepatic dome. No ascites. Pancreas: Mild atrophy.  No acute pancreatitis. Spleen: Normal. Adrenals/Urinary Tract: Normal. A couple 3 mm left renal upper pole and a 6 mm right renal lower pole hypoenhancing lesions, benign-sized according to the ACR criteria; no additional imaging characterization is indicated. Stomach/Bowel: A small amount dense material layering on the gastric fundus dependent wall, probably dense ingested contents. Incomplete distension of the gastric lumen with apparent mild wall thickening along the greater curvature and prominence of the perigastric vasculature, likely a mild nonspecific gastritis. No free intraperitoneal air or perigastric abscess. No bowel obstruction. Large stool burden. No apparent bowel wall thickening. Normal appendix, coronal image 50. Vascular/Lymphatic: Small scattered benign-appearing periportal and retroperitoneal lymph nodes. Aorto bi-iliac calcified atherosclerosis without an abdominal aortic aneurysm. Aortic caliber measures 2.6 cm anteroposteriorly. Reproductive: Prostatomegaly. Other: None. Musculoskeletal: Moderate degenerative change. Posterior spinal decompression at L4 and right L5. Likely benign L3 and L5 bone islands. L4 right pars interarticularis defect. IMPRESSION: Small hiatal  hernia and mild nonspecific gastritis; no obstruction or perforation. A small amount of dense material layering on the dependent wall of the gastric fundus, probably dense ingested contents. Aortic calcified atherosclerosis. Ectatic abdominal aorta at risk for aneurysm development. Recommend followup by ultrasound in 5 years. This recommendation follows ACR consensus guidelines: White Paper of the ACR Incidental Findings Committee II on Vascular Findings. J Am Coll Radiol 2013; 10:789-794. Aortic aneurysm NOS (ICD10-I71.9) Electronically Signed  By: Revonda Humphrey   On: 03/01/2020 16:24    Procedures Procedures (including critical care time)  Medications Ordered in ED Medications  sodium chloride flush (NS) 0.9 % injection 3 mL (0 mLs Intravenous Hold 03/01/20 1610)  alum & mag hydroxide-simeth (MAALOX/MYLANTA) 200-200-20 MG/5ML suspension 30 mL (30 mLs Oral Given 03/01/20 1336)    And  lidocaine (XYLOCAINE) 2 % viscous mouth solution 15 mL (15 mLs Oral Given 03/01/20 1335)  potassium chloride SA (KLOR-CON) CR tablet 40 mEq (40 mEq Oral Given 03/01/20 1334)  iohexol (OMNIPAQUE) 300 MG/ML solution 100 mL (100 mLs Intravenous Contrast Given 03/01/20 1547)    ED Course  I have reviewed the triage vital signs and the nursing notes.  Pertinent labs & imaging results that were available during my care of the patient were reviewed by me and considered in my medical decision making (see chart for details).  69 year old male presents with acute on chronic chest/abdominal pain. He is mildly hypertensive but otherwise vitals are normal. Heart rate is slightly brady and regular. EKG shows sinus bradycardia. Lungs are CTA. Abdomen is soft and minimally tender in the epigastric region. Chest pain work up was initiated in triage. Labs show mild hypokalemia (3.1). Will add on lipase and LFTs. 1st trop is 7.  LFTs and lipase are normal. 2nd trop is 6. UA is normal. CXR shows possible air in the stomach. Will obtain CT  abdomen/pelvis.  CT shows small hiatal hernia and gastritis. Shared visit with Dr Vanita Panda. Even with the interpreter I'm not totally confident that the patient understands what I'm saying. It's also difficult to make medication recommendations because he doesn't know what he's taking and he didn't bring a list with him. He was prescribed Nexium a couple days ago and states he's been taking this. Advised continue this and Carafate. He was given f/u with Dr Silverio Decamp who he's seen in the past.  MDM Rules/Calculators/A&P                       Final Clinical Impression(s) / ED Diagnoses Final diagnoses:  Atypical chest pain  Epigastric abdominal pain    Rx / DC Orders ED Discharge Orders    None       Recardo Evangelist, PA-C 03/01/20 Tomasa Hose, MD 03/04/20 2025

## 2020-03-01 NOTE — Discharge Instructions (Signed)
Take Nexium once daily for gastritis (stomach inflammation) Continue Carafate 4 times a day for stomach pain Please follow up with Dr. Silverio Decamp (GI doctor)

## 2020-03-28 ENCOUNTER — Encounter (HOSPITAL_COMMUNITY): Payer: Self-pay

## 2020-03-28 ENCOUNTER — Emergency Department (HOSPITAL_COMMUNITY)
Admission: EM | Admit: 2020-03-28 | Discharge: 2020-03-28 | Disposition: A | Discharge status: Home / Self Care | Payer: Medicare Other | Attending: Emergency Medicine | Admitting: Emergency Medicine

## 2020-03-28 ENCOUNTER — Emergency Department (HOSPITAL_COMMUNITY): Payer: Medicare Other

## 2020-03-28 DIAGNOSIS — Z87891 Personal history of nicotine dependence: Secondary | ICD-10-CM | POA: Insufficient documentation

## 2020-03-28 DIAGNOSIS — E119 Type 2 diabetes mellitus without complications: Secondary | ICD-10-CM | POA: Insufficient documentation

## 2020-03-28 DIAGNOSIS — I10 Essential (primary) hypertension: Secondary | ICD-10-CM | POA: Diagnosis not present

## 2020-03-28 DIAGNOSIS — Z79899 Other long term (current) drug therapy: Secondary | ICD-10-CM | POA: Insufficient documentation

## 2020-03-28 DIAGNOSIS — R1013 Epigastric pain: Secondary | ICD-10-CM | POA: Diagnosis present

## 2020-03-28 DIAGNOSIS — K2900 Acute gastritis without bleeding: Secondary | ICD-10-CM | POA: Insufficient documentation

## 2020-03-28 LAB — COMPREHENSIVE METABOLIC PANEL
ALT: 34 U/L (ref 0–44)
AST: 38 U/L (ref 15–41)
Albumin: 4.1 g/dL (ref 3.5–5.0)
Alkaline Phosphatase: 93 U/L (ref 38–126)
Anion gap: 10 (ref 5–15)
BUN: 9 mg/dL (ref 8–23)
CO2: 23 mmol/L (ref 22–32)
Calcium: 9.4 mg/dL (ref 8.9–10.3)
Chloride: 105 mmol/L (ref 98–111)
Creatinine, Ser: 0.98 mg/dL (ref 0.61–1.24)
GFR calc Af Amer: >60 mL/min (ref >60)
GFR calc non Af Amer: >60 mL/min (ref >60)
Glucose, Bld: 99 mg/dL (ref 70–99)
Potassium: 4 mmol/L (ref 3.5–5.1)
Sodium: 138 mmol/L (ref 135–145)
Total Bilirubin: 1.7 mg/dL — ABNORMAL HIGH (ref 0.3–1.2)
Total Protein: 7 g/dL (ref 6.5–8.1)

## 2020-03-28 LAB — CBC WITH DIFFERENTIAL/PLATELET
Abs Immature Granulocytes: 0.01 10*3/uL (ref 0.00–0.07)
Basophils Absolute: 0 10*3/uL (ref 0.0–0.1)
Basophils Relative: 1 %
Eosinophils Absolute: 0.2 10*3/uL (ref 0.0–0.5)
Eosinophils Relative: 6 %
HCT: 44 % (ref 39.0–52.0)
Hemoglobin: 15.4 g/dL (ref 13.0–17.0)
Immature Granulocytes: 0 %
Lymphocytes Relative: 43 %
Lymphs Abs: 1.8 10*3/uL (ref 0.7–4.0)
MCH: 31.2 pg (ref 26.0–34.0)
MCHC: 35 g/dL (ref 30.0–36.0)
MCV: 89.1 fL (ref 80.0–100.0)
Monocytes Absolute: 0.4 10*3/uL (ref 0.1–1.0)
Monocytes Relative: 10 %
Neutro Abs: 1.7 10*3/uL (ref 1.7–7.7)
Neutrophils Relative %: 40 %
Platelets: 148 10*3/uL — ABNORMAL LOW (ref 150–400)
RBC: 4.94 MIL/uL (ref 4.22–5.81)
RDW: 11.9 % (ref 11.5–15.5)
WBC: 4.2 10*3/uL (ref 4.0–10.5)
nRBC: 0 % (ref 0.0–0.2)

## 2020-03-28 LAB — CBG MONITORING, ED: Glucose-Capillary: 97 mg/dL (ref 70–99)

## 2020-03-28 LAB — LIPASE, BLOOD: Lipase: 20 U/L (ref 11–51)

## 2020-03-28 LAB — TROPONIN I (HIGH SENSITIVITY): Troponin I (High Sensitivity): 7 ng/L (ref <18)

## 2020-03-28 MED ORDER — ALUM & MAG HYDROXIDE-SIMETH 200-200-20 MG/5ML PO SUSP
30.0000 mL | Freq: Once | ORAL | Status: AC
  Administered 2020-03-28: 08:00:00 30 mL via ORAL
  Filled 2020-03-28: qty 30

## 2020-03-28 MED ORDER — CLARITHROMYCIN 500 MG PO TABS: 500.0000 mg | ORAL_TABLET | Freq: Two times a day (BID) | ORAL | 0 refills | Status: DC

## 2020-03-28 MED ORDER — SODIUM CHLORIDE 0.9 % IV BOLUS
1000.0000 mL | Freq: Once | INTRAVENOUS | Status: AC
  Administered 2020-03-28: 1000 mL via INTRAVENOUS

## 2020-03-28 MED ORDER — METRONIDAZOLE 500 MG PO TABS: 500.0000 mg | ORAL_TABLET | Freq: Three times a day (TID) | ORAL | 0 refills | Status: AC

## 2020-03-28 MED ORDER — PANTOPRAZOLE SODIUM 20 MG PO TBEC: 20.0000 mg | DELAYED_RELEASE_TABLET | Freq: Every day | ORAL | 0 refills | Status: DC

## 2020-03-28 NOTE — ED Notes (Addendum)
Patient verbalizes understanding of discharge instructions. Opportunity for questioning and answers were provided. Armband removed by staff, pt discharged from ED to home with family via [place]. Pt states he will f/u with GI once back in [place].

## 2020-03-28 NOTE — ED Provider Notes (Signed)
[place] EMERGENCY DEPARTMENT Provider Note   CSN: [id number] Arrival date & time: 03/28/20  [id number]     History Chief Complaint  Patient presents with   Abdominal Pain    Jorge [name] is a 69 y.o. male with a past medical history of diabetes, hypertension, hyperlipidemia, GERD presenting to the ED with a chief complaint of epigastric pain.  Reports sharp pain to the epigastric area radiating to his chest.  Pain has been intermittent for the past 5 years but worsened since yesterday.  Reports associated shortness of breath, dry cough but denies any nausea, vomiting, diarrhea, fevers or sick contacts with similar symptoms.  He has been taking his medications daily he supposed to including Carafate which she does not feel helps his symptoms.  He reports burning to both of his feet but denies any leg swelling, injuries, history of DVT or PE or MI.  Patient has been seen and evaluated for similar symptoms in the past, diagnosed with reflux but I am unsure if he takes any other medications for reflux and has not seen a specialist about this in the past.   HPI     Past Medical History:  Diagnosis Date   Acid reflux    Arthritis    right leg   Cataract    bil removed   Diabetes mellitus without complication ([place])    Hypertension     Patient Active Problem List   Diagnosis Date Noted   Abdominal pain, epigastric 05/09/2017   Lumbosacral spondylosis with radiculopathy 12/25/2016    Past Surgical History:  Procedure Laterality Date   cataracts Bilateral    LUMBAR LAMINECTOMY/DECOMPRESSION MICRODISCECTOMY Right 12/25/2016   Procedure: Lumbar three-five Laminectomy, Right Lumbar three-four, Lumbar four-five Diskectomy ;  Surgeon: Kevan Ny Ditty, MD;  Location: [facility];  Service: Neurosurgery;  Laterality: Right;       Family History  Problem Relation Age of Onset   Colon cancer Neg Hx    Esophageal cancer Neg Hx    Pancreatic cancer Neg Hx      Prostate cancer Neg Hx    Rectal cancer Neg Hx    Stomach cancer Neg Hx     Social History   Tobacco Use   Smoking status: Former Smoker    Packs/day: 0.25    Years: 40.00    Pack years: 10.00    Quit date: 12/17/2011    Years since quitting: 8.2   Smokeless tobacco: Never Used  Substance Use Topics   Alcohol use: No   Drug use: No    Home Medications Prior to Admission medications   Medication Sig Start Date End Date Taking? Authorizing Provider  amLODipine (NORVASC) 5 MG tablet Take 5 mg by mouth daily. 07/16/16  Yes [provider]  atorvastatin (LIPITOR) 40 MG tablet Take 40 mg by mouth daily at 6 PM.   Yes [provider]  cyclobenzaprine (FLEXERIL) 10 MG tablet Take 1 tablet (10 mg total) by mouth 2 (two) times daily as needed for muscle spasms. 02/19/18  Yes Domenic Moras, PA-C  dicyclomine (BENTYL) 20 MG tablet Take 20 mg by mouth 4 (four) times daily. 03/25/20  Yes [provider]  esomeprazole (NEXIUM) 40 MG capsule Take 1 capsule (40 mg total) by mouth daily. 02/27/20  Yes Harris, Abigail, PA-C  famotidine (PEPCID) 20 MG tablet Take 1 tablet (20 mg total) by mouth 2 (two) times daily. 09/05/17  Yes Kirichenko, Tatyana, PA-C  gabapentin (NEURONTIN) 100 MG capsule Take  100 mg by mouth 2 (two) times daily. 08/23/17  Yes [provider]  polyethylene glycol powder (MIRALAX) powder Take 17 g by mouth daily. 09/25/17  Yes Jola Schmidt, MD  sucralfate (CARAFATE) 1 g tablet Take 1 tablet (1 g total) by mouth 4 (four) times daily. 12/13/19 03/28/20 Yes Bonnita Hollow, MD  traMADol (ULTRAM) 50 MG tablet Take 1 tablet (50 mg total) by mouth 2 (two) times daily as needed for moderate pain or severe pain. 02/19/18  Yes Domenic Moras, PA-C  benzonatate (TESSALON) 100 MG capsule Take 2 capsules (200 mg total) by mouth 2 (two) times daily as needed for cough. Patient not taking: Reported on 03/28/2020 10/13/17   Montine Circle, PA-C  clarithromycin  (BIAXIN) 500 MG tablet Take 1 tablet (500 mg total) by mouth 2 (two) times daily. 03/28/20   Aime Carreras, PA-C  metroNIDAZOLE (FLAGYL) 500 MG tablet Take 1 tablet (500 mg total) by mouth 3 (three) times daily for 14 days. 03/28/20 04/11/20  Sinda Leedom, PA-C  omeprazole (PRILOSEC) 20 MG capsule Take 1 capsule (20 mg total) by mouth daily. Patient not taking: Reported on 03/28/2020 09/05/17   Jeannett Senior, PA-C  pantoprazole (PROTONIX) 20 MG tablet Take 1 tablet (20 mg total) by mouth daily. 03/28/20   Liany Mumpower, PA-C    Allergies    Ivp dye [iodinated diagnostic agents]  Review of Systems   Review of Systems  Constitutional: Negative for appetite change, chills and fever.  HENT: Negative for ear pain, rhinorrhea, sneezing and sore throat.   Eyes: Negative for photophobia and visual disturbance.  Respiratory: Positive for cough and shortness of breath. Negative for chest tightness and wheezing.   Cardiovascular: Negative for chest pain and palpitations.  Gastrointestinal: Positive for abdominal pain. Negative for blood in stool, constipation, diarrhea, nausea and vomiting.  Genitourinary: Negative for dysuria, hematuria and urgency.  Musculoskeletal: Negative for myalgias.  Skin: Negative for rash.  Neurological: Negative for dizziness, weakness and light-headedness.    Physical Exam Updated Vital Signs BP (!) 149/66    Pulse (!) 56    Temp 98 F (36.7 C) (Oral)    Resp 20    Ht 5\' 2"  (1.575 m)    Wt 67.1 kg    SpO2 96%    BMI 27.07 kg/m   Physical Exam Vitals and nursing note reviewed.  Constitutional:      General: He is not in acute distress.    Appearance: He is well-developed.  HENT:     Head: Normocephalic and atraumatic.     Nose: Nose normal.  Eyes:     General: No scleral icterus.       Left eye: No discharge.     Conjunctiva/sclera: Conjunctivae normal.  Cardiovascular:     Rate and Rhythm: Normal rate and regular rhythm.     Heart sounds: Normal heart sounds. No  murmur. No friction rub. No gallop.   Pulmonary:     Effort: Pulmonary effort is normal. No respiratory distress.     Breath sounds: Normal breath sounds.  Abdominal:     General: Bowel sounds are normal. There is no distension.     Palpations: Abdomen is soft.     Tenderness: There is abdominal tenderness in the epigastric area. There is no guarding.  Musculoskeletal:        General: Normal range of motion.     Cervical back: Normal range of motion and neck supple.     Right lower leg: No edema.  Left lower leg: No edema.     Comments: No lower extremity edema, erythema or calf tenderness bilaterally.  Skin:    General: Skin is warm and dry.     Findings: No rash.  Neurological:     Mental Status: He is alert.     Motor: No abnormal muscle tone.     Coordination: Coordination normal.     ED Results / Procedures / Treatments   Labs (all labs ordered are listed, but only abnormal results are displayed) Labs Reviewed  COMPREHENSIVE METABOLIC PANEL - Abnormal; Notable for the following components:      Result Value   Total Bilirubin 1.7 (*)    All other components within normal limits  CBC WITH DIFFERENTIAL/PLATELET - Abnormal; Notable for the following components:   Platelets 148 (*)    All other components within normal limits  LIPASE, BLOOD  CBG MONITORING, ED  TROPONIN I (HIGH SENSITIVITY)    EKG EKG Interpretation  Date/Time:  Thursday March 28 2020 06:40:44 EDT Ventricular Rate:  54 PR Interval:    QRS Duration: 100 QT Interval:  424 QTC Calculation: 402 R Axis:   45 Text Interpretation: Sinus rhythm Abnormal inferior Q waves Confirmed by Gerlene Fee [phone]) on 03/28/2020 8:50:50 AM   Radiology DG Chest 2 View  Result Date: 03/28/2020 CLINICAL DATA:  Chest pain, epigastric pain EXAM: CHEST - 2 VIEW COMPARISON:  None. FINDINGS: The heart size and mediastinal contours are within normal limits. Both lungs are clear. No pleural effusion or pneumothorax. The  visualized skeletal structures are unremarkable. IMPRESSION: No acute process in the chest. Electronically Signed   By: Macy Mis M.D.   On: 03/28/2020 07:49    Procedures Procedures (including critical care time)  Medications Ordered in ED Medications  sodium chloride 0.9 % bolus 1,000 mL (1,000 mLs Intravenous New Bag/Given 03/28/20 0811)  alum & mag hydroxide-simeth (MAALOX/MYLANTA) 200-200-20 MG/5ML suspension 30 mL (30 mLs Oral Given 03/28/20 [id number])    ED Course  I have reviewed the triage vital signs and the nursing notes.  Pertinent labs & imaging results that were available during my care of the patient were reviewed by me and considered in my medical decision making (see chart for details).    MDM Rules/Calculators/A&P                      69yo M with a past medical history of HTN, HLD, diabetes presents to the ED with a chief complaint of epigastric pain, cough and shortness of breath. Symptoms have been going on for 5 years but worsened in the past 2 days.  States that the pain will radiate to his chest.  He has been taking medications as prescribed including Carafate with only minimal improvement in his symptoms.  He denies vomiting, diarrhea.  On my exam patient has tenderness palpation of the epigastric area. He has no chest tenderness to palpation on my exam.  No lower extremity edema, erythema or calf tenderness are concerning for DVT.  He is speaking in complete sentences without difficulty, He is not tachycardic, tachypneic or hypoxic.  He denies history of DVT, MI or PE in the past.  Lab work including CBC, CMP, lipase and troponin are unremarkable.  EKG without any acute ischemic changes.  Shows normal sinus rhythm.  Chest x-ray without any acute findings.  Patient was given GI cocktail with some improvement in his symptoms.  Suspect that this is ongoing related to his gastritis which  she was diagnosed with several weeks ago.  Some suspicion for H. pylori infection so we will  treat with triple therapy for this.  I doubt ACS as a cause of his symptoms although he does have risk factors for this, his troponin is negative today and his EKG is nonischemic and with his symptoms going on for over 5 years we will find some abnormalities if related to ACS.  He is low risk by Wells criteria for PE.  Patient is agreeable to following up with GI and return for worsening symptoms.  Patient is hemodynamically stable, in NAD, and able to ambulate in the ED. Evaluation does not show pathology that would require ongoing emergent intervention or inpatient treatment. I have personally reviewed and interpreted all lab work and imaging at today's ED visit. I explained the diagnosis to the patient. Pain has been managed and has no complaints prior to discharge. Patient is comfortable with above plan and is stable for discharge at this time. All questions were answered prior to disposition. Strict return precautions for returning to the ED were discussed. Encouraged follow up with PCP.   An After Visit Summary was printed and given to the patient.   Portions of this note were generated with [employer]. Dictation errors may occur despite best attempts at proofreading.  Final Clinical Impression(s) / ED Diagnoses Final diagnoses:  Acute superficial gastritis without hemorrhage    Rx / DC Orders ED Discharge Orders         Ordered    clarithromycin (BIAXIN) 500 MG tablet  2 times daily     03/28/20 0921    metroNIDAZOLE (FLAGYL) 500 MG tablet  3 times daily     03/28/20 0921    pantoprazole (PROTONIX) 20 MG tablet  Daily     03/28/20 0921           Delia Heady, PA-C 03/28/20 [id number]    Maudie Flakes, MD 04/01/20 [phone]

## 2020-03-28 NOTE — Discharge Instructions (Signed)
Take these three new medications daily for the next 14 days to help with your symptoms. Follow up with your GI specialist. Return to the ED if you start to experience worsening symptoms, chest pain, shortness of breath, vomiting or coughing up blood.

## 2020-03-28 NOTE — ED Triage Notes (Signed)
Patient reports burning to bilateral hands and feet.  States pain to feet and had been having trouble walking.  Also reports some headache and dizziness.  States all this started 5 years ago but got worse yesterday evening

## 2020-03-28 NOTE — ED Notes (Signed)
Lollie Sails [phone] daughter is available over the phone and will be back as well. States patient speaks [place].

## 2020-03-30 ENCOUNTER — Emergency Department (HOSPITAL_COMMUNITY): Payer: Medicare Other

## 2020-03-30 ENCOUNTER — Emergency Department (HOSPITAL_COMMUNITY)
Admission: EM | Admit: 2020-03-30 | Discharge: 2020-03-30 | Disposition: A | Discharge status: Home / Self Care | Payer: Medicare Other | Attending: Emergency Medicine | Admitting: Emergency Medicine

## 2020-03-30 ENCOUNTER — Encounter (HOSPITAL_COMMUNITY): Payer: Self-pay

## 2020-03-30 DIAGNOSIS — R1013 Epigastric pain: Secondary | ICD-10-CM | POA: Diagnosis present

## 2020-03-30 DIAGNOSIS — E119 Type 2 diabetes mellitus without complications: Secondary | ICD-10-CM | POA: Diagnosis not present

## 2020-03-30 DIAGNOSIS — I10 Essential (primary) hypertension: Secondary | ICD-10-CM | POA: Diagnosis not present

## 2020-03-30 DIAGNOSIS — Z79899 Other long term (current) drug therapy: Secondary | ICD-10-CM | POA: Diagnosis not present

## 2020-03-30 DIAGNOSIS — R197 Diarrhea, unspecified: Secondary | ICD-10-CM | POA: Diagnosis not present

## 2020-03-30 DIAGNOSIS — Z20822 Contact with and (suspected) exposure to covid-19: Secondary | ICD-10-CM | POA: Diagnosis not present

## 2020-03-30 DIAGNOSIS — Z87891 Personal history of nicotine dependence: Secondary | ICD-10-CM | POA: Diagnosis not present

## 2020-03-30 LAB — URINALYSIS, ROUTINE W REFLEX MICROSCOPIC
Bilirubin Urine: NEGATIVE
Glucose, UA: NEGATIVE mg/dL
Hgb urine dipstick: NEGATIVE
Ketones, ur: NEGATIVE mg/dL
Leukocytes,Ua: NEGATIVE
Nitrite: NEGATIVE
Protein, ur: NEGATIVE mg/dL
Specific Gravity, Urine: 1.008 (ref 1.005–1.030)
pH: 6 (ref 5.0–8.0)

## 2020-03-30 LAB — CBC WITH DIFFERENTIAL/PLATELET
Abs Immature Granulocytes: 0.01 10*3/uL (ref 0.00–0.07)
Basophils Absolute: 0.1 10*3/uL (ref 0.0–0.1)
Basophils Relative: 1 %
Eosinophils Absolute: 0.2 10*3/uL (ref 0.0–0.5)
Eosinophils Relative: 4 %
HCT: 44 % (ref 39.0–52.0)
Hemoglobin: 15.1 g/dL (ref 13.0–17.0)
Immature Granulocytes: 0 %
Lymphocytes Relative: 40 %
Lymphs Abs: 1.8 10*3/uL (ref 0.7–4.0)
MCH: 31 pg (ref 26.0–34.0)
MCHC: 34.3 g/dL (ref 30.0–36.0)
MCV: 90.3 fL (ref 80.0–100.0)
Monocytes Absolute: 0.5 10*3/uL (ref 0.1–1.0)
Monocytes Relative: 10 %
Neutro Abs: 1.9 10*3/uL (ref 1.7–7.7)
Neutrophils Relative %: 45 %
Platelets: 154 10*3/uL (ref 150–400)
RBC: 4.87 MIL/uL (ref 4.22–5.81)
RDW: 11.9 % (ref 11.5–15.5)
WBC: 4.4 10*3/uL (ref 4.0–10.5)
nRBC: 0 % (ref 0.0–0.2)

## 2020-03-30 LAB — COMPREHENSIVE METABOLIC PANEL
ALT: 33 U/L (ref 0–44)
AST: 38 U/L (ref 15–41)
Albumin: 4.3 g/dL (ref 3.5–5.0)
Alkaline Phosphatase: 91 U/L (ref 38–126)
Anion gap: 11 (ref 5–15)
BUN: 7 mg/dL — ABNORMAL LOW (ref 8–23)
CO2: 21 mmol/L — ABNORMAL LOW (ref 22–32)
Calcium: 9.2 mg/dL (ref 8.9–10.3)
Chloride: 107 mmol/L (ref 98–111)
Creatinine, Ser: 1 mg/dL (ref 0.61–1.24)
GFR calc Af Amer: >60 mL/min (ref >60)
GFR calc non Af Amer: >60 mL/min (ref >60)
Glucose, Bld: 109 mg/dL — ABNORMAL HIGH (ref 70–99)
Potassium: 4 mmol/L (ref 3.5–5.1)
Sodium: 139 mmol/L (ref 135–145)
Total Bilirubin: 1.1 mg/dL (ref 0.3–1.2)
Total Protein: 7.4 g/dL (ref 6.5–8.1)

## 2020-03-30 LAB — SARS CORONAVIRUS 2 (TAT 6-24 HRS): SARS Coronavirus 2: NEGATIVE

## 2020-03-30 LAB — LIPASE, BLOOD: Lipase: 21 U/L (ref 11–51)

## 2020-03-30 MED ORDER — ONDANSETRON HCL 4 MG/2ML IJ SOLN
4.0000 mg | Freq: Once | INTRAMUSCULAR | Status: AC
  Administered 2020-03-30: 4 mg via INTRAVENOUS
  Filled 2020-03-30: qty 2

## 2020-03-30 MED ORDER — SODIUM CHLORIDE 0.9% FLUSH: 3.0000 mL | Freq: Once | INTRAVENOUS | Status: DC

## 2020-03-30 MED ORDER — SODIUM CHLORIDE 0.9 % IV BOLUS
1000.0000 mL | Freq: Once | INTRAVENOUS | Status: AC
  Administered 2020-03-30: 1000 mL via INTRAVENOUS

## 2020-03-30 MED ORDER — BENZONATATE 100 MG PO CAPS: 100.0000 mg | ORAL_CAPSULE | Freq: Three times a day (TID) | ORAL | 0 refills | Status: DC | PRN

## 2020-03-30 MED ORDER — MORPHINE SULFATE (PF) 4 MG/ML IV SOLN
4.0000 mg | Freq: Once | INTRAVENOUS | Status: AC
  Administered 2020-03-30: 4 mg via INTRAVENOUS
  Filled 2020-03-30 (×2): qty 1

## 2020-03-30 NOTE — Discharge Instructions (Signed)
You have been evaluated for your diarrhea.  Fortunately CT scan of the abdomen pelvis did not show any concerning finding.  A COVID-19 test has been obtained and likely will result within the next 24 hours.  Please check MyChart, link below, for results.  You may discontinue the previous medications prescribed 3 days ago when he was seen in the hospital.  Sometimes this medication may cause diarrhea.  Take cough medication as prescribed.  Return to the ER if you have any concern.

## 2020-03-30 NOTE — ED Provider Notes (Signed)
[name] Provider Note   CSN: [id number] Arrival date & time: 03/30/20  [id number]     History Chief Complaint  Patient presents with  . Diarrhea  . Abdominal Jorge [name]  . Leg Jorge [name]    [name] is a 69 y.o. male.  The history is provided by the patient. No language interpreter was used.  Diarrhea Associated symptoms: abdominal Jorge [name]   Abdominal Jorge [name] Associated symptoms: diarrhea   Leg Jorge [name]    69 year old [name] speaking male with history of diabetes, GERD, hypertension, presenting for evaluation of abdominal Jorge [name].  History obtained through language interpreter.  Patient report having recurrent epigastric abdominal Jorge [name] for the past 3 to 4 days.  Jorge [name] is described as a uncomfortable sensation with mild nausea but he also endorsed having recurrent bouts of diarrhea for the past 2 days.  States he has had 3-4 episodes today.  He report Jorge [name] is mild to moderate in severity sometimes worse with eating.  He endorsed some dry cough but states that is normal.  States he has had both of his COVID-19 vaccine.  His primary concern is the recurrent diarrhea.  Patient was seen in the ED for similar symptoms 3 days ago.  It was thought that he may have H. pylori and was prescribed a triple drugs treatment.  Patient did take the medication but report no improvement.  Patient's PCP is in [place].  He is down here for his son's wedding.  He denies any recent sick contact.  He does not have any chest Jorge [name] or shortness of breath no dysuria.  No report of any recent antibiotic use aside from what was prescribed 3 days ago.  Denies any hematochezia or melena.  Past Medical History:  Diagnosis Date  . Acid reflux   . Arthritis    right leg  . Cataract    bil removed  . Diabetes mellitus without complication ([place])   . Hypertension     Patient Active Problem List   Diagnosis Date Noted  . Abdominal Jorge [name], epigastric 05/09/2017  . Lumbosacral spondylosis with  radiculopathy 12/25/2016    Past Surgical History:  Procedure Laterality Date  . cataracts Bilateral   . LUMBAR LAMINECTOMY/DECOMPRESSION MICRODISCECTOMY Right 12/25/2016   Procedure: Lumbar three-five Laminectomy, Right Lumbar three-four, Lumbar four-five Diskectomy ;  Surgeon: Kevan Ny Ditty, MD;  Location: [facility];  Service: Neurosurgery;  Laterality: Right;       Family History  Problem Relation Age of Onset  . Colon cancer Neg Hx   . Esophageal cancer Neg Hx   . Pancreatic cancer Neg Hx   . Prostate cancer Neg Hx   . Rectal cancer Neg Hx   . Stomach cancer Neg Hx     Social History   Tobacco Use  . Smoking status: Former Smoker    Packs/day: 0.25    Years: 40.00    Pack years: 10.00    Quit date: 12/17/2011    Years since quitting: 8.2  . Smokeless tobacco: Never Used  Substance Use Topics  . Alcohol use: No  . Drug use: No    Home Medications Prior to Admission medications   Medication Sig Start Date End Date Taking? Authorizing Provider  amLODipine (NORVASC) 5 MG tablet Take 5 mg by mouth daily. 07/16/16   [provider]  atorvastatin (LIPITOR) 40 MG tablet Take 40 mg by mouth daily at 6 PM.    [provider]  benzonatate (TESSALON) 100 MG capsule Take 2  capsules (200 mg total) by mouth 2 (two) times daily as needed for cough. Patient not taking: Reported on 03/28/2020 10/13/17   Montine Circle, PA-C  clarithromycin (BIAXIN) 500 MG tablet Take 1 tablet (500 mg total) by mouth 2 (two) times daily. 03/28/20   Khatri, Hina, PA-C  cyclobenzaprine (FLEXERIL) 10 MG tablet Take 1 tablet (10 mg total) by mouth 2 (two) times daily as needed for muscle spasms. 02/19/18   Domenic Moras, PA-C  dicyclomine (BENTYL) 20 MG tablet Take 20 mg by mouth 4 (four) times daily. 03/25/20   [provider]  esomeprazole (NEXIUM) 40 MG capsule Take 1 capsule (40 mg total) by mouth daily. 02/27/20   Margarita Mail, PA-C  famotidine (PEPCID) 20 MG tablet Take 1  tablet (20 mg total) by mouth 2 (two) times daily. 09/05/17   Kirichenko, Tatyana, PA-C  gabapentin (NEURONTIN) 100 MG capsule Take 100 mg by mouth 2 (two) times daily. 08/23/17   [provider]  metroNIDAZOLE (FLAGYL) 500 MG tablet Take 1 tablet (500 mg total) by mouth 3 (three) times daily for 14 days. 03/28/20 04/11/20  Khatri, Hina, PA-C  omeprazole (PRILOSEC) 20 MG capsule Take 1 capsule (20 mg total) by mouth daily. Patient not taking: Reported on 03/28/2020 09/05/17   Jeannett Senior, PA-C  pantoprazole (PROTONIX) 20 MG tablet Take 1 tablet (20 mg total) by mouth daily. 03/28/20   Khatri, Hina, PA-C  polyethylene glycol powder (MIRALAX) powder Take 17 g by mouth daily. 09/25/17   Jola Schmidt, MD  sucralfate (CARAFATE) 1 g tablet Take 1 tablet (1 g total) by mouth 4 (four) times daily. 12/13/19 03/28/20  Bonnita Hollow, MD  traMADol (ULTRAM) 50 MG tablet Take 1 tablet (50 mg total) by mouth 2 (two) times daily as needed for moderate Jorge [name] or severe Jorge [name]. 02/19/18   Domenic Moras, PA-C    Allergies    Ivp dye [iodinated diagnostic agents]  Review of Systems   Review of Systems  Gastrointestinal: Positive for abdominal Jorge [name] and diarrhea.  All other systems reviewed and are negative.   Physical Exam Updated Vital Signs BP (!) 143/83 (BP Location: Left Arm)   Pulse (!) 57   Temp (!) 97.3 F (36.3 C) (Oral)   Resp 16   Ht 5' (1.524 m)   SpO2 98%   BMI 28.90 kg/m   Physical Exam Vitals and nursing note reviewed.  Constitutional:      General: He is not in acute distress.    Appearance: He is well-developed.     Comments: Elderly male lying in bed in no acute discomfort.  HENT:     Head: Atraumatic.  Eyes:     Conjunctiva/sclera: Conjunctivae normal.  Cardiovascular:     Rate and Rhythm: Normal rate and regular rhythm.  Pulmonary:     Effort: Pulmonary effort is normal.     Breath sounds: Normal breath sounds.  Abdominal:     General: Abdomen is flat. Bowel sounds are  normal.     Palpations: Abdomen is soft.     Tenderness: There is abdominal tenderness (Very mild epigastric tenderness on palpation no guarding or rebound tenderness.) in the epigastric area.  Musculoskeletal:     Cervical back: Neck supple.  Skin:    General: Skin is warm.     Findings: No rash.  Neurological:     General: No focal deficit present.     Mental Status: He is alert.  Psychiatric:        Mood and  Affect: Mood normal.     ED Results / Procedures / Treatments   Labs (all labs ordered are listed, but only abnormal results are displayed) Labs Reviewed  COMPREHENSIVE METABOLIC PANEL - Abnormal; Notable for the following components:      Result Value   CO2 21 (*)    Glucose, Bld 109 (*)    BUN 7 (*)    All other components within normal limits  SARS CORONAVIRUS 2 (TAT 6-24 HRS)  C DIFFICILE QUICK SCREEN W PCR REFLEX  LIPASE, BLOOD  URINALYSIS, ROUTINE W REFLEX MICROSCOPIC  CBC WITH DIFFERENTIAL/PLATELET    EKG None  Radiology CT ABDOMEN PELVIS WO CONTRAST  Result Date: 03/30/2020 CLINICAL DATA:  Diarrhea. EXAM: CT ABDOMEN AND PELVIS WITHOUT CONTRAST TECHNIQUE: Multidetector CT imaging of the abdomen and pelvis was performed following the standard protocol without IV contrast. COMPARISON:  CT abdomen dated 03/01/2020. FINDINGS: Lower chest: No acute abnormality. Hepatobiliary: No focal liver abnormality is seen. No gallstones, gallbladder wall thickening, or biliary dilatation. Pancreas: Unremarkable. No pancreatic ductal dilatation or surrounding inflammatory changes. Spleen: Normal in size without focal abnormality. Adrenals/Urinary Tract: Adrenal glands are unremarkable. Kidneys are normal, without renal calculi, focal lesion, or hydronephrosis. Bladder is unremarkable. Stomach/Bowel: No dilated large or small bowel loops. Fluid is seen throughout the colon and small bowel compatible with the given history of diarrhea. Moderate amount of stool in the rectosigmoid  colon. Stomach is unremarkable, partially decompressed. Appendix is normal. Vascular/Lymphatic: Aortic atherosclerosis. No enlarged lymph nodes seen. Reproductive: Prostate is unremarkable. Other: No free fluid or abscess collection. No free intraperitoneal air. Musculoskeletal: Degenerative spondylosis of the lumbar spine, mild to moderate in degree. Surgical changes of central canal decompression at the L4 and L5 vertebral body levels. No acute or suspicious osseous finding. IMPRESSION: 1. Fluid throughout the nondistended colon and small bowel, compatible with the given history of diarrhea. Moderate amount of stool in the rectosigmoid colon. No evidence of bowel obstruction. No bowel wall thickening, pericolonic inflammation or other secondary signs of active bowel wall inflammation. 2. Remainder of the abdomen and pelvis CT is unremarkable, as detailed above. No free fluid or abscess collection. No evidence of acute solid organ abnormality. No renal or ureteral calculi. No evidence of appendicitis. Electronically Signed   By: Franki Cabot M.D.   On: 03/30/2020 08:13    Procedures Procedures (including critical care time)  Medications Ordered in ED Medications  sodium chloride flush (NS) 0.9 % injection 3 mL (has no administration in time range)  sodium chloride 0.9 % bolus 1,000 mL (0 mLs Intravenous Stopped 03/30/20 0829)  morphine 4 MG/ML injection 4 mg (4 mg Intravenous Given 03/30/20 0726)  ondansetron (ZOFRAN) injection 4 mg (4 mg Intravenous Given 03/30/20 0724)    ED Course  I have reviewed the triage vital signs and the nursing notes.  Pertinent labs & imaging results that were available during my care of the patient were reviewed by me and considered in my medical decision making (see chart for details).    MDM Rules/Calculators/A&P                      BP (!) 148/66   Pulse 74   Temp (!) 97.3 F (36.3 C) (Oral)   Resp 14   Ht 5' (1.524 m)   SpO2 99%   BMI 28.90 kg/m   Final  Clinical Impression(s) / ED Diagnoses Final diagnoses:  Diarrhea, unspecified type  Suspected COVID-19 virus infection  Rx / DC Orders ED Discharge Orders         Ordered    benzonatate (TESSALON) 100 MG capsule  3 times daily PRN     03/30/20 1105         7:16 AM Patient here with abdominal Jorge [name] associated diarrhea for the past few days.  Was seen in the ED 3 days ago for for same and was thought that his symptoms are somewhat suspicious for H. pylori infection therefore patient was given triple therapy medication.  Due to his recurrent Jorge [name] and return to the ED will obtain abdominal pelvic CT scan for screening.  Patient otherwise has a fairly benign abdominal exam.  He is resting comfortably.  Vital signs and labs are reassuring.  I have low suspicion for C. difficile causing his diarrhea  11:03 AM COVID-19 test is currently pending.  However at this time patient is stable for discharge.  I did discuss care with patient using language interpreter and answered all questions appropriately.  Since patient is coughing the room, will prescribe cough medication.  Encourage patient to avoid taking the triple drug therapy for his suspected H. pylori as it may contribute to his current symptoms.  I discussed care with Dr. Vallery Ridge.  I did attempt to reach out to his family members over the phone but unable to get in touch with anyone.  I gave patient return precaution.  Sanjit Lacey Chaussee was evaluated in Emergency Jorge [name] on 03/30/2020 for the symptoms described in the history of present illness. He was evaluated in the context of the global COVID-19 pandemic, which necessitated consideration that the patient might be at risk for infection with the SARS-CoV-2 virus that causes COVID-19. Institutional protocols and algorithms that pertain to the evaluation of patients at risk for COVID-19 are in a state of rapid change based on information released by regulatory bodies including the CDC and federal and  state organizations. These policies and algorithms were followed during the patient's care in the ED.    Domenic Moras, PA-C 03/30/20 1107    Veryl Speak, MD 03/31/20 [phone]

## 2020-03-30 NOTE — ED Notes (Addendum)
Budhi Basford (son) can be contacted at [phone].

## 2020-03-30 NOTE — ED Notes (Signed)
Pt transported to CT ?

## 2020-03-30 NOTE — ED Triage Notes (Signed)
Pt arrives with c/o diarrhea with abdominal pain 8/10 that started this morning; pt states 3-4 times already; Pt also c/o of bilateral leg pain/burning for over a year. Pt has dry cough on arrival; it is noted that patient was seen here on 03/28/20 for the same; Pt c/o of itching in his bottom area; Patient speaks [place] and interpreter used-Monique,RN

## 2020-04-02 ENCOUNTER — Other Ambulatory Visit: Payer: Self-pay

## 2020-04-02 ENCOUNTER — Emergency Department (HOSPITAL_COMMUNITY)
Admission: EM | Admit: 2020-04-02 | Discharge: 2020-04-02 | Disposition: A | Discharge status: Home / Self Care | Payer: Medicare Other | Attending: Emergency Medicine | Admitting: Emergency Medicine

## 2020-04-02 ENCOUNTER — Encounter (HOSPITAL_COMMUNITY): Payer: Self-pay

## 2020-04-02 DIAGNOSIS — Z87891 Personal history of nicotine dependence: Secondary | ICD-10-CM | POA: Diagnosis not present

## 2020-04-02 DIAGNOSIS — I10 Essential (primary) hypertension: Secondary | ICD-10-CM | POA: Insufficient documentation

## 2020-04-02 DIAGNOSIS — Z79899 Other long term (current) drug therapy: Secondary | ICD-10-CM | POA: Insufficient documentation

## 2020-04-02 DIAGNOSIS — R112 Nausea with vomiting, unspecified: Secondary | ICD-10-CM

## 2020-04-02 DIAGNOSIS — E119 Type 2 diabetes mellitus without complications: Secondary | ICD-10-CM | POA: Insufficient documentation

## 2020-04-02 DIAGNOSIS — R1110 Vomiting, unspecified: Secondary | ICD-10-CM | POA: Diagnosis present

## 2020-04-02 LAB — URINALYSIS, ROUTINE W REFLEX MICROSCOPIC
Bacteria, UA: NONE SEEN
Bilirubin Urine: NEGATIVE
Glucose, UA: NEGATIVE mg/dL
Hgb urine dipstick: NEGATIVE
Ketones, ur: NEGATIVE mg/dL
Nitrite: NEGATIVE
Protein, ur: NEGATIVE mg/dL
Specific Gravity, Urine: 1.01 (ref 1.005–1.030)
pH: 7 (ref 5.0–8.0)

## 2020-04-02 LAB — COMPREHENSIVE METABOLIC PANEL
ALT: 37 U/L (ref 0–44)
AST: 43 U/L — ABNORMAL HIGH (ref 15–41)
Albumin: 4.1 g/dL (ref 3.5–5.0)
Alkaline Phosphatase: 85 U/L (ref 38–126)
Anion gap: 14 (ref 5–15)
BUN: 5 mg/dL — ABNORMAL LOW (ref 8–23)
CO2: 22 mmol/L (ref 22–32)
Calcium: 9.4 mg/dL (ref 8.9–10.3)
Chloride: 103 mmol/L (ref 98–111)
Creatinine, Ser: 0.91 mg/dL (ref 0.61–1.24)
GFR calc Af Amer: >60 mL/min (ref >60)
GFR calc non Af Amer: >60 mL/min (ref >60)
Glucose, Bld: 102 mg/dL — ABNORMAL HIGH (ref 70–99)
Potassium: 3.2 mmol/L — ABNORMAL LOW (ref 3.5–5.1)
Sodium: 139 mmol/L (ref 135–145)
Total Bilirubin: 1.7 mg/dL — ABNORMAL HIGH (ref 0.3–1.2)
Total Protein: 6.6 g/dL (ref 6.5–8.1)

## 2020-04-02 LAB — CBC
HCT: 42.7 % (ref 39.0–52.0)
Hemoglobin: 14.9 g/dL (ref 13.0–17.0)
MCH: 31 pg (ref 26.0–34.0)
MCHC: 34.9 g/dL (ref 30.0–36.0)
MCV: 89 fL (ref 80.0–100.0)
Platelets: 129 10*3/uL — ABNORMAL LOW (ref 150–400)
RBC: 4.8 MIL/uL (ref 4.22–5.81)
RDW: 11.8 % (ref 11.5–15.5)
WBC: 5.3 10*3/uL (ref 4.0–10.5)
nRBC: 0 % (ref 0.0–0.2)

## 2020-04-02 LAB — LIPASE, BLOOD: Lipase: 19 U/L (ref 11–51)

## 2020-04-02 MED ORDER — ESOMEPRAZOLE MAGNESIUM 40 MG PO CPDR: 40.0000 mg | DELAYED_RELEASE_CAPSULE | Freq: Every day | ORAL | 0 refills | Status: DC

## 2020-04-02 MED ORDER — SODIUM CHLORIDE 0.9 % IV BOLUS
1000.0000 mL | Freq: Once | INTRAVENOUS | Status: AC
  Administered 2020-04-02: 1000 mL via INTRAVENOUS

## 2020-04-02 MED ORDER — ALUM & MAG HYDROXIDE-SIMETH 200-200-20 MG/5ML PO SUSP
30.0000 mL | Freq: Once | ORAL | Status: AC
  Administered 2020-04-02: 30 mL via ORAL
  Filled 2020-04-02: qty 30

## 2020-04-02 MED ORDER — ONDANSETRON 4 MG PO TBDP
4.0000 mg | ORAL_TABLET | Freq: Once | ORAL | Status: AC
  Administered 2020-04-02: 4 mg via ORAL
  Filled 2020-04-02: qty 1

## 2020-04-02 MED ORDER — SODIUM CHLORIDE 0.9% FLUSH
3.0000 mL | Freq: Once | INTRAVENOUS | Status: AC
  Administered 2020-04-02: 3 mL via INTRAVENOUS

## 2020-04-02 MED ORDER — LIDOCAINE VISCOUS HCL 2 % MT SOLN
15.0000 mL | Freq: Once | OROMUCOSAL | Status: AC
  Administered 2020-04-02: 15 mL via ORAL
  Filled 2020-04-02: qty 15

## 2020-04-02 NOTE — ED Provider Notes (Addendum)
[facility] EMERGENCY DEPARTMENT Provider Note   CSN: [id number] Arrival date & time: 04/02/20  0249     History Chief Complaint  Patient presents with  . Emesis    Jorge [name] is a 69 y.o. male.  HPI  69 year old male with a history of gastritis and a multitude of ER visits in the last 6 months for GI complaints presents to the ER with vomiting which started around 2 AM.  The patient's history was acquired through a [place] interpreter.  He reports that the vomiting began around 2 AM, with about 12-13 episodes of vomiting since then.  Last episode of vomiting was at 3 AM.  Patient reports nonbloody nonbilious emesis with associated abdominal pain which is chronic.  His last ER on 03/30/20 visit was due to episodes of diarrhea, which he states have since resolved.  He states he has not made any dietary changes, referring that he ate his typical rice and vegetables last night.  He endorses weakness, dizziness, midepigastric abdominal pain.  Last bowel movement was 3 days ago and was normal.  He states has been compliant with his Carafate.  Patient has had multiple work-ups for during his previous visits to the ER in the last 2 months including CT the abdomen and pelvis which did show a small hiatal hernia and mild nonspecific gastritis back and an abdominal aortic aneurysm with recommendations to recheck in 5 years on 03/01/2020. Repeat CT scan on 03/30/2020 showed free fluid throughout nondistended colon, consistent with diarrhea.  Ultrasound on 12/13/2019 showed liver echogenicity, likely hepatic steatosis. Other findings unremarkable.   These test were all done at [facility].  Upon reviewing his medical record, he has also had multiple CTs, ultrasounds, hepatobiliary with ejection fraction at [facility] in [place] throughout 2021 and 2020. He denies fevers, chest pain, shortness of breath, syncope, dysuria, hematuria. Rates the pain a 5/10.   I was able to speak to the  patient's daughter who was able to provide some additional history.  She states that they have been following with GI approximately 3 to 4 months ago, and that the patient has had a scope which did not show anything.  She states that they updated his medications but they do not seem to be helping and she cannot recall their name. She also cannot recall the name of the GI doctor who they have seen. She states that they are leaving for [place] in approximately 45 days which is why they have not followed up further.  Of note, I cannot locate any records of visits to gastroenterology in the patient's EMR.      Past Medical History:  Diagnosis Date  . Acid reflux   . Arthritis    right leg  . Cataract    bil removed  . Diabetes mellitus without complication ([facility])   . Hypertension     Patient Active Problem List   Diagnosis Date Noted  . Abdominal pain, epigastric 05/09/2017  . Lumbosacral spondylosis with radiculopathy 12/25/2016    Past Surgical History:  Procedure Laterality Date  . cataracts Bilateral   . LUMBAR LAMINECTOMY/DECOMPRESSION MICRODISCECTOMY Right 12/25/2016   Procedure: Lumbar three-five Laminectomy, Right Lumbar three-four, Lumbar four-five Diskectomy ;  Surgeon: Kevan Ny Ditty, MD;  Location: [facility];  Service: Neurosurgery;  Laterality: Right;       Family History  Problem Relation Age of Onset  . Colon cancer Neg Hx   . Esophageal cancer Neg Hx   .  Pancreatic cancer Neg Hx   . Prostate cancer Neg Hx   . Rectal cancer Neg Hx   . Stomach cancer Neg Hx     Social History   Tobacco Use  . Smoking status: Former Smoker    Packs/day: 0.25    Years: 40.00    Pack years: 10.00    Quit date: 12/17/2011    Years since quitting: 8.3  . Smokeless tobacco: Never Used  Substance Use Topics  . Alcohol use: No  . Drug use: No    Home Medications Prior to Admission medications   Medication Sig Start Date End Date Taking? Authorizing Provider  amLODipine  (NORVASC) 5 MG tablet Take 5 mg by mouth daily. 07/16/16   [provider]  atorvastatin (LIPITOR) 40 MG tablet Take 40 mg by mouth daily at 6 PM.    [provider]  benzonatate (TESSALON) 100 MG capsule Take 1 capsule (100 mg total) by mouth 3 (three) times daily as needed for cough. 03/30/20   Domenic Moras, PA-C  clarithromycin (BIAXIN) 500 MG tablet Take 1 tablet (500 mg total) by mouth 2 (two) times daily. 03/28/20   Khatri, Hina, PA-C  cyclobenzaprine (FLEXERIL) 10 MG tablet Take 1 tablet (10 mg total) by mouth 2 (two) times daily as needed for muscle spasms. 02/19/18   Domenic Moras, PA-C  dicyclomine (BENTYL) 20 MG tablet Take 20 mg by mouth 4 (four) times daily. 03/25/20   [provider]  esomeprazole (NEXIUM) 40 MG capsule Take 1 capsule (40 mg total) by mouth daily. 04/02/20   Garald Balding, PA-C  famotidine (PEPCID) 20 MG tablet Take 1 tablet (20 mg total) by mouth 2 (two) times daily. 09/05/17   Kirichenko, Tatyana, PA-C  gabapentin (NEURONTIN) 100 MG capsule Take 100 mg by mouth 2 (two) times daily. 08/23/17   [provider]  metroNIDAZOLE (FLAGYL) 500 MG tablet Take 1 tablet (500 mg total) by mouth 3 (three) times daily for 14 days. 03/28/20 04/11/20  Khatri, Hina, PA-C  omeprazole (PRILOSEC) 20 MG capsule Take 1 capsule (20 mg total) by mouth daily. Patient not taking: Reported on 03/28/2020 09/05/17   Jeannett Senior, PA-C  pantoprazole (PROTONIX) 20 MG tablet Take 1 tablet (20 mg total) by mouth daily. 03/28/20   Khatri, Hina, PA-C  polyethylene glycol powder (MIRALAX) powder Take 17 g by mouth daily. 09/25/17   Jola Schmidt, MD  sucralfate (CARAFATE) 1 g tablet Take 1 tablet (1 g total) by mouth 4 (four) times daily. 12/13/19 03/28/20  Bonnita Hollow, MD  traMADol (ULTRAM) 50 MG tablet Take 1 tablet (50 mg total) by mouth 2 (two) times daily as needed for moderate pain or severe pain. 02/19/18   Domenic Moras, PA-C    Allergies    Ivp dye [iodinated diagnostic  agents]  Review of Systems   Review of Systems  Constitutional: Positive for appetite change. Negative for chills, fatigue and fever.  Respiratory: Negative for chest tightness and shortness of breath.   Cardiovascular: Negative for chest pain and palpitations.  Gastrointestinal: Positive for abdominal pain, nausea and vomiting. Negative for constipation and diarrhea.  Genitourinary: Negative for difficulty urinating, dysuria, frequency, hematuria and testicular pain.  Musculoskeletal: Negative for back pain and myalgias.  Neurological: Positive for weakness. Negative for dizziness, syncope and light-headedness.  Psychiatric/Behavioral: Negative for confusion.    Physical Exam Updated Vital Signs BP 135/63   Pulse 62   Temp (!) 97.5 F (36.4 C) (Oral)   Resp 18  SpO2 99%   Physical Exam Vitals reviewed.  Constitutional:      Appearance: Normal appearance.  HENT:     Head: Normocephalic and atraumatic.  Eyes:     General:        Right eye: No discharge.        Left eye: No discharge.     Extraocular Movements: Extraocular movements intact.     Conjunctiva/sclera: Conjunctivae normal.  Cardiovascular:     Rate and Rhythm: Normal rate and regular rhythm.     Pulses: Normal pulses.     Heart sounds: Normal heart sounds.  Pulmonary:     Effort: Pulmonary effort is normal.     Breath sounds: Normal breath sounds.  Abdominal:     General: Abdomen is flat. Bowel sounds are increased. There is no distension.     Palpations: Abdomen is soft. There is no mass.     Tenderness: There is abdominal tenderness. There is no right CVA tenderness, left CVA tenderness, guarding or rebound. Negative signs include Murphy's sign.     Hernia: No hernia is present.     Comments: Midepigastric tenderness to palpation  Musculoskeletal:        General: No swelling. Normal range of motion.  Skin:    General: Skin is warm and dry.  Neurological:     General: No focal deficit present.      Mental Status: He is alert and oriented to person, place, and time.  Psychiatric:        Mood and Affect: Mood normal.        Behavior: Behavior normal.     ED Results / Procedures / Treatments   Labs (all labs ordered are listed, but only abnormal results are displayed) Labs Reviewed  COMPREHENSIVE METABOLIC PANEL - Abnormal; Notable for the following components:      Result Value   Potassium 3.2 (*)    Glucose, Bld 102 (*)    BUN 5 (*)    AST 43 (*)    Total Bilirubin 1.7 (*)    All other components within normal limits  CBC - Abnormal; Notable for the following components:   Platelets 129 (*)    All other components within normal limits  URINALYSIS, ROUTINE W REFLEX MICROSCOPIC - Abnormal; Notable for the following components:   Leukocytes,Ua TRACE (*)    All other components within normal limits  URINE CULTURE  LIPASE, BLOOD    EKG None  Radiology No results found.  Procedures Procedures (including critical care time)  Medications Ordered in ED Medications  sodium chloride flush (NS) 0.9 % injection 3 mL (3 mLs Intravenous Given 04/02/20 0939)  ondansetron (ZOFRAN-ODT) disintegrating tablet 4 mg (4 mg Oral Given 04/02/20 0314)  sodium chloride 0.9 % bolus 1,000 mL (0 mLs Intravenous Stopped 04/02/20 1031)  alum & mag hydroxide-simeth (MAALOX/MYLANTA) 200-200-20 MG/5ML suspension 30 mL (30 mLs Oral Given 04/02/20 0942)    And  lidocaine (XYLOCAINE) 2 % viscous mouth solution 15 mL (15 mLs Oral Given 04/02/20 [id number])    ED Course  I have reviewed the triage vital signs and the nursing notes.  Pertinent labs & imaging results that were available during my care of the patient were reviewed by me and considered in my medical decision making (see chart for details).    MDM Rules/Calculators/A&P                     69 year old male with an extensive history of GI complaints  presents to the ER with multiple episodes of vomiting occurring around 2 AM this morning. History and  physical exam and all communication was performed through a interpreter. Presented to the ER patient is well-appearing, nontoxic, nondiaphoretic, no acute distress.  Not actively vomiting.  Mildly hypertensive, other vitals unremarkable.  Afebrile.  Physical exam shows diffuse epigastric pain to palpation, negative Murphy's.  No lower abdominal tenderness.  No chest pain, heart sounds normal, no shortness of breath or tachypnea. CBC without leukocytosis, CMP with mildly decreased potassium and BUN, likely secondary to dehydration.  AST slight elevated at 43, likely not clinically significant.  Total bilirubin elevated to 1.7 which appears to be at baseline from previous visits.  Lipase normal, doubt pancreatitis.  Patient has had an extensive GI work-up at previous visits, most recent visit was 3 days ago where he received a CT of the abdomen which was negative for any acute pathology (see HPI for further details recount of all of his previous scans/tests). Given extensive thorough GI work-up over the last 2 months by ER providers, I do not think that further imaging is indicated at this time.  The daughter also related that the patient had a what sounds like a scope done 3 to 4 months ago which was negative.  Doubt perforated ulcer given patient is nontoxic-appearing, normotensive, not actively vomiting, no bloody stools . Doubt incarcerated hernia due to no red flag signs and small etiology.   Differential diagnosis of epigastric pain includes: Functional or nonulcer dyspepsia, PUD, GERD, Gastritis, (NSAIDs, alcohol, stress, H. pylori, pernicious anemia), pancreatitis or pancreatic cancer, overeating indigestion (high-fat foods, coffee), drugs (aspirin, antibiotics (eg, macrolides, metronidazole), corticosteroids, digoxin, narcotics, theophylline), gastroparesis, lactose intolerance, malabsorption gastric cancer, parasitic infection, (Giardia, Strongyloides, Ascaris) cholelithiasis, choledocholithiasis, or  cholangitis, ACS, pericarditis, pneumonia, abdominal hernia, pregnancy, intestinal ischemia, esophageal rupture, gastric volvulus, hepatitis.   Patient received a fluid bolus and GI cocktail with Zofran and notes improvement of symptoms but lingering mild epigastric abdominal pain.  Tolerating oral fluids well.  The patient was given Nexium at his previous visits, I will prescribe a another course of this.  Stressed further GI follow-up to patient and daughter. Referral sent. Return precautions given. I discussed this plan with the patient and daughter and they are agreeable to this plan.   The patient was seen by Dr. Sabra Heck and he is agreeable to this plan  Final Clinical  Impression(s) / ED Diagnoses Final diagnoses:  Non-intractable vomiting with nausea, unspecified vomiting type    Rx / DC Orders ED Discharge Orders         Ordered    esomeprazole (NEXIUM) 40 MG capsule  Daily     04/02/20 1133           Garald Balding, PA-C 04/02/20 1218    Garald Balding, PA-C 04/03/20 1230    Noemi Chapel, MD 04/05/20 1145

## 2020-04-02 NOTE — ED Triage Notes (Signed)
Pt here for vomiting and abd pain since yesterday, epigastric pain, denies diarrhea, has been here twice this week for the same

## 2020-04-02 NOTE — Discharge Instructions (Addendum)
Please take the prescribe medication called esomeprazole which is a proton pump inhibitor which can help with your mid stomach pain. Please take one pill before food in the morning. Return to the ER if your symptoms get worse. I have provided a phone number for a GI doctor here in [place] for you to follow up with

## 2020-04-02 NOTE — ED Notes (Signed)
Pt dc'd by another RN d/c instructions thoroughly discussed by PA using interpreter

## 2020-04-03 LAB — URINE CULTURE: Culture: NO GROWTH

## 2020-08-31 IMAGING — CT CT ABD-PELV W/O CM
2 of 4 series · 16 of 46 positions shown, 18 images · non-contrast
Comparison: CT abdomen dated 03/01/2020.

CLINICAL DATA: Diarrhea.

EXAM:
CT ABDOMEN AND PELVIS WITHOUT CONTRAST
TECHNIQUE: Multidetector CT imaging of the abdomen and pelvis was performed
following the standard protocol without IV contrast.

[Series 3: abd/ pelvis 5.0 i30f 2 · axial · 0.71mm/px · z∈[+728,+1168]mm · 13 of 98 slices shown, 15 images]
[im 5/98  soft-tissue]
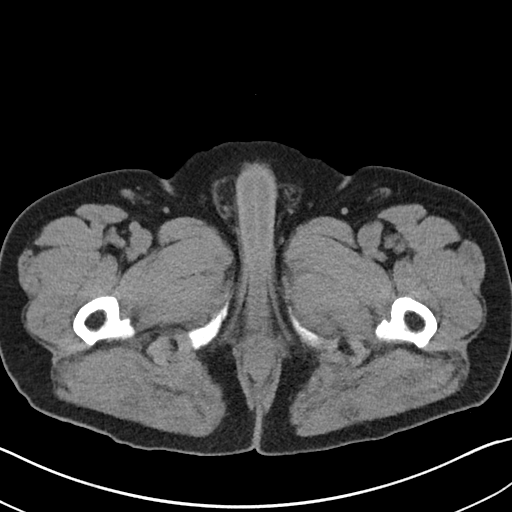
[im 5/98  bone]
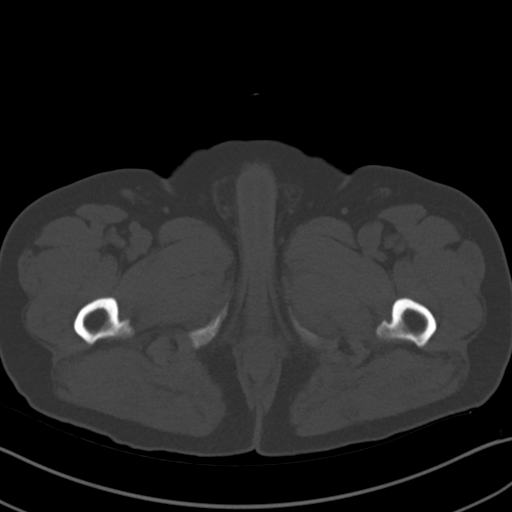
[im 14/98  soft-tissue]
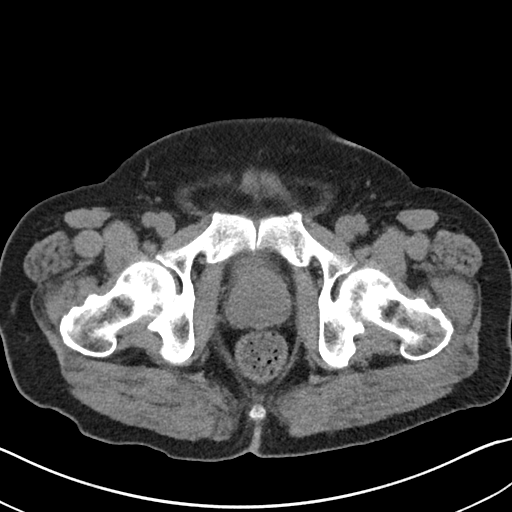
[im 23/98  soft-tissue]
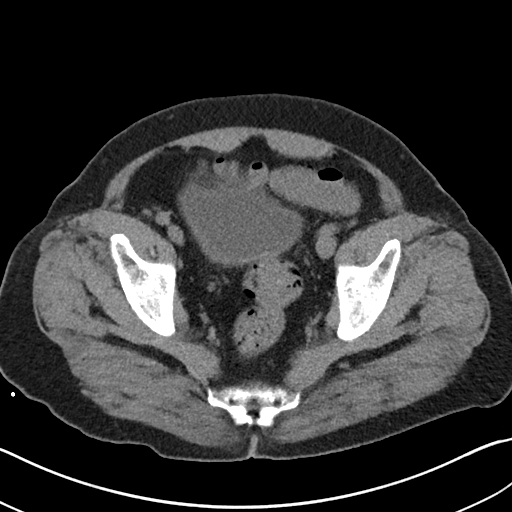
[im 27/98  soft-tissue]
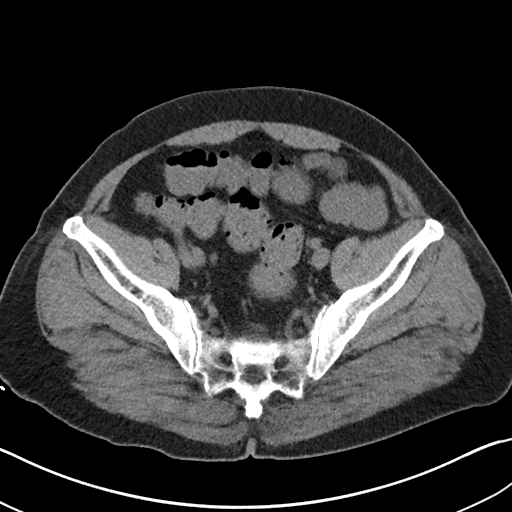
[im 36/98  soft-tissue]
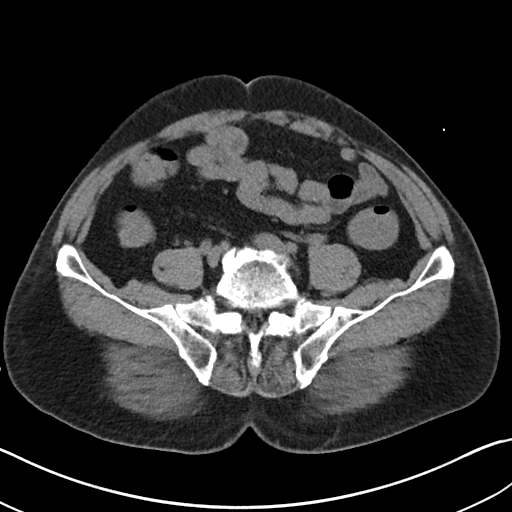
[im 40/98  soft-tissue]
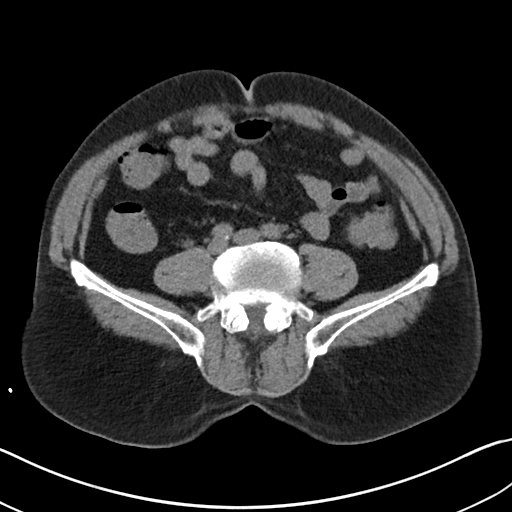
[im 49/98  soft-tissue]
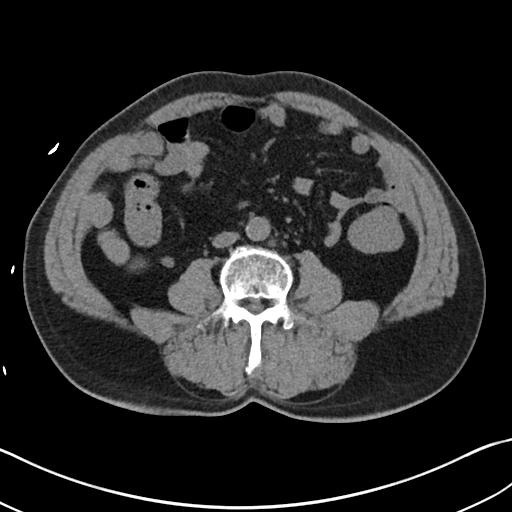
[im 58/98  soft-tissue]
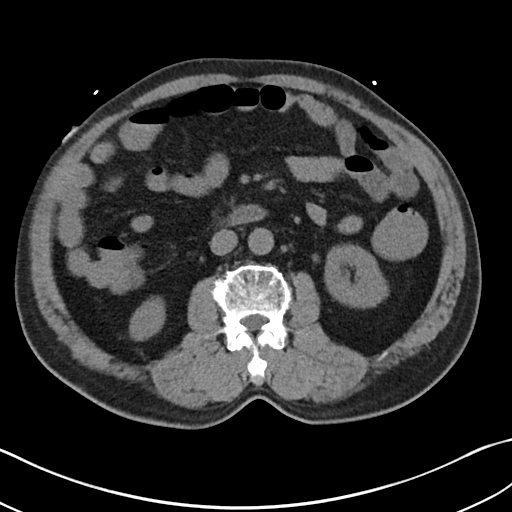
[im 62/98  soft-tissue]
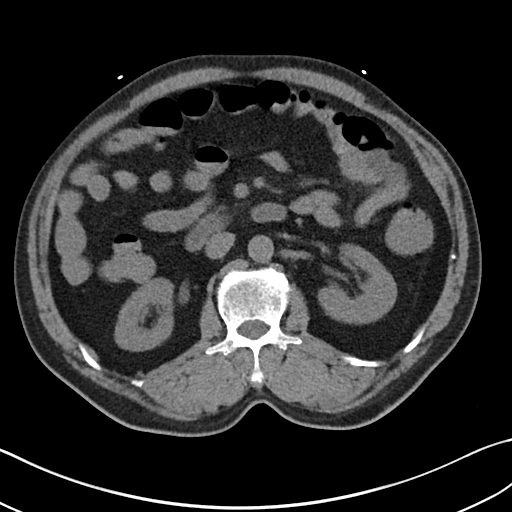
[im 62/98  bone]
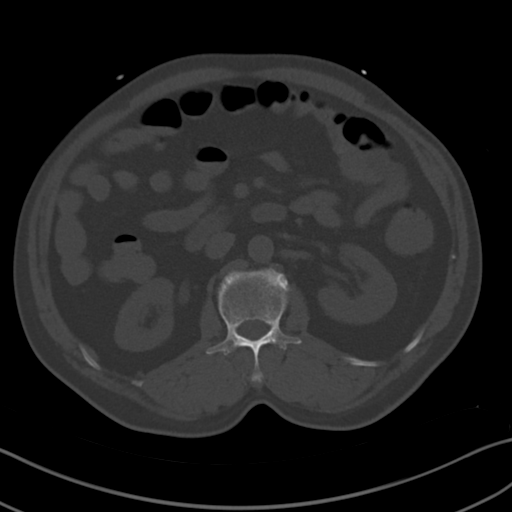
[im 71/98  soft-tissue]
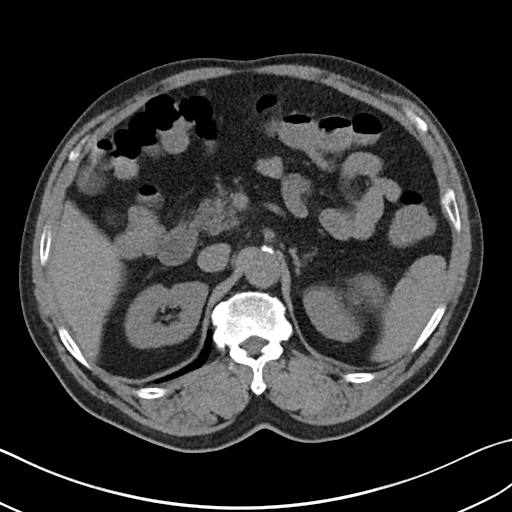
[im 75/98  soft-tissue]
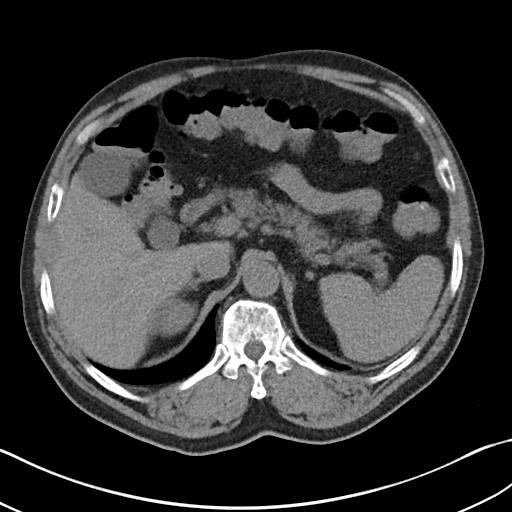
[im 84/98  soft-tissue]
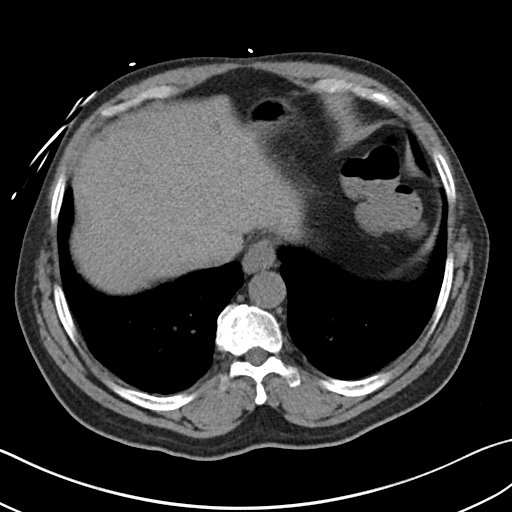
[im 93/98  soft-tissue]
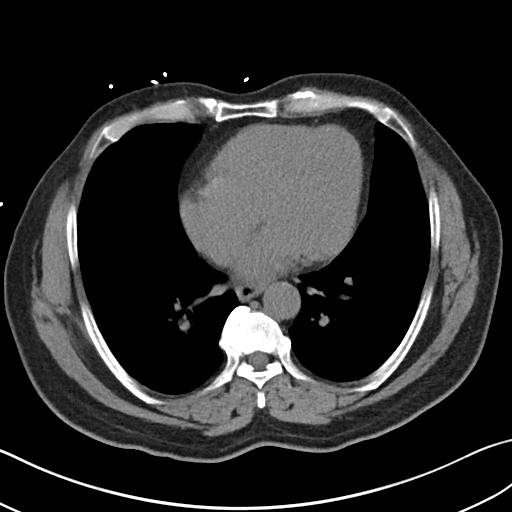

[Series 6: cor st · coronal · 0.78mm/px · 3 of 99 slices shown]
[im 33/99  soft-tissue]
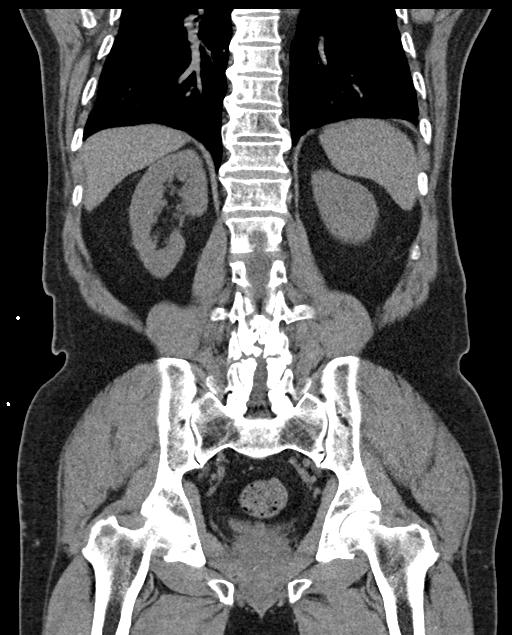
[im 44/99  soft-tissue]
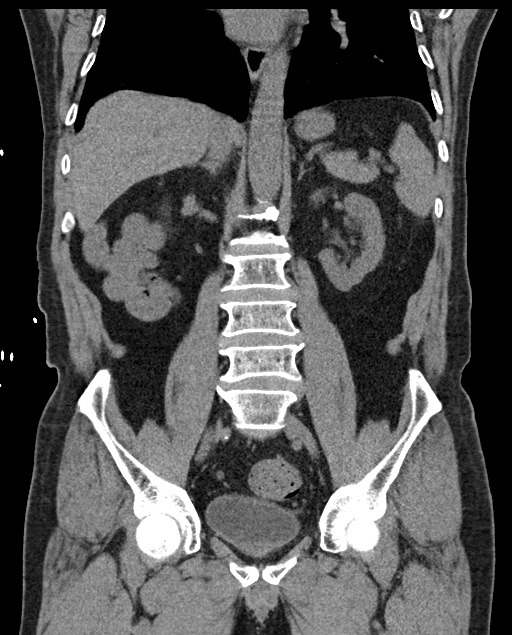
[im 55/99  soft-tissue]
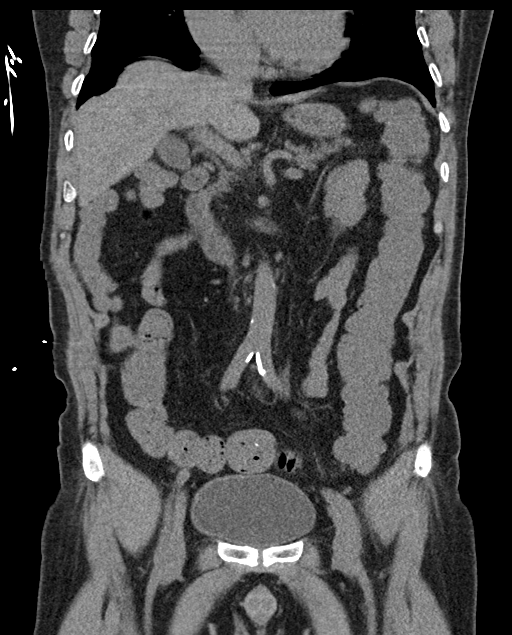

[16 of 46 positions shown; findings below may reference images not displayed]

FINDINGS: Lower chest: No acute abnormality.

Hepatobiliary: No focal liver abnormality is seen. No gallstones,
gallbladder wall thickening, or biliary dilatation.

Pancreas: Unremarkable. No pancreatic ductal dilatation or
surrounding inflammatory changes.

Spleen: Normal in size without focal abnormality.

Adrenals/Urinary Tract: Adrenal glands are unremarkable. Kidneys are
normal, without renal calculi, focal lesion, or hydronephrosis.
Bladder is unremarkable.

Stomach/Bowel: No dilated large or small bowel loops. Fluid is seen
throughout the colon and small bowel compatible with the given
history of diarrhea. Moderate amount of stool in the rectosigmoid
colon. Stomach is unremarkable, partially decompressed. Appendix is
normal.

Vascular/Lymphatic: Aortic atherosclerosis. No enlarged lymph nodes
seen.

Reproductive: Prostate is unremarkable.

Other: No free fluid or abscess collection. No free intraperitoneal
air.

Musculoskeletal: Degenerative spondylosis of the lumbar spine, mild
to moderate in degree. Surgical changes of central canal
decompression at the L4 and L5 vertebral body levels. No acute or
suspicious osseous finding.
IMPRESSION: 1. Fluid throughout the nondistended colon and small bowel,
compatible with the given history of diarrhea. Moderate amount of
stool in the rectosigmoid colon. No evidence of bowel obstruction.
No bowel wall thickening, pericolonic inflammation or other
secondary signs of active bowel wall inflammation.
2. Remainder of the abdomen and pelvis CT is unremarkable, as
detailed above. No free fluid or abscess collection. No evidence of
acute solid organ abnormality. No renal or ureteral calculi. No
evidence of appendicitis.

## 2020-12-01 ENCOUNTER — Emergency Department (HOSPITAL_COMMUNITY): Payer: Medicare Other

## 2020-12-01 ENCOUNTER — Emergency Department (HOSPITAL_COMMUNITY)
Admission: EM | Admit: 2020-12-01 | Discharge: 2020-12-01 | Disposition: A | Discharge status: Home / Self Care | Payer: Medicare Other | Attending: Emergency Medicine | Admitting: Emergency Medicine

## 2020-12-01 ENCOUNTER — Other Ambulatory Visit: Payer: Self-pay

## 2020-12-01 ENCOUNTER — Encounter (HOSPITAL_COMMUNITY): Payer: Self-pay | Admitting: Emergency Medicine

## 2020-12-01 DIAGNOSIS — I10 Essential (primary) hypertension: Secondary | ICD-10-CM | POA: Diagnosis not present

## 2020-12-01 DIAGNOSIS — E119 Type 2 diabetes mellitus without complications: Secondary | ICD-10-CM | POA: Insufficient documentation

## 2020-12-01 DIAGNOSIS — Z79899 Other long term (current) drug therapy: Secondary | ICD-10-CM | POA: Diagnosis not present

## 2020-12-01 DIAGNOSIS — Z87891 Personal history of nicotine dependence: Secondary | ICD-10-CM | POA: Insufficient documentation

## 2020-12-01 DIAGNOSIS — R1011 Right upper quadrant pain: Secondary | ICD-10-CM | POA: Diagnosis present

## 2020-12-01 LAB — COMPREHENSIVE METABOLIC PANEL
ALT: 28 U/L (ref 0–44)
AST: 27 U/L (ref 15–41)
Albumin: 4.2 g/dL (ref 3.5–5.0)
Alkaline Phosphatase: 104 U/L (ref 38–126)
Anion gap: 12 (ref 5–15)
BUN: 5 mg/dL — ABNORMAL LOW (ref 8–23)
CO2: 22 mmol/L (ref 22–32)
Calcium: 9.4 mg/dL (ref 8.9–10.3)
Chloride: 106 mmol/L (ref 98–111)
Creatinine, Ser: 0.82 mg/dL (ref 0.61–1.24)
GFR, Estimated: >60 mL/min (ref >60)
Glucose, Bld: 110 mg/dL — ABNORMAL HIGH (ref 70–99)
Potassium: 3.6 mmol/L (ref 3.5–5.1)
Sodium: 140 mmol/L (ref 135–145)
Total Bilirubin: 1.8 mg/dL — ABNORMAL HIGH (ref 0.3–1.2)
Total Protein: 6.8 g/dL (ref 6.5–8.1)

## 2020-12-01 LAB — URINALYSIS, ROUTINE W REFLEX MICROSCOPIC
Bilirubin Urine: NEGATIVE
Glucose, UA: NEGATIVE mg/dL
Hgb urine dipstick: NEGATIVE
Ketones, ur: NEGATIVE mg/dL
Leukocytes,Ua: NEGATIVE
Nitrite: NEGATIVE
Protein, ur: NEGATIVE mg/dL
Specific Gravity, Urine: 1.005 (ref 1.005–1.030)
pH: 7 (ref 5.0–8.0)

## 2020-12-01 LAB — CBC
HCT: 45.9 % (ref 39.0–52.0)
Hemoglobin: 15.7 g/dL (ref 13.0–17.0)
MCH: 30.9 pg (ref 26.0–34.0)
MCHC: 34.2 g/dL (ref 30.0–36.0)
MCV: 90.4 fL (ref 80.0–100.0)
Platelets: 158 10*3/uL (ref 150–400)
RBC: 5.08 MIL/uL (ref 4.22–5.81)
RDW: 12.1 % (ref 11.5–15.5)
WBC: 6.7 10*3/uL (ref 4.0–10.5)
nRBC: 0 % (ref 0.0–0.2)

## 2020-12-01 LAB — LIPASE, BLOOD: Lipase: 21 U/L (ref 11–51)

## 2020-12-01 NOTE — ED Notes (Signed)
Patient transported to Ultrasound via stretcher with transport tech and son.

## 2020-12-01 NOTE — ED Notes (Signed)
Dc teaching done with son and patient. Patient following up with his PCP. Verbalized understanding of s/sx to return. No distress noted. Ambulated to the ED exit with steady and even gait.

## 2020-12-01 NOTE — ED Notes (Signed)
Patient received covid vaccine booster number 3 yesterday and last night started with right upper abd pain that lasts about 5-10 minutes. Denies nausea and diarrhea. Hx of DM, but BG well controlled at home. He is alert, oriented and no distress at this time. Abd soft and no guarding present.

## 2020-12-01 NOTE — ED Triage Notes (Signed)
Patient reports right abdominal pain onset yesterday , no emesis or diarrhea , denies fever or chills .

## 2020-12-01 NOTE — Discharge Instructions (Signed)
Your tests were normal See your doctor for follow up in the next week ER for worsening symptoms Continue you omeprazole

## 2020-12-01 NOTE — ED Provider Notes (Signed)
[facility] EMERGENCY DEPARTMENT Provider Note   CSN: [id number] Arrival date & time: 12/01/20  9024     History Chief Complaint  Patient presents with  . Abdominal Pain    Jorge [name] is a 69 y.o. male.  HPI   This patient is a 69 year old male, he is from [place], his son is at the bedside and assists with translation.  The patient has never had any abdominal surgery, he has had some lumbosacral surgery in the past, he is a known diabetic, he does have hypertension and acid reflux.  The patient reports to me through the use of the translator that he has had intermittent pain in his upper abdomen mostly the right upper abdomen which is a burning pain, it occasionally occurs in his bilateral legs as well, it seems to come and go over time and he has struggled with this for approximately 5 years.  There is nothing specifically that brings it on though he notes that when he is hungry it gets worse and when he eats it gets better, it seems to get worse when he walks the dog and worse when he lays down but it does not happen every time.  He does not get nauseated or vomit and he does not get any jaundice.  Occasionally has some dysuria but does not struggle with any other gastrointestinal symptoms including diarrhea or constipation.  He has not had any medications for this and has not been seen by a physician for this complaint up to this point.  Of note the patient did his Covid vaccine yesterday, he states this is not a new complaint since then but has struggled with this over time.  Past Medical History:  Diagnosis Date  . Acid reflux   . Arthritis    right leg  . Cataract    bil removed  . Diabetes mellitus without complication ([place])   . Hypertension     Patient Active Problem List   Diagnosis Date Noted  . Abdominal pain, epigastric 05/09/2017  . Lumbosacral spondylosis with radiculopathy 12/25/2016    Past Surgical History:  Procedure Laterality Date  .  cataracts Bilateral   . LUMBAR LAMINECTOMY/DECOMPRESSION MICRODISCECTOMY Right 12/25/2016   Procedure: Lumbar three-five Laminectomy, Right Lumbar three-four, Lumbar four-five Diskectomy ;  Surgeon: Kevan Ny Ditty, MD;  Location: [facility];  Service: Neurosurgery;  Laterality: Right;       Family History  Problem Relation Age of Onset  . Colon cancer Neg Hx   . Esophageal cancer Neg Hx   . Pancreatic cancer Neg Hx   . Prostate cancer Neg Hx   . Rectal cancer Neg Hx   . Stomach cancer Neg Hx     Social History   Tobacco Use  . Smoking status: Former Smoker    Packs/day: 0.25    Years: 40.00    Pack years: 10.00    Quit date: 12/17/2011    Years since quitting: 8.9  . Smokeless tobacco: Never Used  Vaping Use  . Vaping Use: Never used  Substance Use Topics  . Alcohol use: No  . Drug use: No    Home Medications Prior to Admission medications   Medication Sig Start Date End Date Taking? Authorizing Provider  amLODipine (NORVASC) 5 MG tablet Take 5 mg by mouth daily. 07/16/16   [provider]  atorvastatin (LIPITOR) 40 MG tablet Take 40 mg by mouth daily at 6 PM.    [provider]  benzonatate (TESSALON)  100 MG capsule Take 1 capsule (100 mg total) by mouth 3 (three) times daily as needed for cough. 03/30/20   Domenic Moras, PA-C  clarithromycin (BIAXIN) 500 MG tablet Take 1 tablet (500 mg total) by mouth 2 (two) times daily. 03/28/20   Khatri, Hina, PA-C  cyclobenzaprine (FLEXERIL) 10 MG tablet Take 1 tablet (10 mg total) by mouth 2 (two) times daily as needed for muscle spasms. 02/19/18   Domenic Moras, PA-C  dicyclomine (BENTYL) 20 MG tablet Take 20 mg by mouth 4 (four) times daily. 03/25/20   [provider]  esomeprazole (NEXIUM) 40 MG capsule Take 1 capsule (40 mg total) by mouth daily. 04/02/20   Garald Balding, PA-C  famotidine (PEPCID) 20 MG tablet Take 1 tablet (20 mg total) by mouth 2 (two) times daily. 09/05/17   Kirichenko, Tatyana, PA-C    gabapentin (NEURONTIN) 100 MG capsule Take 100 mg by mouth 2 (two) times daily. 08/23/17   [provider]  omeprazole (PRILOSEC) 20 MG capsule Take 1 capsule (20 mg total) by mouth daily. Patient not taking: Reported on 03/28/2020 09/05/17   Jeannett Senior, PA-C  pantoprazole (PROTONIX) 20 MG tablet Take 1 tablet (20 mg total) by mouth daily. 03/28/20   Khatri, Hina, PA-C  polyethylene glycol powder (MIRALAX) powder Take 17 g by mouth daily. 09/25/17   Jola Schmidt, MD  sucralfate (CARAFATE) 1 g tablet Take 1 tablet (1 g total) by mouth 4 (four) times daily. 12/13/19 03/28/20  Bonnita Hollow, MD  traMADol (ULTRAM) 50 MG tablet Take 1 tablet (50 mg total) by mouth 2 (two) times daily as needed for moderate pain or severe pain. 02/19/18   Domenic Moras, PA-C    Allergies    Ivp dye [iodinated diagnostic agents]  Review of Systems   Review of Systems  All other systems reviewed and are negative.   Physical Exam Updated Vital Signs BP (!) 141/80   Pulse (!) 57   Temp 98.1 F (36.7 C) (Oral)   Resp 15   Ht 1.549 m (5\' 1" )   Wt 72 kg   SpO2 98%   BMI 29.99 kg/m   Physical Exam Vitals and nursing note reviewed.  Constitutional:      General: He is not in acute distress.    Appearance: He is well-developed.  HENT:     Head: Normocephalic and atraumatic.     Mouth/Throat:     Pharynx: No oropharyngeal exudate.  Eyes:     General: No scleral icterus.       Right eye: No discharge.        Left eye: No discharge.     Conjunctiva/sclera: Conjunctivae normal.     Pupils: Pupils are equal, round, and reactive to light.  Neck:     Thyroid: No thyromegaly.     Vascular: No JVD.  Cardiovascular:     Rate and Rhythm: Normal rate and regular rhythm.     Heart sounds: Normal heart sounds. No murmur heard.  No friction rub. No gallop.   Pulmonary:     Effort: Pulmonary effort is normal. No respiratory distress.     Breath sounds: Normal breath sounds. No wheezing or rales.   Abdominal:     General: Bowel sounds are normal. There is no distension.     Palpations: Abdomen is soft. There is no mass.     Tenderness: There is abdominal tenderness.     Comments: The patient has mild tenderness to palpation diffusely through the  abdomen but a very soft abdomen without any guarding.  He seems to be more tender in the right upper quadrant  Musculoskeletal:        General: No tenderness. Normal range of motion.     Cervical back: Normal range of motion and neck supple.  Lymphadenopathy:     Cervical: No cervical adenopathy.  Skin:    General: Skin is warm and dry.     Findings: No erythema or rash.  Neurological:     Mental Status: He is alert.     Coordination: Coordination normal.  Psychiatric:        Behavior: Behavior normal.     ED Results / Procedures / Treatments   Labs (all labs ordered are listed, but only abnormal results are displayed) Labs Reviewed  COMPREHENSIVE METABOLIC PANEL - Abnormal; Notable for the following components:      Result Value   Glucose, Bld 110 (*)    BUN 5 (*)    Total Bilirubin 1.8 (*)    All other components within normal limits  URINALYSIS, ROUTINE W REFLEX MICROSCOPIC - Abnormal; Notable for the following components:   Color, Urine STRAW (*)    All other components within normal limits  LIPASE, BLOOD  CBC    EKG None  Radiology US Abdomen Complete  Result Date: 12/01/2020 CLINICAL DATA:  Right upper quadrant pain. EXAM: ABDOMEN ULTRASOUND COMPLETE COMPARISON:  Abdomen/pelvis CT 03/30/2020 FINDINGS: Gallbladder: No gallstones or gallbladder wall thickening. No pericholecystic fluid. The sonographer reports no sonographic Murphy's sign. Common bile duct: Diameter: Not visualized due to overlying bowel gas. Liver: Diffusely increased echotexture compatible with fatty deposition. 9 mm cyst identified left liver. Portal vein is patent on color Doppler imaging with normal direction of blood flow towards the liver. IVC:  No abnormality visualized. Pancreas: Visualized portion unremarkable although portions of the head and tail are not visible. Spleen: Size and appearance within normal limits. Right Kidney: Length: 9.9. Echogenicity within normal limits. No mass or hydronephrosis visualized. Left Kidney: Length: 10.3. Echogenicity within normal limits. No mass or hydronephrosis visualized. Abdominal aorta: No aneurysm visualized. Other findings: None. IMPRESSION: Echogenic liver parenchyma suggests steatosis. Otherwise unremarkable exam. Electronically Signed   By: Misty Stanley M.D.   On: 12/01/2020 09:52    Procedures Procedures (including critical care time)  Medications Ordered in ED Medications - No data to display  ED Course  I have reviewed the triage vital signs and the nursing notes.  Pertinent labs & imaging results that were available during my care of the patient were reviewed by me and considered in my medical decision making (see chart for details).    MDM Rules/Calculators/A&P                          This patient is not jaundiced, his vital signs are unremarkable, see below.  His urinalysis is negative, his lipase CBC and metabolic panels are all normal, this shows normal renal function liver function and a normal lipase.  He has no leukocytosis.  Will obtain a right upper quadrant ultrasound to look for cholelithiasis though at this time the patient symptoms are vague in the burning sensation that he describes in both his abdomen his legs may be more of a neuropathy.  As he is a diabetic.  Patient is agreeable to the plan.  [place] neg - stable for d/c on PPI.  Final Clinical Impression(s) / ED Diagnoses Final diagnoses:  Right upper quadrant  pain    Rx / DC Orders ED Discharge Orders    None       Noemi Chapel, MD 12/01/20 1043

## 2021-03-28 ENCOUNTER — Other Ambulatory Visit: Payer: Self-pay

## 2021-03-28 ENCOUNTER — Encounter (HOSPITAL_COMMUNITY): Payer: Self-pay

## 2021-03-28 ENCOUNTER — Emergency Department (HOSPITAL_COMMUNITY)
Admission: EM | Admit: 2021-03-28 | Discharge: 2021-03-28 | Disposition: A | Discharge status: Home / Self Care | Payer: Medicare Other | Attending: Emergency Medicine | Admitting: Emergency Medicine

## 2021-03-28 DIAGNOSIS — Z87891 Personal history of nicotine dependence: Secondary | ICD-10-CM | POA: Diagnosis not present

## 2021-03-28 DIAGNOSIS — R109 Unspecified abdominal pain: Secondary | ICD-10-CM | POA: Insufficient documentation

## 2021-03-28 DIAGNOSIS — Z79899 Other long term (current) drug therapy: Secondary | ICD-10-CM | POA: Diagnosis not present

## 2021-03-28 DIAGNOSIS — E119 Type 2 diabetes mellitus without complications: Secondary | ICD-10-CM | POA: Diagnosis not present

## 2021-03-28 DIAGNOSIS — I10 Essential (primary) hypertension: Secondary | ICD-10-CM | POA: Diagnosis not present

## 2021-03-28 DIAGNOSIS — R339 Retention of urine, unspecified: Secondary | ICD-10-CM

## 2021-03-28 DIAGNOSIS — R102 Pelvic and perineal pain: Secondary | ICD-10-CM

## 2021-03-28 LAB — URINALYSIS, ROUTINE W REFLEX MICROSCOPIC
Bacteria, UA: NONE SEEN
Bilirubin Urine: NEGATIVE
Glucose, UA: NEGATIVE mg/dL
Hgb urine dipstick: NEGATIVE
Ketones, ur: NEGATIVE mg/dL
Leukocytes,Ua: NEGATIVE
Nitrite: POSITIVE — AB
Protein, ur: NEGATIVE mg/dL
Specific Gravity, Urine: 1.002 — ABNORMAL LOW (ref 1.005–1.030)
pH: 7 (ref 5.0–8.0)

## 2021-03-28 LAB — CBC
HCT: 42.1 % (ref 39.0–52.0)
Hemoglobin: 15.1 g/dL (ref 13.0–17.0)
MCH: 32.7 pg (ref 26.0–34.0)
MCHC: 35.9 g/dL (ref 30.0–36.0)
MCV: 91.1 fL (ref 80.0–100.0)
Platelets: 191 10*3/uL (ref 150–400)
RBC: 4.62 MIL/uL (ref 4.22–5.81)
RDW: 13.1 % (ref 11.5–15.5)
WBC: 4.6 10*3/uL (ref 4.0–10.5)
nRBC: 0 % (ref 0.0–0.2)

## 2021-03-28 LAB — COMPREHENSIVE METABOLIC PANEL
ALT: 21 U/L (ref 0–44)
AST: 23 U/L (ref 15–41)
Albumin: 4 g/dL (ref 3.5–5.0)
Alkaline Phosphatase: 97 U/L (ref 38–126)
Anion gap: 6 (ref 5–15)
BUN: 5 mg/dL — ABNORMAL LOW (ref 8–23)
CO2: 22 mmol/L (ref 22–32)
Calcium: 9.2 mg/dL (ref 8.9–10.3)
Chloride: 109 mmol/L (ref 98–111)
Creatinine, Ser: 0.81 mg/dL (ref 0.61–1.24)
GFR, Estimated: >60 mL/min (ref >60)
Glucose, Bld: 105 mg/dL — ABNORMAL HIGH (ref 70–99)
Potassium: 3.7 mmol/L (ref 3.5–5.1)
Sodium: 137 mmol/L (ref 135–145)
Total Bilirubin: 1.4 mg/dL — ABNORMAL HIGH (ref 0.3–1.2)
Total Protein: 6.9 g/dL (ref 6.5–8.1)

## 2021-03-28 LAB — LIPASE, BLOOD: Lipase: 27 U/L (ref 11–51)

## 2021-03-28 MED ORDER — ACETAMINOPHEN 500 MG PO TABS
1000.0000 mg | ORAL_TABLET | Freq: Once | ORAL | Status: AC
  Administered 2021-03-28: 1000 mg via ORAL
  Filled 2021-03-28: qty 2

## 2021-03-28 NOTE — ED Notes (Signed)
Pt and daughter educated on catheter care. States understanding of importance of follow up with urology.

## 2021-03-28 NOTE — ED Notes (Signed)
Pt used that bathroom before bladder scan, states he still has urge to urinate but only a small amount comes out Bladder scan shows 487mL, pt ambulatory to restroom to attempt to go again.

## 2021-03-28 NOTE — Discharge Instructions (Addendum)
It was our pleasure to provide your ER care today - we hope that you feel better.  Take acetaminophen or ibuprofen as need for pain.  Empty leg bag as need (the urine catheter has a balloon in your bladder - do not attempt to remove catheter without balloon being deflated).    Follow up with urologist in the coming week - call office this AM to arrange appointment. Leave catheter in until follow up there early this coming week.   Return to ER if worse, new symptoms, fevers, worsening or severe pain, persistent vomiting, unable to void, or other concern.

## 2021-03-28 NOTE — ED Provider Notes (Addendum)
[facility] EMERGENCY DEPARTMENT Provider Note   CSN: [id number] Arrival date & time: 03/28/21  0458     History Chief Complaint  Patient presents with  . Abdominal Pain    Jorge [name] is a 70 y.o. male.  Patient presents with c/o suprapubic area pain. Pt/family note recurrent symptoms for past couple years. Dull pain in suprapubic area, acute onset, moderate, constant, non radiating. No dysuria. No back/flank pain. No scrotal or testicular pain. No fevers. Is able to void, but small amounts and still feels full, as if has to urinate.  Denies constipation. No abd distension or nausea/vomiting.   The history is provided by the patient and a relative. A language interpreter was used (pt prefers to use family/son as interpreter for him. ).  Abdominal Pain Associated symptoms: no chest pain, no fever, no shortness of breath, no sore throat and no vomiting        Past Medical History:  Diagnosis Date  . Acid reflux   . Arthritis    right leg  . Cataract    bil removed  . Diabetes mellitus without complication ([place])   . Hypertension     Patient Active Problem List   Diagnosis Date Noted  . Abdominal pain, epigastric 05/09/2017  . Lumbosacral spondylosis with radiculopathy 12/25/2016    Past Surgical History:  Procedure Laterality Date  . cataracts Bilateral   . LUMBAR LAMINECTOMY/DECOMPRESSION MICRODISCECTOMY Right 12/25/2016   Procedure: Lumbar three-five Laminectomy, Right Lumbar three-four, Lumbar four-five Diskectomy ;  Surgeon: Kevan Ny Ditty, MD;  Location: [facility];  Service: Neurosurgery;  Laterality: Right;       Family History  Problem Relation Age of Onset  . Colon cancer Neg Hx   . Esophageal cancer Neg Hx   . Pancreatic cancer Neg Hx   . Prostate cancer Neg Hx   . Rectal cancer Neg Hx   . Stomach cancer Neg Hx     Social History   Tobacco Use  . Smoking status: Former Smoker    Packs/day: 0.25    Years: 40.00    Pack  years: 10.00    Quit date: 12/17/2011    Years since quitting: 9.2  . Smokeless tobacco: Never Used  Vaping Use  . Vaping Use: Never used  Substance Use Topics  . Alcohol use: No  . Drug use: No    Home Medications Prior to Admission medications   Medication Sig Start Date End Date Taking? Authorizing Provider  amLODipine (NORVASC) 5 MG tablet Take 5 mg by mouth daily. 07/16/16   [provider]  atorvastatin (LIPITOR) 40 MG tablet Take 40 mg by mouth daily at 6 PM.    [provider]  benzonatate (TESSALON) 100 MG capsule Take 1 capsule (100 mg total) by mouth 3 (three) times daily as needed for cough. 03/30/20   Domenic Moras, PA-C  clarithromycin (BIAXIN) 500 MG tablet Take 1 tablet (500 mg total) by mouth 2 (two) times daily. 03/28/20   Khatri, Hina, PA-C  cyclobenzaprine (FLEXERIL) 10 MG tablet Take 1 tablet (10 mg total) by mouth 2 (two) times daily as needed for muscle spasms. 02/19/18   Domenic Moras, PA-C  dicyclomine (BENTYL) 20 MG tablet Take 20 mg by mouth 4 (four) times daily. 03/25/20   [provider]  esomeprazole (NEXIUM) 40 MG capsule Take 1 capsule (40 mg total) by mouth daily. 04/02/20   Garald Balding, PA-C  famotidine (PEPCID) 20 MG tablet Take 1 tablet (20  mg total) by mouth 2 (two) times daily. 09/05/17   Kirichenko, Tatyana, PA-C  gabapentin (NEURONTIN) 100 MG capsule Take 100 mg by mouth 2 (two) times daily. 08/23/17   [provider]  omeprazole (PRILOSEC) 20 MG capsule Take 1 capsule (20 mg total) by mouth daily. Patient not taking: Reported on 03/28/2020 09/05/17   Jeannett Senior, PA-C  pantoprazole (PROTONIX) 20 MG tablet Take 1 tablet (20 mg total) by mouth daily. 03/28/20   Khatri, Hina, PA-C  polyethylene glycol powder (MIRALAX) powder Take 17 g by mouth daily. 09/25/17   Jola Schmidt, MD  sucralfate (CARAFATE) 1 g tablet Take 1 tablet (1 g total) by mouth 4 (four) times daily. 12/13/19 03/28/20  Bonnita Hollow, MD  traMADol (ULTRAM)  50 MG tablet Take 1 tablet (50 mg total) by mouth 2 (two) times daily as needed for moderate pain or severe pain. 02/19/18   Domenic Moras, PA-C    Allergies    Ivp dye [iodinated diagnostic agents]  Review of Systems   Review of Systems  Constitutional: Negative for fever.  HENT: Negative for sore throat.   Eyes: Negative for redness.  Respiratory: Negative for shortness of breath.   Cardiovascular: Negative for chest pain.  Gastrointestinal: Positive for abdominal pain. Negative for vomiting.  Genitourinary: Negative for flank pain.  Musculoskeletal: Negative for back pain.  Skin: Negative for rash.  Neurological: Negative for headaches.  Hematological: Does not bruise/bleed easily.  Psychiatric/Behavioral: Negative for confusion.    Physical Exam Updated Vital Signs BP (!) 142/87 (BP Location: Right Arm)   Pulse 65   Temp (!) 97.5 F (36.4 C) (Oral)   Resp 16   SpO2 98%   Physical Exam Vitals and nursing note reviewed.  Constitutional:      Appearance: Normal appearance. He is well-developed.  HENT:     Head: Atraumatic.     Nose: Nose normal.     Mouth/Throat:     Mouth: Mucous membranes are moist.  Eyes:     General: No scleral icterus.    Conjunctiva/sclera: Conjunctivae normal.  Neck:     Trachea: No tracheal deviation.  Cardiovascular:     Rate and Rhythm: Normal rate and regular rhythm.     Pulses: Normal pulses.     Heart sounds: Normal heart sounds. No murmur heard. No friction rub. No gallop.   Pulmonary:     Effort: Pulmonary effort is normal. No accessory muscle usage or respiratory distress.     Breath sounds: Normal breath sounds.  Abdominal:     General: Bowel sounds are normal. There is no distension.     Palpations: Abdomen is soft. There is no mass.     Tenderness: There is no abdominal tenderness. There is no guarding or rebound.     Hernia: No hernia is present.  Genitourinary:    Comments: No cva tenderness. Normal external gu exam, no  scrotal or testicular pain, swelling.  Musculoskeletal:        General: No swelling.     Cervical back: Normal range of motion and neck supple.  Skin:    General: Skin is warm and dry.     Findings: No rash.  Neurological:     Mental Status: He is alert.     Comments: Alert, speech clear.   Psychiatric:        Mood and Affect: Mood normal.     ED Results / Procedures / Treatments   Labs (all labs ordered are listed, but  only abnormal results are displayed) Results for orders placed or performed during the hospital encounter of 03/28/21  Lipase, blood  Result Value Ref Range   Lipase 27 11 - 51 U/L  Comprehensive metabolic panel  Result Value Ref Range   Sodium 137 135 - 145 mmol/L   Potassium 3.7 3.5 - 5.1 mmol/L   Chloride 109 98 - 111 mmol/L   CO2 22 22 - 32 mmol/L   Glucose, Bld 105 (H) 70 - 99 mg/dL   BUN 5 (L) 8 - 23 mg/dL   Creatinine, Ser 0.81 0.61 - 1.24 mg/dL   Calcium 9.2 8.9 - 10.3 mg/dL   Total Protein 6.9 6.5 - 8.1 g/dL   Albumin 4.0 3.5 - 5.0 g/dL   AST 23 15 - 41 U/L   ALT 21 0 - 44 U/L   Alkaline Phosphatase 97 38 - 126 U/L   Total Bilirubin 1.4 (H) 0.3 - 1.2 mg/dL   GFR, Estimated >60 >60 mL/min   Anion gap 6 5 - 15  CBC  Result Value Ref Range   WBC 4.6 4.0 - 10.5 K/uL   RBC 4.62 4.22 - 5.81 MIL/uL   Hemoglobin 15.1 13.0 - 17.0 g/dL   HCT 42.1 39.0 - 52.0 %   MCV 91.1 80.0 - 100.0 fL   MCH 32.7 26.0 - 34.0 pg   MCHC 35.9 30.0 - 36.0 g/dL   RDW 13.1 11.5 - 15.5 %   Platelets 191 150 - 400 K/uL   nRBC 0.0 0.0 - 0.2 %  Urinalysis, Routine w reflex microscopic Urine, Clean Catch  Result Value Ref Range   Color, Urine AMBER (A) YELLOW   APPearance CLEAR CLEAR   Specific Gravity, Urine 1.002 (L) 1.005 - 1.030   pH 7.0 5.0 - 8.0   Glucose, UA NEGATIVE NEGATIVE mg/dL   Hgb urine dipstick NEGATIVE NEGATIVE   Bilirubin Urine NEGATIVE NEGATIVE   Ketones, ur NEGATIVE NEGATIVE mg/dL   Protein, ur NEGATIVE NEGATIVE mg/dL   Nitrite POSITIVE (A)  NEGATIVE   Leukocytes,Ua NEGATIVE NEGATIVE   RBC / HPF 0-5 0 - 5 RBC/hpf   WBC, UA 0-5 0 - 5 WBC/hpf   Bacteria, UA NONE SEEN NONE SEEN    EKG None  Radiology No results found.  Procedures Procedures   Medications Ordered in ED Medications  acetaminophen (TYLENOL) tablet 1,000 mg (has no administration in time range)    ED Course  I have reviewed the triage vital signs and the nursing notes.  Pertinent labs & imaging results that were available during my care of the patient were reviewed by me and considered in my medical decision making (see chart for details).    MDM Rules/Calculators/A&P                         Labs sent.   Acetaminophen po.  Reviewed nursing notes and prior charts for additional history. Pt notes to have multiple prior imaging studies for same pain, most all prior imaging studies negative for acute process, including CT in Care Everywhere from ~ 2 weeks ago.   Labs reviewed/interpreted by me - chem normal. Wbc normal. UA w 0 wbc.   Recheck pt comfortable, no distress. Benign abd exam. Given recent imaging, normal vitals, reassuring labs, and benign abd exam, do not feel repeat imaging currently helpful.   PVR bladder scan ~ 419 cc urine. Note made of enlarged prostate on prior imaging. Will place foley/leg bag.  Foley  placed. ~ 600 cc clear yellow urine in bag. Pt feels improved. Leg bag.    Rec pcp/urology f/u.  Return precautions provided.        Final Clinical Impression(s) / ED Diagnoses Final diagnoses:  None    Rx / DC Orders ED Discharge Orders    None        Lajean Saver, MD 03/28/21 [id number]

## 2021-03-28 NOTE — ED Triage Notes (Signed)
Pt BIB by son from home due to abd pain that started yesterday evening. Per son pt has been c/o trouble urinating. Pt is c/o lower abd pain. 8/10 pt speaks [place]. Son at bedside at this time. Pt denies fevers,N&V&D

## 2021-06-20 ENCOUNTER — Other Ambulatory Visit: Payer: Self-pay

## 2021-06-20 ENCOUNTER — Encounter (HOSPITAL_COMMUNITY): Payer: Self-pay | Admitting: Emergency Medicine

## 2021-06-20 ENCOUNTER — Emergency Department (HOSPITAL_COMMUNITY)
Admission: EM | Admit: 2021-06-20 | Discharge: 2021-06-20 | Disposition: A | Discharge status: Home / Self Care | Payer: Medicare Other | Attending: Emergency Medicine | Admitting: Emergency Medicine

## 2021-06-20 ENCOUNTER — Emergency Department (HOSPITAL_COMMUNITY): Payer: Medicare Other

## 2021-06-20 DIAGNOSIS — Z79899 Other long term (current) drug therapy: Secondary | ICD-10-CM | POA: Insufficient documentation

## 2021-06-20 DIAGNOSIS — R339 Retention of urine, unspecified: Secondary | ICD-10-CM | POA: Diagnosis not present

## 2021-06-20 DIAGNOSIS — R300 Dysuria: Secondary | ICD-10-CM | POA: Diagnosis not present

## 2021-06-20 DIAGNOSIS — R1030 Lower abdominal pain, unspecified: Secondary | ICD-10-CM | POA: Insufficient documentation

## 2021-06-20 DIAGNOSIS — E1136 Type 2 diabetes mellitus with diabetic cataract: Secondary | ICD-10-CM | POA: Insufficient documentation

## 2021-06-20 DIAGNOSIS — K5900 Constipation, unspecified: Secondary | ICD-10-CM | POA: Diagnosis not present

## 2021-06-20 DIAGNOSIS — I10 Essential (primary) hypertension: Secondary | ICD-10-CM | POA: Insufficient documentation

## 2021-06-20 DIAGNOSIS — Z87891 Personal history of nicotine dependence: Secondary | ICD-10-CM | POA: Diagnosis not present

## 2021-06-20 DIAGNOSIS — R110 Nausea: Secondary | ICD-10-CM | POA: Diagnosis not present

## 2021-06-20 DIAGNOSIS — R1084 Generalized abdominal pain: Secondary | ICD-10-CM

## 2021-06-20 LAB — LIPASE, BLOOD: Lipase: 24 U/L (ref 11–51)

## 2021-06-20 LAB — COMPREHENSIVE METABOLIC PANEL
ALT: 18 U/L (ref 0–44)
AST: 23 U/L (ref 15–41)
Albumin: 3.9 g/dL (ref 3.5–5.0)
Alkaline Phosphatase: 79 U/L (ref 38–126)
Anion gap: 7 (ref 5–15)
BUN: 5 mg/dL — ABNORMAL LOW (ref 8–23)
CO2: 23 mmol/L (ref 22–32)
Calcium: 8.9 mg/dL (ref 8.9–10.3)
Chloride: 105 mmol/L (ref 98–111)
Creatinine, Ser: 0.77 mg/dL (ref 0.61–1.24)
GFR, Estimated: >60 mL/min (ref >60)
Glucose, Bld: 129 mg/dL — ABNORMAL HIGH (ref 70–99)
Potassium: 3.5 mmol/L (ref 3.5–5.1)
Sodium: 135 mmol/L (ref 135–145)
Total Bilirubin: 0.7 mg/dL (ref 0.3–1.2)
Total Protein: 6.6 g/dL (ref 6.5–8.1)

## 2021-06-20 LAB — URINALYSIS, ROUTINE W REFLEX MICROSCOPIC
Bilirubin Urine: NEGATIVE
Glucose, UA: NEGATIVE mg/dL
Hgb urine dipstick: NEGATIVE
Ketones, ur: NEGATIVE mg/dL
Leukocytes,Ua: NEGATIVE
Nitrite: NEGATIVE
Protein, ur: NEGATIVE mg/dL
Specific Gravity, Urine: 1.005 (ref 1.005–1.030)
pH: 7 (ref 5.0–8.0)

## 2021-06-20 LAB — TROPONIN I (HIGH SENSITIVITY)
Troponin I (High Sensitivity): 6 ng/L (ref <18)
Troponin I (High Sensitivity): 6 ng/L (ref <18)

## 2021-06-20 LAB — CBC WITH DIFFERENTIAL/PLATELET
Abs Immature Granulocytes: 0.01 10*3/uL (ref 0.00–0.07)
Basophils Absolute: 0 10*3/uL (ref 0.0–0.1)
Basophils Relative: 1 %
Eosinophils Absolute: 0.1 10*3/uL (ref 0.0–0.5)
Eosinophils Relative: 2 %
HCT: 39.6 % (ref 39.0–52.0)
Hemoglobin: 13.9 g/dL (ref 13.0–17.0)
Immature Granulocytes: 0 %
Lymphocytes Relative: 30 %
Lymphs Abs: 1.4 10*3/uL (ref 0.7–4.0)
MCH: 32.7 pg (ref 26.0–34.0)
MCHC: 35.1 g/dL (ref 30.0–36.0)
MCV: 93.2 fL (ref 80.0–100.0)
Monocytes Absolute: 0.5 10*3/uL (ref 0.1–1.0)
Monocytes Relative: 10 %
Neutro Abs: 2.7 10*3/uL (ref 1.7–7.7)
Neutrophils Relative %: 57 %
Platelets: 144 10*3/uL — ABNORMAL LOW (ref 150–400)
RBC: 4.25 MIL/uL (ref 4.22–5.81)
RDW: 11.7 % (ref 11.5–15.5)
WBC: 4.7 10*3/uL (ref 4.0–10.5)
nRBC: 0 % (ref 0.0–0.2)

## 2021-06-20 MED ORDER — HYDROCODONE-ACETAMINOPHEN 5-325 MG PO TABS
1.0000 | ORAL_TABLET | Freq: Once | ORAL | Status: AC
  Administered 2021-06-20: 1 via ORAL
  Filled 2021-06-20: qty 1

## 2021-06-20 NOTE — ED Notes (Addendum)
Bladder volume 335ml

## 2021-06-20 NOTE — ED Provider Notes (Signed)
[facility] EMERGENCY DEPARTMENT Provider Note   CSN: [id number] Arrival date & time: 06/20/21  0054     History Chief Complaint  Patient presents with   Abdominal Pain    Jorge [name] is a 70 y.o. male.  Level 5 caveat for language barrier.  Daughter at bedside translates.  Patient with lower abdominal pain and pressure gradual onset since yesterday.  Having pain with urination and small amounts of urine and feel like he cannot empty his bladder.  Similar symptoms in the past which required a Foley catheter.  Denies any fevers, chills, nausea, vomiting.  No change in bowel habits.  No black or bloody stools.  No chest pain or shortness of breath. Has required a Foley catheter in the past.  No previous abdominal surgeries.  The history is provided by the patient.  Abdominal Pain Associated symptoms: constipation and nausea   Associated symptoms: no chest pain, no cough, no diarrhea, no dysuria, no fever, no hematuria, no shortness of breath and no vomiting       Past Medical History:  Diagnosis Date   Acid reflux    Arthritis    right leg   Cataract    bil removed   Diabetes mellitus without complication ([place])    Hypertension     Patient Active Problem List   Diagnosis Date Noted   Abdominal pain, epigastric 05/09/2017   Lumbosacral spondylosis with radiculopathy 12/25/2016    Past Surgical History:  Procedure Laterality Date   cataracts Bilateral    LUMBAR LAMINECTOMY/DECOMPRESSION MICRODISCECTOMY Right 12/25/2016   Procedure: Lumbar three-five Laminectomy, Right Lumbar three-four, Lumbar four-five Diskectomy ;  Surgeon: Kevan Ny Ditty, MD;  Location: [place];  Service: Neurosurgery;  Laterality: Right;       Family History  Problem Relation Age of Onset   Colon cancer Neg Hx    Esophageal cancer Neg Hx    Pancreatic cancer Neg Hx    Prostate cancer Neg Hx    Rectal cancer Neg Hx    Stomach cancer Neg Hx     Social History    Tobacco Use   Smoking status: Former    Packs/day: 0.25    Years: 40.00    Pack years: 10.00    Types: Cigarettes    Quit date: 12/17/2011    Years since quitting: 9.5   Smokeless tobacco: Never  Vaping Use   Vaping Use: Never used  Substance Use Topics   Alcohol use: No   Drug use: No    Home Medications Prior to Admission medications   Medication Sig Start Date End Date Taking? Authorizing Provider  amLODipine (NORVASC) 5 MG tablet Take 5 mg by mouth daily. 07/16/16   [provider]  atorvastatin (LIPITOR) 40 MG tablet Take 40 mg by mouth daily at 6 PM.    [provider]  famotidine (PEPCID) 40 MG tablet Take 40 mg by mouth at bedtime. 03/20/21   [provider]  hyoscyamine (LEVSIN) 0.125 MG tablet Take 0.125 mg by mouth every 4 (four) hours as needed for cramping. 03/10/21   [provider]  KLOR-CON M20 20 MEQ tablet Take 20 mEq by mouth daily. 02/14/21   [provider]  lisinopril (ZESTRIL) 10 MG tablet Take 10 mg by mouth daily. 12/31/20   [provider]  omeprazole (PRILOSEC) 40 MG capsule Take 40 mg by mouth daily. 03/24/21   [provider]  ondansetron (ZOFRAN-ODT) 4 MG disintegrating tablet Take 4 mg by  mouth every 8 (eight) hours as needed for nausea or vomiting. 03/10/21   [provider]  phenazopyridine (PYRIDIUM) 100 MG tablet Take 100-200 mg by mouth every 8 (eight) hours as needed for pain. 03/10/21   [provider]  sucralfate (CARAFATE) 1 GM/10ML suspension Take 1 mL by mouth 4 (four) times daily -  before meals and at bedtime. 03/04/21   [provider]  sulfamethoxazole-trimethoprim (BACTRIM DS) 800-160 MG tablet Take 1 tablet by mouth See admin instructions. Bid x 14 days 03/12/21   [provider]  Vitamin D, Ergocalciferol, (DRISDOL) 1.25 MG (50000 UNIT) CAPS capsule Take 50,000 Units by mouth every Sunday. 10/24/20   [provider]    Allergies    Ivp  dye [iodinated diagnostic agents]  Review of Systems   Review of Systems  Constitutional:  Negative for activity change, appetite change and fever.  HENT:  Negative for congestion and rhinorrhea.   Respiratory:  Negative for cough and shortness of breath.   Cardiovascular:  Negative for chest pain.  Gastrointestinal:  Positive for abdominal pain, constipation and nausea. Negative for diarrhea and vomiting.  Genitourinary:  Negative for dysuria, flank pain and hematuria.  Musculoskeletal:  Negative for arthralgias, back pain and myalgias.  Skin:  Negative for rash.  Neurological:  Negative for dizziness and headaches.   all other systems are negative except as noted in the HPI and PMH.   Physical Exam Updated Vital Signs BP (!) 159/83 (BP Location: Left Arm)   Pulse (!) 57   Temp 97.9 F (36.6 C)   Resp 20   SpO2 98%   Physical Exam Vitals and nursing note reviewed.  Constitutional:      General: He is not in acute distress.    Appearance: He is well-developed.  HENT:     Head: Normocephalic and atraumatic.     Mouth/Throat:     Pharynx: No oropharyngeal exudate.  Eyes:     Conjunctiva/sclera: Conjunctivae normal.     Pupils: Pupils are equal, round, and reactive to light.  Neck:     Comments: No meningismus. Cardiovascular:     Rate and Rhythm: Normal rate and regular rhythm.     Heart sounds: Normal heart sounds. No murmur heard. Pulmonary:     Effort: Pulmonary effort is normal. No respiratory distress.     Breath sounds: Normal breath sounds.  Chest:     Chest wall: No tenderness.  Abdominal:     Palpations: Abdomen is soft.     Tenderness: There is abdominal tenderness. There is no guarding or rebound.     Comments: Suprapubic tenderness, no guarding or rebound, no pain in the Burney's point  Musculoskeletal:        General: No tenderness. Normal range of motion.     Cervical back: Normal range of motion and neck supple.  Skin:    General: Skin is warm.      Capillary Refill: Capillary refill takes less than 2 seconds.  Neurological:     General: No focal deficit present.     Mental Status: He is alert and oriented to person, place, and time. Mental status is at baseline.     Cranial Nerves: No cranial nerve deficit.     Motor: No abnormal muscle tone.     Coordination: Coordination normal.     Comments:  5/5 strength throughout. CN 2-12 intact.Equal grip strength.   Psychiatric:        Behavior: Behavior normal.  ED Results / Procedures / Treatments   Labs (all labs ordered are listed, but only abnormal results are displayed) Labs Reviewed  URINALYSIS, ROUTINE W REFLEX MICROSCOPIC - Abnormal; Notable for the following components:      Result Value   Color, Urine STRAW (*)    All other components within normal limits  CBC WITH DIFFERENTIAL/PLATELET - Abnormal; Notable for the following components:   Platelets 144 (*)    All other components within normal limits  COMPREHENSIVE METABOLIC PANEL - Abnormal; Notable for the following components:   Glucose, Bld 129 (*)    BUN 5 (*)    All other components within normal limits  LIPASE, BLOOD  TROPONIN I (HIGH SENSITIVITY)  TROPONIN I (HIGH SENSITIVITY)    EKG EKG Interpretation  Date/Time:  Friday June 20 2021 05:03:50 EDT Ventricular Rate:  51 PR Interval:  165 QRS Duration: 93 QT Interval:  407 QTC Calculation: 375 R Axis:   42 Text Interpretation: Sinus rhythm Abnormal R-wave progression, early transition Abnormal inferior Q waves No significant change was found Confirmed by Ezequiel Essex [id number]) on 06/20/2021 5:29:40 AM  Radiology CT Renal Stone Study  Result Date: 06/20/2021 CLINICAL DATA:  Lower abdominal pain that started yesterday. Flank pain with kidney stone suspected EXAM: CT ABDOMEN AND PELVIS WITHOUT CONTRAST TECHNIQUE: Multidetector CT imaging of the abdomen and pelvis was performed following the standard protocol without IV contrast. COMPARISON:  03/30/2020  FINDINGS: Lower chest:  No contributory findings. Hepatobiliary: No focal liver abnormality.No evidence of biliary obstruction or stone. Pancreas: Unremarkable. Spleen: Unremarkable. Adrenals/Urinary Tract: Negative adrenals. No hydronephrosis or stone. Unremarkable bladder. Stomach/Bowel:  No obstruction. No appendicitis. Vascular/Lymphatic: No acute vascular abnormality. Scattered atheromatous calcification of the aorta and iliacs. No mass or adenopathy. Reproductive:Mild and symmetric enlargement of the prostate. Other: No ascites or pneumoperitoneum. Musculoskeletal: No acute abnormalities. Lumbar spine degeneration with L4-5 laminectomy. Lower thoracic spondylosis with multi-level bridging. IMPRESSION: No acute finding or explanation for symptoms. Electronically Signed   By: Monte Fantasia M.D.   On: 06/20/2021 06:32    Procedures Procedures   Medications Ordered in ED Medications - No data to display  ED Course  I have reviewed the triage vital signs and the nursing notes.  Pertinent labs & imaging results that were available during my care of the patient were reviewed by me and considered in my medical decision making (see chart for details).    MDM Rules/Calculators/A&P                         Lower abdominal pain with concern for urinary retention.  Bladder scan 385.  Patient able to void to 250 cc on his own  UA negative for infection. Cr at baseline.  EKG sinus rhythm Troponin negative. Low suspicion for ACS.  CT without acute findings.   Repeat bladder scan with over 400 cc. Foley catheter placed with improvement in symptoms.  Troponin negative x2.  Suspect symptoms due to urinary retention. No evidence of UTI.  Followup with PCP and urology. Return precautions discussed.  Final Clinical Impression(s) / ED Diagnoses Final diagnoses:  None    Rx / DC Orders ED Discharge Orders     None        Annaliese Saez, Annie Main, MD 06/20/21 [phone]

## 2021-06-20 NOTE — Discharge Instructions (Signed)
Keep the catheter in place until you follow-up with urology.  Return to the ED with worsening pain, fever, vomiting, not able to urinate or any other concerns.

## 2021-06-20 NOTE — ED Triage Notes (Signed)
Pt c/o lower abdominal pain that started yesterday. Denies nausea/vomiting/diarrhea, endorses pain with urination.

## 2021-06-20 NOTE — ED Notes (Signed)
Patient transported to CT 

## 2021-06-22 ENCOUNTER — Emergency Department (HOSPITAL_COMMUNITY)
Admission: EM | Admit: 2021-06-22 | Discharge: 2021-06-22 | Disposition: A | Discharge status: Home / Self Care | Payer: Medicare Other | Attending: Emergency Medicine | Admitting: Emergency Medicine

## 2021-06-22 ENCOUNTER — Encounter (HOSPITAL_COMMUNITY): Payer: Self-pay | Admitting: Emergency Medicine

## 2021-06-22 DIAGNOSIS — I10 Essential (primary) hypertension: Secondary | ICD-10-CM | POA: Diagnosis not present

## 2021-06-22 DIAGNOSIS — E1136 Type 2 diabetes mellitus with diabetic cataract: Secondary | ICD-10-CM | POA: Insufficient documentation

## 2021-06-22 DIAGNOSIS — Y846 Urinary catheterization as the cause of abnormal reaction of the patient, or of later complication, without mention of misadventure at the time of the procedure: Secondary | ICD-10-CM | POA: Diagnosis not present

## 2021-06-22 DIAGNOSIS — T83098A Other mechanical complication of other indwelling urethral catheter, initial encounter: Secondary | ICD-10-CM | POA: Diagnosis present

## 2021-06-22 DIAGNOSIS — Z79899 Other long term (current) drug therapy: Secondary | ICD-10-CM | POA: Insufficient documentation

## 2021-06-22 DIAGNOSIS — Z87891 Personal history of nicotine dependence: Secondary | ICD-10-CM | POA: Diagnosis not present

## 2021-06-22 DIAGNOSIS — T839XXA Unspecified complication of genitourinary prosthetic device, implant and graft, initial encounter: Secondary | ICD-10-CM

## 2021-06-22 NOTE — Discharge Instructions (Addendum)
Please use the provided follow-up instructions to follow-up with our urology colleagues.  Follow-up with your physician as needed.  Return here for concerning changes in your condition.

## 2021-06-22 NOTE — ED Notes (Signed)
Urine drainage bag changed from standard bag to leg bag by Abby, NT. Statlock placed to secure site.

## 2021-06-22 NOTE — ED Provider Notes (Signed)
[facility] EMERGENCY DEPARTMENT Provider Note   CSN: [id number] Arrival date & time: 06/22/21  1913     History Chief Complaint  Patient presents with   Urine Drain bag leaking    Jorge [name] is a 70 y.o. male.  HPI Patient presents with his son who assists with the history and translation.  Patient speaks Nepali, no [place].  He has a preference for the son and Translation. Patient was seen and evaluated 2 days ago, discharged with a Foley catheter.  He presents today to have evaluation of catheter.  He notes that there is leakage from the bag itself, but otherwise no complaints, no abdominal pain, no fever, no pain, he continues to produce urine into the catheter bag.    Past Medical History:  Diagnosis Date   Acid reflux    Arthritis    right leg   Cataract    bil removed   Diabetes mellitus without complication [facility])    Hypertension     Patient Active Problem List   Diagnosis Date Noted   Abdominal pain, epigastric 05/09/2017   Lumbosacral spondylosis with radiculopathy 12/25/2016    Past Surgical History:  Procedure Laterality Date   cataracts Bilateral    LUMBAR LAMINECTOMY/DECOMPRESSION MICRODISCECTOMY Right 12/25/2016   Procedure: Lumbar three-five Laminectomy, Right Lumbar three-four, Lumbar four-five Diskectomy ;  Surgeon: Kevan Ny Ditty, MD;  Location: [place];  Service: Neurosurgery;  Laterality: Right;       Family History  Problem Relation Age of Onset   Colon cancer Neg Hx    Esophageal cancer Neg Hx    Pancreatic cancer Neg Hx    Prostate cancer Neg Hx    Rectal cancer Neg Hx    Stomach cancer Neg Hx     Social History   Tobacco Use   Smoking status: Former    Packs/day: 0.25    Years: 40.00    Pack years: 10.00    Types: Cigarettes    Quit date: 12/17/2011    Years since quitting: 9.5   Smokeless tobacco: Never  Vaping Use   Vaping Use: Never used  Substance Use Topics   Alcohol use: No   Drug use: No     Home Medications Prior to Admission medications   Medication Sig Start Date End Date Taking? Authorizing Provider  aluminum-magnesium hydroxide-simethicone (MAALOX) 254-270-62 MG/5ML SUSP Take 15 mLs by mouth 3 (three) times daily as needed (indigestion).    [provider]  amLODipine (NORVASC) 5 MG tablet Take 5 mg by mouth daily. Patient not taking: Reported on 06/20/2021 07/16/16   [provider]  atorvastatin (LIPITOR) 40 MG tablet Take 40 mg by mouth daily at 6 PM. Patient not taking: Reported on 06/20/2021    [provider]  famotidine (PEPCID) 40 MG tablet Take 40 mg by mouth at bedtime. 03/20/21   [provider]  hyoscyamine (LEVSIN) 0.125 MG tablet Take 0.125 mg by mouth every 4 (four) hours as needed for cramping. 03/10/21   [provider]  KLOR-CON M20 20 MEQ tablet Take 20 mEq by mouth daily. 02/14/21   [provider]  lisinopril (ZESTRIL) 10 MG tablet Take 10 mg by mouth daily. 12/31/20   [provider]  omeprazole (PRILOSEC) 40 MG capsule Take 40 mg by mouth daily. 03/24/21   [provider]  ondansetron (ZOFRAN-ODT) 4 MG disintegrating tablet Take 4 mg by mouth every 8 (eight) hours as needed for nausea or vomiting. 03/10/21  [provider]  sucralfate (CARAFATE) 1 GM/10ML suspension Take 1 mL by mouth 4 (four) times daily -  before meals and at bedtime. 03/04/21   [provider]  sulfamethoxazole-trimethoprim (BACTRIM DS) 800-160 MG tablet Take 1 tablet by mouth See admin instructions. Bid x 14 days Patient not taking: Reported on 06/20/2021 03/12/21   [provider]  Vitamin D, Ergocalciferol, (DRISDOL) 1.25 MG (50000 UNIT) CAPS capsule Take 50,000 Units by mouth every Sunday. 10/24/20   [provider]    Allergies    Ivp dye [iodinated diagnostic agents]  Review of Systems   Review of Systems  Constitutional:        Per HPI, otherwise negative  HENT:          Per HPI, otherwise negative  Respiratory:         Per HPI, otherwise negative  Cardiovascular:        Per HPI, otherwise negative  Gastrointestinal:  Negative for vomiting.  Endocrine:       Negative aside from HPI  Genitourinary:        Neg aside from HPI   Musculoskeletal:        Per HPI, otherwise negative  Skin: Negative.   Neurological:  Negative for syncope.   Physical Exam Updated Vital Signs BP (!) 154/72   Pulse 64   Temp 98.3 F (36.8 C) (Oral)   Resp 20   SpO2 96%   Physical Exam Vitals and nursing note reviewed.  Constitutional:      General: He is not in acute distress.    Appearance: He is well-developed.  HENT:     Head: Normocephalic and atraumatic.  Eyes:     Conjunctiva/sclera: Conjunctivae normal.  Pulmonary:     Effort: Pulmonary effort is normal. No respiratory distress.     Breath sounds: No stridor.  Abdominal:     General: There is no distension.  Genitourinary:    Comments: Yellow urine collecting in catheter bag. Skin:    General: Skin is warm and dry.  Neurological:     Mental Status: He is alert and oriented to person, place, and time.    ED Results / Procedures / Treatments   Labs (all labs ordered are listed, but only abnormal results are displayed) Labs Reviewed - No data to display  EKG None  Radiology No results found.  Procedures Procedures   Medications Ordered in ED Medications - No data to display  ED Course  I have reviewed the triage vital signs and the nursing notes.  Pertinent labs & imaging results that were available during my care of the patient were reviewed by me and considered in my medical decision making (see chart for details).  On evaluation the patient was sent home with a Foley catheter collection bag to measure volume, typically used at bedside.  Leakage may be secondary to this device rather than a traditional Foley leg bag being provided on discharge. Patient has no complaints, is afebrile, he  and his son deny any concerns, and with continued production of urine, no fever, no alarming findings, patient had change of his catheter collection device, was discharged with appropriate discharge return precautions and follow-up instructions Final Clinical Impression(s) / ED Diagnoses Final diagnoses:  Complication of Foley catheter, initial encounter [facility])    Rx / [place] Orders ED Discharge Orders     None        Carmin Muskrat, MD 06/22/21 2000

## 2021-06-22 NOTE — ED Notes (Signed)
Pt verbalizes understanding of discharge instructions. Opportunity for questions and answers were provided. Pt discharged from the ED.   ?

## 2021-06-22 NOTE — ED Triage Notes (Signed)
Patient requesting urine drainage bag replacement due to leaking port today .

## 2021-07-13 ENCOUNTER — Encounter (HOSPITAL_COMMUNITY): Payer: Self-pay | Admitting: Emergency Medicine

## 2021-07-13 ENCOUNTER — Emergency Department (HOSPITAL_COMMUNITY)
Admission: EM | Admit: 2021-07-13 | Discharge: 2021-07-13 | Disposition: A | Discharge status: Home / Self Care | Payer: Medicare Other | Attending: Emergency Medicine | Admitting: Emergency Medicine

## 2021-07-13 DIAGNOSIS — I10 Essential (primary) hypertension: Secondary | ICD-10-CM | POA: Diagnosis not present

## 2021-07-13 DIAGNOSIS — T839XXA Unspecified complication of genitourinary prosthetic device, implant and graft, initial encounter: Secondary | ICD-10-CM

## 2021-07-13 DIAGNOSIS — Y733 Surgical instruments, materials and gastroenterology and urology devices (including sutures) associated with adverse incidents: Secondary | ICD-10-CM | POA: Diagnosis not present

## 2021-07-13 DIAGNOSIS — Z87891 Personal history of nicotine dependence: Secondary | ICD-10-CM | POA: Insufficient documentation

## 2021-07-13 DIAGNOSIS — E119 Type 2 diabetes mellitus without complications: Secondary | ICD-10-CM | POA: Diagnosis not present

## 2021-07-13 DIAGNOSIS — Z79899 Other long term (current) drug therapy: Secondary | ICD-10-CM | POA: Insufficient documentation

## 2021-07-13 DIAGNOSIS — T83038A Leakage of other indwelling urethral catheter, initial encounter: Secondary | ICD-10-CM | POA: Diagnosis present

## 2021-07-13 NOTE — Discharge Instructions (Addendum)
Follow up with urology as scheduled

## 2021-07-13 NOTE — ED Notes (Signed)
Pt discharged and ambulated out of the ED without difficulty. 

## 2021-07-13 NOTE — ED Triage Notes (Signed)
Pt reports he needs a new urine bag b/c his is leaking.  No other complaints.

## 2021-07-13 NOTE — ED Provider Notes (Signed)
[facility] EMERGENCY DEPARTMENT Provider Note   CSN: [id number] Arrival date & time: 07/13/21  0046     History No chief complaint on file.   Jermey Gevorg Brum is a 70 y.o. male.  Patient is a 70 year old male with indwelling Foley catheter presenting with complaints of leaking from his leg bag.  Patient requesting a new bag as the drain plug appears to be malfunctioning.  Patient denies abdominal pain or decreased urine output.  He has no other complaints.  The history is provided by the patient.      Past Medical History:  Diagnosis Date   Acid reflux    Arthritis    right leg   Cataract    bil removed   Diabetes mellitus without complication [facility])    Hypertension     Patient Active Problem List   Diagnosis Date Noted   Abdominal pain, epigastric 05/09/2017   Lumbosacral spondylosis with radiculopathy 12/25/2016    Past Surgical History:  Procedure Laterality Date   cataracts Bilateral    LUMBAR LAMINECTOMY/DECOMPRESSION MICRODISCECTOMY Right 12/25/2016   Procedure: Lumbar three-five Laminectomy, Right Lumbar three-four, Lumbar four-five Diskectomy ;  Surgeon: Kevan Ny Ditty, MD;  Location: [place];  Service: Neurosurgery;  Laterality: Right;       Family History  Problem Relation Age of Onset   Colon cancer Neg Hx    Esophageal cancer Neg Hx    Pancreatic cancer Neg Hx    Prostate cancer Neg Hx    Rectal cancer Neg Hx    Stomach cancer Neg Hx     Social History   Tobacco Use   Smoking status: Former    Packs/day: 0.25    Years: 40.00    Pack years: 10.00    Types: Cigarettes    Quit date: 12/17/2011    Years since quitting: 9.5   Smokeless tobacco: Never  Vaping Use   Vaping Use: Never used  Substance Use Topics   Alcohol use: No   Drug use: No    Home Medications Prior to Admission medications   Medication Sig Start Date End Date Taking? Authorizing Provider  aluminum-magnesium hydroxide-simethicone (MAALOX)  [phone] MG/5ML SUSP Take 15 mLs by mouth 3 (three) times daily as needed (indigestion).    [provider]  amLODipine (NORVASC) 5 MG tablet Take 5 mg by mouth daily. Patient not taking: Reported on 06/20/2021 07/16/16   [provider]  atorvastatin (LIPITOR) 40 MG tablet Take 40 mg by mouth daily at 6 PM. Patient not taking: Reported on 06/20/2021    [provider]  famotidine (PEPCID) 40 MG tablet Take 40 mg by mouth at bedtime. 03/20/21   [provider]  hyoscyamine (LEVSIN) 0.125 MG tablet Take 0.125 mg by mouth every 4 (four) hours as needed for cramping. 03/10/21   [provider]  KLOR-CON M20 20 MEQ tablet Take 20 mEq by mouth daily. 02/14/21   [provider]  lisinopril (ZESTRIL) 10 MG tablet Take 10 mg by mouth daily. 12/31/20   [provider]  omeprazole (PRILOSEC) 40 MG capsule Take 40 mg by mouth daily. 03/24/21   [provider]  ondansetron (ZOFRAN-ODT) 4 MG disintegrating tablet Take 4 mg by mouth every 8 (eight) hours as needed for nausea or vomiting. 03/10/21   [provider]  sucralfate (CARAFATE) 1 GM/10ML suspension Take 1 mL by mouth 4 (four) times daily -  before meals and at bedtime. 03/04/21   [provider]  sulfamethoxazole-trimethoprim (  BACTRIM DS) 800-160 MG tablet Take 1 tablet by mouth See admin instructions. Bid x 14 days Patient not taking: Reported on 06/20/2021 03/12/21   [provider]  Vitamin D, Ergocalciferol, (DRISDOL) 1.25 MG (50000 UNIT) CAPS capsule Take 50,000 Units by mouth every Sunday. 10/24/20   [provider]    Allergies    Ivp dye [iodinated diagnostic agents]  Review of Systems   Review of Systems  All other systems reviewed and are negative.  Physical Exam Updated Vital Signs BP (!) 150/80 (BP Location: Left Arm)   Pulse 78   Temp 98 F (36.7 C) (Oral)   Resp 18   SpO2 97%   Physical Exam Vitals and nursing note reviewed.   Constitutional:      General: He is not in acute distress.    Appearance: Normal appearance. He is not ill-appearing.  HENT:     Head: Normocephalic and atraumatic.  Pulmonary:     Effort: Pulmonary effort is normal.  Skin:    General: Skin is warm and dry.  Neurological:     Mental Status: He is alert.    ED Results / Procedures / Treatments   Labs (all labs ordered are listed, but only abnormal results are displayed) Labs Reviewed - No data to display  EKG None  Radiology No results found.  Procedures Procedures   Medications Ordered in ED Medications - No data to display  ED Course  I have reviewed the triage vital signs and the nursing notes.  Pertinent labs & imaging results that were available during my care of the patient were reviewed by me and considered in my medical decision making (see chart for details).    MDM Rules/Calculators/A&P  Leg bag to be changed and patient discharged.  Final Clinical Impression(s) / ED Diagnoses Final diagnoses:  None    Rx / DC Orders ED Discharge Orders     None        Veryl Speak, MD 07/13/21 [phone]

## 2021-07-18 ENCOUNTER — Emergency Department (HOSPITAL_BASED_OUTPATIENT_CLINIC_OR_DEPARTMENT_OTHER): Payer: Medicare Other

## 2021-07-18 ENCOUNTER — Other Ambulatory Visit: Payer: Self-pay

## 2021-07-18 ENCOUNTER — Emergency Department (HOSPITAL_BASED_OUTPATIENT_CLINIC_OR_DEPARTMENT_OTHER)
Admission: EM | Admit: 2021-07-18 | Discharge: 2021-07-19 | Disposition: A | Discharge status: Home / Self Care | Payer: Medicare Other | Attending: Emergency Medicine | Admitting: Emergency Medicine

## 2021-07-18 ENCOUNTER — Encounter (HOSPITAL_BASED_OUTPATIENT_CLINIC_OR_DEPARTMENT_OTHER): Payer: Self-pay | Admitting: Emergency Medicine

## 2021-07-18 DIAGNOSIS — N50812 Left testicular pain: Secondary | ICD-10-CM | POA: Insufficient documentation

## 2021-07-18 DIAGNOSIS — I10 Essential (primary) hypertension: Secondary | ICD-10-CM | POA: Insufficient documentation

## 2021-07-18 DIAGNOSIS — Z87891 Personal history of nicotine dependence: Secondary | ICD-10-CM | POA: Diagnosis not present

## 2021-07-18 DIAGNOSIS — N50811 Right testicular pain: Secondary | ICD-10-CM | POA: Insufficient documentation

## 2021-07-18 DIAGNOSIS — E119 Type 2 diabetes mellitus without complications: Secondary | ICD-10-CM | POA: Insufficient documentation

## 2021-07-18 DIAGNOSIS — Z79899 Other long term (current) drug therapy: Secondary | ICD-10-CM | POA: Insufficient documentation

## 2021-07-18 DIAGNOSIS — N50819 Testicular pain, unspecified: Secondary | ICD-10-CM

## 2021-07-18 NOTE — ED Triage Notes (Signed)
  Patient comes in with intermittent testicle pain that started earlier today around 1400.  Patient states that it is sharp and feels the pain in both testicles.  No swelling noted.  Patient has indwelling foley catheter for urinary retention.  Pain 7/10.

## 2021-07-19 DIAGNOSIS — N50811 Right testicular pain: Secondary | ICD-10-CM | POA: Diagnosis not present

## 2021-07-19 MED ORDER — HYDROCODONE-ACETAMINOPHEN 5-325 MG PO TABS: 0.5000 | ORAL_TABLET | ORAL | 0 refills | Status: DC | PRN

## 2021-07-19 MED ORDER — HYDROCODONE-ACETAMINOPHEN 5-325 MG PO TABS
0.5000 | ORAL_TABLET | Freq: Once | ORAL | Status: AC
Start: 2021-07-19 — End: 2021-07-19
  Administered 2021-07-19: 0.5 via ORAL
  Filled 2021-07-19: qty 1

## 2021-07-19 MED ORDER — LEVOFLOXACIN 500 MG PO TABS
500.0000 mg | ORAL_TABLET | Freq: Once | ORAL | Status: AC
  Administered 2021-07-19: 500 mg via ORAL
  Filled 2021-07-19: qty 1

## 2021-07-19 MED ORDER — LEVOFLOXACIN 500 MG PO TABS: ORAL_TABLET | ORAL | 0 refills | Status: DC

## 2021-07-19 NOTE — ED Notes (Signed)
Pt verbalizes understanding of discharge instructions. Opportunity for questioning and answers were provided. Armand removed by staff, pt discharged from ED to home. Educated to pick up Rx.  

## 2021-07-19 NOTE — ED Provider Notes (Signed)
[facility] Provider Note: Georgena Spurling, MD, FACEP  CSN: [id number] MRN: [id number] ARRIVAL: 07/18/21 at 2254 ROOM: [place]  Testicle Pain   HISTORY OF PRESENT ILLNESS  07/19/21 12:52 AM Jorge [name] is a 70 y.o. male with an indwelling catheter for urinary retention.  He has had the catheter for a number of weeks.  He is here with bilateral testicular pain that began about 6 PM yesterday evening.  The pain is sharp and he rates it as a 7.  It is worse with movement or ambulation.  The pain radiates up into his lower abdomen.  He denies injury.  He denies fever or chills.   Past Medical History:  Diagnosis Date   Acid reflux    Arthritis    right leg   Cataract    bil removed   Diabetes mellitus without complication ([place])    Hypertension     Past Surgical History:  Procedure Laterality Date   cataracts Bilateral    LUMBAR LAMINECTOMY/DECOMPRESSION MICRODISCECTOMY Right 12/25/2016   Procedure: Lumbar three-five Laminectomy, Right Lumbar three-four, Lumbar four-five Diskectomy ;  Surgeon: Kevan Ny Ditty, MD;  Location: [facility];  Service: Neurosurgery;  Laterality: Right;    Family History  Problem Relation Age of Onset   Colon cancer Neg Hx    Esophageal cancer Neg Hx    Pancreatic cancer Neg Hx    Prostate cancer Neg Hx    Rectal cancer Neg Hx    Stomach cancer Neg Hx     Social History   Tobacco Use   Smoking status: Former    Packs/day: 0.25    Years: 40.00    Pack years: 10.00    Types: Cigarettes    Quit date: 12/17/2011    Years since quitting: 9.5   Smokeless tobacco: Never  Vaping Use   Vaping Use: Never used  Substance Use Topics   Alcohol use: No   Drug use: No    Prior to Admission medications   Medication Sig Start Date End Date Taking? Authorizing Provider  HYDROcodone-acetaminophen (NORCO) 5-325 MG tablet Take 0.5 tablets by mouth every 4 (four) hours as needed (for pain; may cause constipation).  07/19/21  Yes Aarit Kashuba, MD  levofloxacin (LEVAQUIN) 500 MG tablet Take 1 tablet daily starting 07/20/2021. 07/19/21  Yes Dhyan Noah, MD  aluminum-magnesium hydroxide-simethicone (MAALOX) [id number] MG/5ML SUSP Take 15 mLs by mouth 3 (three) times daily as needed (indigestion).    [provider]  famotidine (PEPCID) 40 MG tablet Take 40 mg by mouth at bedtime. 03/20/21   [provider]  hyoscyamine (LEVSIN) 0.125 MG tablet Take 0.125 mg by mouth every 4 (four) hours as needed for cramping. 03/10/21   [provider]  KLOR-CON M20 20 MEQ tablet Take 20 mEq by mouth daily. 02/14/21   [provider]  lisinopril (ZESTRIL) 10 MG tablet Take 10 mg by mouth daily. 12/31/20   [provider]  omeprazole (PRILOSEC) 40 MG capsule Take 40 mg by mouth daily. 03/24/21   [provider]  ondansetron (ZOFRAN-ODT) 4 MG disintegrating tablet Take 4 mg by mouth every 8 (eight) hours as needed for nausea or vomiting. 03/10/21   [provider]  sucralfate (CARAFATE) 1 GM/10ML suspension Take 1 mL by mouth 4 (four) times daily -  before meals and at bedtime. 03/04/21   [provider]  Vitamin D, Ergocalciferol, (DRISDOL) 1.25 MG (50000 UNIT) CAPS capsule Take 50,000 Units by mouth every  Sunday. 10/24/20   [provider]    Allergies Ivp dye [iodinated diagnostic agents]   REVIEW OF SYSTEMS  Negative except as noted here or in the History of Present Illness.   PHYSICAL EXAMINATION  Initial Vital Signs Blood pressure (!) 151/68, pulse (!) 59, temperature 98.2 F (36.8 C), temperature source Oral, resp. rate 16, height '5\' 5"'$  (1.651 m), weight 70.3 kg, SpO2 93 %.  Examination General: Well-developed, well-nourished male in no acute distress; appearance consistent with age of record HENT: normocephalic; atraumatic Eyes: Normal appearance Neck: supple Heart: regular rate and rhythm Lungs: clear to auscultation bilaterally Abdomen:  soft; nondistended; suprapubic tenderness; bowel sounds present GU: Tanner V male, uncircumcised; Foley catheter in place draining clear yellow urine; testicles tender bilaterally without swelling, mass or hernia palpated; testicles normally situated Extremities: No acute deformity; full range of motion; pulses normal Neurologic: Awake, alert; motor function intact in all extremities and symmetric; no facial droop Skin: Warm and dry Psychiatric: Normal mood and affect   RESULTS  Summary of this visit's results, reviewed and interpreted by myself:   EKG Interpretation  Date/Time:    Ventricular Rate:    PR Interval:    QRS Duration:   QT Interval:    QTC Calculation:   R Axis:     Text Interpretation:         Laboratory Studies: No results found for this or any previous visit (from the past 24 hour(s)). Imaging Studies: US SCROTUM W/DOPPLER  Result Date: 07/18/2021 CLINICAL DATA:  70 year old male with bilateral testicular pain. EXAM: SCROTAL ULTRASOUND DOPPLER ULTRASOUND OF THE TESTICLES TECHNIQUE: Complete ultrasound examination of the testicles, epididymis, and other scrotal structures was performed. Color and spectral Doppler ultrasound were also utilized to evaluate blood flow to the testicles. COMPARISON:  None. FINDINGS: Right testicle Measurements: 4.2 x 2.7 x 3 1 cm. No mass or microlithiasis visualized. Left testicle Measurements: 4.1 x 2 5 x 2.8 cm. No mass or microlithiasis visualized. Right epididymis:  Normal in size and appearance. Left epididymis:  Normal in size and appearance. Hydrocele: Small bilateral hydroceles with low-level echogenic debris. Varicocele:  None visualized. Pulsed Doppler interrogation of both testes demonstrates normal low resistance arterial and venous waveforms bilaterally. IMPRESSION: 1. Unremarkable testicles. 2. Small bilateral hydroceles. Electronically Signed   By: Anner Crete M.D.   On: 07/18/2021 23:47    ED COURSE and MDM  Nursing  notes, initial and subsequent vitals signs, including pulse oximetry, reviewed and interpreted by myself.  Vitals:   07/18/21 2305 07/18/21 2306 07/19/21 0032  BP: 120/82  (!) 151/68  Pulse: 62  (!) 59  Resp: 18  16  Temp: 98.2 F (36.8 C)    TempSrc: Oral    SpO2: 97%  93%  Weight:  70.3 kg   Height:  '5\' 5"'$  (1.651 m)    Medications  levofloxacin (LEVAQUIN) tablet 500 mg (has no administration in time range)  HYDROcodone-acetaminophen (NORCO/VICODIN) 5-325 MG per tablet 0.5 tablet (has no administration in time range)    The cause of the patient's testicle pain is unclear as his ultrasound is unremarkable.  Hydroceles do not usually present with acute pain.  This could represent early bilateral epididymitis.  Epididymitis can sometimes occur in patients with recent genitourinary procedures.  We will start him on levofloxacin and refer to urology.  PROCEDURES  Procedures   ED DIAGNOSES     ICD-10-CM   1. Persistent testicular pain  N50.819 US SCROTUM W/DOPPLER    US  SCROTUM W/DOPPLER         Kaelynne Christley, MD 07/19/21 [phone]

## 2021-08-17 ENCOUNTER — Encounter (HOSPITAL_BASED_OUTPATIENT_CLINIC_OR_DEPARTMENT_OTHER): Payer: Self-pay | Admitting: Obstetrics and Gynecology

## 2021-08-17 ENCOUNTER — Other Ambulatory Visit: Payer: Self-pay

## 2021-08-17 DIAGNOSIS — Z87891 Personal history of nicotine dependence: Secondary | ICD-10-CM | POA: Insufficient documentation

## 2021-08-17 DIAGNOSIS — I10 Essential (primary) hypertension: Secondary | ICD-10-CM | POA: Diagnosis not present

## 2021-08-17 DIAGNOSIS — R1011 Right upper quadrant pain: Secondary | ICD-10-CM | POA: Insufficient documentation

## 2021-08-17 DIAGNOSIS — Z79899 Other long term (current) drug therapy: Secondary | ICD-10-CM | POA: Insufficient documentation

## 2021-08-17 DIAGNOSIS — R1013 Epigastric pain: Secondary | ICD-10-CM | POA: Diagnosis present

## 2021-08-17 DIAGNOSIS — E119 Type 2 diabetes mellitus without complications: Secondary | ICD-10-CM | POA: Diagnosis not present

## 2021-08-17 LAB — CBC
HCT: 41.8 % (ref 39.0–52.0)
Hemoglobin: 14.8 g/dL (ref 13.0–17.0)
MCH: 31 pg (ref 26.0–34.0)
MCHC: 35.4 g/dL (ref 30.0–36.0)
MCV: 87.6 fL (ref 80.0–100.0)
Platelets: 165 10*3/uL (ref 150–400)
RBC: 4.77 MIL/uL (ref 4.22–5.81)
RDW: 11.7 % (ref 11.5–15.5)
WBC: 4.8 10*3/uL (ref 4.0–10.5)
nRBC: 0 % (ref 0.0–0.2)

## 2021-08-17 LAB — COMPREHENSIVE METABOLIC PANEL
ALT: 20 U/L (ref 0–44)
AST: 22 U/L (ref 15–41)
Albumin: 4.4 g/dL (ref 3.5–5.0)
Alkaline Phosphatase: 87 U/L (ref 38–126)
Anion gap: 9 (ref 5–15)
BUN: 6 mg/dL — ABNORMAL LOW (ref 8–23)
CO2: 24 mmol/L (ref 22–32)
Calcium: 9.2 mg/dL (ref 8.9–10.3)
Chloride: 107 mmol/L (ref 98–111)
Creatinine, Ser: 0.77 mg/dL (ref 0.61–1.24)
GFR, Estimated: >60 mL/min (ref >60)
Glucose, Bld: 148 mg/dL — ABNORMAL HIGH (ref 70–99)
Potassium: 3.4 mmol/L — ABNORMAL LOW (ref 3.5–5.1)
Sodium: 140 mmol/L (ref 135–145)
Total Bilirubin: 0.8 mg/dL (ref 0.3–1.2)
Total Protein: 7.5 g/dL (ref 6.5–8.1)

## 2021-08-17 LAB — URINALYSIS, ROUTINE W REFLEX MICROSCOPIC
Bilirubin Urine: NEGATIVE
Glucose, UA: NEGATIVE mg/dL
Hgb urine dipstick: NEGATIVE
Ketones, ur: NEGATIVE mg/dL
Leukocytes,Ua: NEGATIVE
Nitrite: NEGATIVE
Protein, ur: NEGATIVE mg/dL
Specific Gravity, Urine: <1.005 — ABNORMAL LOW (ref 1.005–1.030)
pH: 7 (ref 5.0–8.0)

## 2021-08-17 LAB — LIPASE, BLOOD: Lipase: <10 U/L — ABNORMAL LOW (ref 11–51)

## 2021-08-17 NOTE — ED Triage Notes (Signed)
Patient presents to the ER for RUQ abdominal pain. Patient reports he also has back pain. Abdominal pain started yesterday. No N/v/d.

## 2021-08-18 ENCOUNTER — Emergency Department (HOSPITAL_BASED_OUTPATIENT_CLINIC_OR_DEPARTMENT_OTHER): Payer: Medicare Other

## 2021-08-18 ENCOUNTER — Emergency Department (HOSPITAL_BASED_OUTPATIENT_CLINIC_OR_DEPARTMENT_OTHER)
Admission: EM | Admit: 2021-08-18 | Discharge: 2021-08-18 | Disposition: A | Discharge status: Home / Self Care | Payer: Medicare Other | Attending: Emergency Medicine | Admitting: Emergency Medicine

## 2021-08-18 DIAGNOSIS — R1013 Epigastric pain: Secondary | ICD-10-CM

## 2021-08-18 MED ORDER — BARIUM SULFATE 2.1 % PO SUSP
450.0000 mL | ORAL | Status: AC
  Administered 2021-08-18 (×2): 450 mL via ORAL

## 2021-08-18 MED ORDER — ONDANSETRON HCL 4 MG/2ML IJ SOLN
4.0000 mg | Freq: Once | INTRAMUSCULAR | Status: AC
  Administered 2021-08-18: 4 mg via INTRAVENOUS
  Filled 2021-08-18: qty 2

## 2021-08-18 MED ORDER — MORPHINE SULFATE (PF) 4 MG/ML IV SOLN
4.0000 mg | Freq: Once | INTRAVENOUS | Status: AC
  Administered 2021-08-18: 4 mg via INTRAVENOUS
  Filled 2021-08-18: qty 1

## 2021-08-18 NOTE — Discharge Instructions (Addendum)
You were seen today for abdominal pain.  Make sure to continue your omeprazole and Carafate.  Follow-up with your primary physician.

## 2021-08-18 NOTE — ED Notes (Signed)
Pt given crackers and water at [place]. Pt holding down PO liquid and crackers at this time. ED MD notified.

## 2021-08-18 NOTE — ED Provider Notes (Signed)
[facility] EMERGENCY DEPT Provider Note   CSN: [id number] Arrival date & time: 08/17/21  2052     History Chief Complaint  Patient presents with   Abdominal Pain    Jorge [name] is a 70 y.o. male.  HPI     This is a 70 year old male with history of diabetes and hypertension who presents with abdominal pain.  Patient reports onset of abdominal pain yesterday evening.  It is worse after eating.  Has epigastric right upper quadrant.  He describes it as burning and radiates into the chest.  Denies shortness of breath or cough.  No fevers.  Reports nausea without vomiting.  No changes levels.  No known sick contacts or COVID exposures.  He has taken Carafate at home with some improvement.  Past Medical History:  Diagnosis Date   Acid reflux    Arthritis    right leg   Cataract    bil removed   Diabetes mellitus without complication [facility])    Hypertension     Patient Active Problem List   Diagnosis Date Noted   Abdominal pain, epigastric 05/09/2017   Lumbosacral spondylosis with radiculopathy 12/25/2016    Past Surgical History:  Procedure Laterality Date   cataracts Bilateral    LUMBAR LAMINECTOMY/DECOMPRESSION MICRODISCECTOMY Right 12/25/2016   Procedure: Lumbar three-five Laminectomy, Right Lumbar three-four, Lumbar four-five Diskectomy ;  Surgeon: Kevan Ny Ditty, MD;  Location: [facility];  Service: Neurosurgery;  Laterality: Right;       Family History  Problem Relation Age of Onset   Colon cancer Neg Hx    Esophageal cancer Neg Hx    Pancreatic cancer Neg Hx    Prostate cancer Neg Hx    Rectal cancer Neg Hx    Stomach cancer Neg Hx     Social History   Tobacco Use   Smoking status: Former    Packs/day: 0.25    Years: 40.00    Pack years: 10.00    Types: Cigarettes    Quit date: 12/17/2011    Years since quitting: 9.6   Smokeless tobacco: Never  Vaping Use   Vaping Use: Never used  Substance Use Topics   Alcohol use: No   Drug  use: No    Home Medications Prior to Admission medications   Medication Sig Start Date End Date Taking? Authorizing Provider  aluminum-magnesium hydroxide-simethicone (MAALOX) [id number] MG/5ML SUSP Take 15 mLs by mouth 3 (three) times daily as needed (indigestion).    [provider]  famotidine (PEPCID) 40 MG tablet Take 40 mg by mouth at bedtime. 03/20/21   [provider]  HYDROcodone-acetaminophen (NORCO) 5-325 MG tablet Take 0.5 tablets by mouth every 4 (four) hours as needed (for pain; may cause constipation). 07/19/21   Molpus, John, MD  hyoscyamine (LEVSIN) 0.125 MG tablet Take 0.125 mg by mouth every 4 (four) hours as needed for cramping. 03/10/21   [provider]  KLOR-CON M20 20 MEQ tablet Take 20 mEq by mouth daily. 02/14/21   [provider]  levofloxacin (LEVAQUIN) 500 MG tablet Take 1 tablet daily starting 07/20/2021. 07/19/21   Molpus, Jenny Reichmann, MD  lisinopril (ZESTRIL) 10 MG tablet Take 10 mg by mouth daily. 12/31/20   [provider]  omeprazole (PRILOSEC) 40 MG capsule Take 40 mg by mouth daily. 03/24/21   [provider]  ondansetron (ZOFRAN-ODT) 4 MG disintegrating tablet Take 4 mg by mouth every 8 (eight) hours as needed for nausea or vomiting. 03/10/21   [provider]  sucralfate (CARAFATE) 1 GM/10ML suspension Take 1 mL by mouth 4 (four) times daily -  before meals and at bedtime. 03/04/21   [provider]  Vitamin D, Ergocalciferol, (DRISDOL) 1.25 MG (50000 UNIT) CAPS capsule Take 50,000 Units by mouth every Sunday. 10/24/20   [provider]    Allergies    Ivp dye [iodinated diagnostic agents]  Review of Systems   Review of Systems  Constitutional:  Negative for fever.  Respiratory:  Negative for shortness of breath.   Cardiovascular:  Negative for chest pain.  Gastrointestinal:  Positive for abdominal pain. Negative for constipation, diarrhea, nausea and vomiting.  Genitourinary:  Negative  for dysuria.  All other systems reviewed and are negative.  Physical Exam Updated Vital Signs BP (!) 148/80   Pulse (!) 58   Temp 98.1 F (36.7 C)   Resp 16   Ht 1.651 m ('5\' 5"'$ )   Wt 71 kg   SpO2 96%   BMI 26.05 kg/m   Physical Exam Vitals and nursing note reviewed.  Constitutional:      Appearance: He is well-developed. He is not ill-appearing.  HENT:     Head: Normocephalic and atraumatic.     Mouth/Throat:     Mouth: Mucous membranes are moist.  Eyes:     Pupils: Pupils are equal, round, and reactive to light.  Cardiovascular:     Rate and Rhythm: Normal rate and regular rhythm.     Heart sounds: Normal heart sounds. No murmur heard. Pulmonary:     Effort: Pulmonary effort is normal. No respiratory distress.     Breath sounds: Normal breath sounds. No wheezing.  Abdominal:     General: Bowel sounds are normal.     Palpations: Abdomen is soft.     Tenderness: There is abdominal tenderness in the right upper quadrant and epigastric area. There is no guarding or rebound.  Musculoskeletal:     Cervical back: Neck supple.  Lymphadenopathy:     Cervical: No cervical adenopathy.  Skin:    General: Skin is warm and dry.  Neurological:     Mental Status: He is alert and oriented to person, place, and time.  Psychiatric:        Mood and Affect: Mood normal.    ED Results / Procedures / Treatments   Labs (all labs ordered are listed, but only abnormal results are displayed) Labs Reviewed  LIPASE, BLOOD - Abnormal; Notable for the following components:      Result Value   Lipase <10 (*)    All other components within normal limits  COMPREHENSIVE METABOLIC PANEL - Abnormal; Notable for the following components:   Potassium 3.4 (*)    Glucose, Bld 148 (*)    BUN 6 (*)    All other components within normal limits  URINALYSIS, ROUTINE W REFLEX MICROSCOPIC - Abnormal; Notable for the following components:   Color, Urine COLORLESS (*)    Specific Gravity, Urine <1.005  (*)    All other components within normal limits  CBC    EKG None  Radiology CT ABDOMEN PELVIS WO CONTRAST  Result Date: 08/18/2021 CLINICAL DATA:  Epigastric abdominal pain, right upper quadrant abdominal pain EXAM: CT ABDOMEN AND PELVIS WITHOUT CONTRAST TECHNIQUE: Multidetector CT imaging of the abdomen and pelvis was performed following the standard protocol without IV contrast. COMPARISON:  06/20/2021 FINDINGS: Lower chest: The visualized lung bases are clear. The visualized heart and pericardium are unremarkable. Hepatobiliary: Tiny cyst within the left hepatic lobe. Liver  otherwise unremarkable. Gallbladder unremarkable. No intra or extrahepatic biliary ductal dilation. Pancreas: Unremarkable Spleen: Unremarkable Adrenals/Urinary Tract: Adrenal glands are unremarkable. Kidneys are normal, without renal calculi, focal lesion, or hydronephrosis. Bladder is unremarkable. Stomach/Bowel: Stomach is within normal limits. Appendix appears normal. No evidence of bowel wall thickening, distention, or inflammatory changes. No free intraperitoneal gas or fluid. Vascular/Lymphatic: Mild aortoiliac atherosclerotic calcification. No aortic aneurysm. No pathologic adenopathy within the abdomen and pelvis. Reproductive: Mild prostatic enlargement. Other: No abdominal wall hernia.  Rectum unremarkable. Musculoskeletal: No acute bone abnormality. Bilateral laminectomy and resection of the spinous process of L4 has been performed. No lytic or blastic bone lesion. IMPRESSION: No acute intra-abdominal pathology identified. No definite radiographic explanation for the patient's reported symptoms. Aortic Atherosclerosis (ICD10-I70.0). Electronically Signed   By: Fidela Salisbury M.D.   On: 08/18/2021 03:27    Procedures Procedures   Medications Ordered in ED Medications  morphine 4 MG/ML injection 4 mg (4 mg Intravenous Given 08/18/21 0136)  ondansetron (ZOFRAN) injection 4 mg (4 mg Intravenous Given 08/18/21 0136)   Barium Sulfate 2.1 % SUSP 450 mL (450 mLs Oral Given 08/18/21 [id number])    ED Course  I have reviewed the triage vital signs and the nursing notes.  Pertinent labs & imaging results that were available during my care of the patient were reviewed by me and considered in my medical decision making (see chart for details).    MDM Rules/Calculators/A&P                          Patient presents with upper abdominal epigastric pain.  He is overall nontoxic and vital signs are reassuring.  Minimal epigastric and right upper quadrant tenderness palpation.  No signs of peritonitis.  History of acid reflux.  He has had some improvement with Carafate.  Could be gastritis/GERD, pancreatitis, cholecystitis.  Labs obtained.  Patient given pain and nausea medication.  Labs including LFTs and lipase are reassuring arguing against gallbladder or pancreatitis.  CT scan obtained.  CT scan does not show any obvious intra-abdominal pathology.  Patient largely improved and can tolerate fluids.  Highly suspect ongoing gastritis/reflux.  Recommend continuing Carafate and omeprazole as previously prescribed.  After history, exam, and medical workup I feel the patient has been appropriately medically screened and is safe for discharge home. Pertinent diagnoses were discussed with the patient. Patient was given return precautions.  Final Clinical Impression(s) / ED Diagnoses Final diagnoses:  Epigastric pain    Rx / DC Orders ED Discharge Orders     None        Jesseka Drinkard, Barbette Hair, MD 08/18/21 [phone]

## 2022-01-18 ENCOUNTER — Encounter (HOSPITAL_BASED_OUTPATIENT_CLINIC_OR_DEPARTMENT_OTHER): Payer: Self-pay | Admitting: Emergency Medicine

## 2022-01-18 ENCOUNTER — Emergency Department (HOSPITAL_BASED_OUTPATIENT_CLINIC_OR_DEPARTMENT_OTHER)
Admission: EM | Admit: 2022-01-18 | Discharge: 2022-01-18 | Disposition: A | Discharge status: Home / Self Care | Payer: Medicare Other | Attending: Emergency Medicine | Admitting: Emergency Medicine

## 2022-01-18 ENCOUNTER — Emergency Department (HOSPITAL_BASED_OUTPATIENT_CLINIC_OR_DEPARTMENT_OTHER): Payer: Medicare Other

## 2022-01-18 ENCOUNTER — Other Ambulatory Visit: Payer: Self-pay

## 2022-01-18 DIAGNOSIS — R0602 Shortness of breath: Secondary | ICD-10-CM | POA: Insufficient documentation

## 2022-01-18 DIAGNOSIS — R1031 Right lower quadrant pain: Secondary | ICD-10-CM | POA: Insufficient documentation

## 2022-01-18 DIAGNOSIS — Z20822 Contact with and (suspected) exposure to covid-19: Secondary | ICD-10-CM | POA: Insufficient documentation

## 2022-01-18 DIAGNOSIS — N5082 Scrotal pain: Secondary | ICD-10-CM | POA: Diagnosis not present

## 2022-01-18 DIAGNOSIS — R079 Chest pain, unspecified: Secondary | ICD-10-CM

## 2022-01-18 DIAGNOSIS — I10 Essential (primary) hypertension: Secondary | ICD-10-CM | POA: Diagnosis not present

## 2022-01-18 DIAGNOSIS — K219 Gastro-esophageal reflux disease without esophagitis: Secondary | ICD-10-CM | POA: Diagnosis not present

## 2022-01-18 DIAGNOSIS — I2699 Other pulmonary embolism without acute cor pulmonale: Secondary | ICD-10-CM | POA: Diagnosis not present

## 2022-01-18 DIAGNOSIS — Z79899 Other long term (current) drug therapy: Secondary | ICD-10-CM | POA: Insufficient documentation

## 2022-01-18 DIAGNOSIS — N50819 Testicular pain, unspecified: Secondary | ICD-10-CM

## 2022-01-18 LAB — BRAIN NATRIURETIC PEPTIDE: B Natriuretic Peptide: 4.6 pg/mL (ref 0.0–100.0)

## 2022-01-18 LAB — CBC WITH DIFFERENTIAL/PLATELET
Abs Immature Granulocytes: 0.01 10*3/uL (ref 0.00–0.07)
Basophils Absolute: 0 10*3/uL (ref 0.0–0.1)
Basophils Relative: 1 %
Eosinophils Absolute: 0.1 10*3/uL (ref 0.0–0.5)
Eosinophils Relative: 3 %
HCT: 42.6 % (ref 39.0–52.0)
Hemoglobin: 14.9 g/dL (ref 13.0–17.0)
Immature Granulocytes: 0 %
Lymphocytes Relative: 51 %
Lymphs Abs: 2.2 10*3/uL (ref 0.7–4.0)
MCH: 32.5 pg (ref 26.0–34.0)
MCHC: 35 g/dL (ref 30.0–36.0)
MCV: 92.8 fL (ref 80.0–100.0)
Monocytes Absolute: 0.4 10*3/uL (ref 0.1–1.0)
Monocytes Relative: 9 %
Neutro Abs: 1.6 10*3/uL — ABNORMAL LOW (ref 1.7–7.7)
Neutrophils Relative %: 36 %
Platelets: 75 10*3/uL — ABNORMAL LOW (ref 150–400)
RBC: 4.59 MIL/uL (ref 4.22–5.81)
RDW: 12.8 % (ref 11.5–15.5)
Smear Review: DECREASED
WBC: 4.4 10*3/uL (ref 4.0–10.5)
nRBC: 0 % (ref 0.0–0.2)

## 2022-01-18 LAB — RESP PANEL BY RT-PCR (FLU A&B, COVID) ARPGX2
Influenza A by PCR: NEGATIVE
Influenza B by PCR: NEGATIVE
SARS Coronavirus 2 by RT PCR: NEGATIVE

## 2022-01-18 LAB — LIPASE, BLOOD: Lipase: <10 U/L — ABNORMAL LOW (ref 11–51)

## 2022-01-18 LAB — URINALYSIS, ROUTINE W REFLEX MICROSCOPIC
Bilirubin Urine: NEGATIVE
Glucose, UA: NEGATIVE mg/dL
Hgb urine dipstick: NEGATIVE
Ketones, ur: NEGATIVE mg/dL
Leukocytes,Ua: NEGATIVE
Nitrite: NEGATIVE
Protein, ur: NEGATIVE mg/dL
Specific Gravity, Urine: 1.007 (ref 1.005–1.030)
pH: 6.5 (ref 5.0–8.0)

## 2022-01-18 LAB — COMPREHENSIVE METABOLIC PANEL
ALT: 24 U/L (ref 0–44)
AST: 34 U/L (ref 15–41)
Albumin: 4.4 g/dL (ref 3.5–5.0)
Alkaline Phosphatase: 64 U/L (ref 38–126)
Anion gap: 10 (ref 5–15)
BUN: 9 mg/dL (ref 8–23)
CO2: 22 mmol/L (ref 22–32)
Calcium: 9.3 mg/dL (ref 8.9–10.3)
Chloride: 104 mmol/L (ref 98–111)
Creatinine, Ser: 0.87 mg/dL (ref 0.61–1.24)
GFR, Estimated: >60 mL/min (ref >60)
Glucose, Bld: 102 mg/dL — ABNORMAL HIGH (ref 70–99)
Potassium: 4.5 mmol/L (ref 3.5–5.1)
Sodium: 136 mmol/L (ref 135–145)
Total Bilirubin: 1.7 mg/dL — ABNORMAL HIGH (ref 0.3–1.2)
Total Protein: 7.3 g/dL (ref 6.5–8.1)

## 2022-01-18 LAB — TROPONIN I (HIGH SENSITIVITY)
Troponin I (High Sensitivity): 5 ng/L (ref <18)
Troponin I (High Sensitivity): 6 ng/L (ref <18)

## 2022-01-18 LAB — D-DIMER, QUANTITATIVE: D-Dimer, Quant: 1 ug/mL-FEU — ABNORMAL HIGH (ref 0.00–0.50)

## 2022-01-18 MED ORDER — ONDANSETRON HCL 4 MG/2ML IJ SOLN
4.0000 mg | Freq: Once | INTRAMUSCULAR | Status: AC
  Administered 2022-01-18: 4 mg via INTRAVENOUS
  Filled 2022-01-18: qty 2

## 2022-01-18 MED ORDER — LIDOCAINE VISCOUS HCL 2 % MT SOLN
15.0000 mL | Freq: Once | OROMUCOSAL | Status: AC
Start: 2022-01-18 — End: 2022-01-18
  Administered 2022-01-18: 15 mL via ORAL
  Filled 2022-01-18: qty 15

## 2022-01-18 MED ORDER — ALUM & MAG HYDROXIDE-SIMETH 200-200-20 MG/5ML PO SUSP
30.0000 mL | Freq: Once | ORAL | Status: AC
Start: 2022-01-18 — End: 2022-01-18
  Administered 2022-01-18: 30 mL via ORAL
  Filled 2022-01-18: qty 30

## 2022-01-18 MED ORDER — FAMOTIDINE 40 MG PO TABS: 40.0000 mg | ORAL_TABLET | Freq: Every day | ORAL | 0 refills | Status: DC

## 2022-01-18 MED ORDER — PANTOPRAZOLE SODIUM 40 MG PO TBEC: 40.0000 mg | DELAYED_RELEASE_TABLET | Freq: Every day | ORAL | 0 refills | Status: DC

## 2022-01-18 MED ORDER — IOHEXOL 350 MG/ML SOLN
100.0000 mL | Freq: Once | INTRAVENOUS | Status: AC | PRN
  Administered 2022-01-18: 55 mL via INTRAVENOUS

## 2022-01-18 NOTE — ED Triage Notes (Signed)
Pt presents with chest pain since Saturday, having right lower abd pain and headache as well. Pt also states he has been short of breath.

## 2022-01-18 NOTE — ED Notes (Signed)
Patient transported to CT 

## 2022-01-18 NOTE — Discharge Instructions (Addendum)
You were evaluated in the Emergency Department and after careful evaluation, we did not find any emergent condition requiring admission or further testing in the hospital.  Your exam/testing today was overall reassuring.  Your EKG was unremarkable, your chest x-ray and CT scans did not reveal any acute abnormalities.  Your ultrasound of your scrotum was also reassuring.  Your laboratory work-up to include negative cardiac enzymes was reassuring.  I am not sure of the etiology of your scrotal pain.  There was no evidence of hernia on testicular exam.  Your CT scan was normal.  Your symptoms of chest discomfort are most consistent with reflux.  Recommend you continue taking your Pepcid tablet.  I have added a new prescription for Protonix which should take a couple of weeks to take effect.  Recommend you follow-up with your PCP for recheck.  Please return to the Emergency Department if you experience any worsening of your condition.  Thank you for allowing [place] to be a part of your care.

## 2022-01-18 NOTE — ED Provider Notes (Signed)
[facility] DEPT Provider Note   CSN: [id number] Arrival date & time: 01/18/22  0813     History  Chief Complaint  Patient presents with   Chest Pain    Jorge [name] is a 71 y.o. male.   Chest Pain  71 year old male with a history of hypertension and GERD presenting to the emergency department with intermittent chest pain that has been bothering him for the past year.  The patient states that he gets symptoms of reflux at night that worsen when he wakes up in the morning and persist throughout the morning.  Usually they resolve by mid afternoon.  He states that his chest pain symptoms are nonexertional.  Symptoms are described as a burning sensation.  Additionally, the patient states that he has had bilateral testicular pain with no dysuria.  He complains of these symptoms that have been intermittent for the past year but have worsened over the past few days.  Denies any lower extremity swelling.  Additionally, he occasionally gets short of breath and has a cough.  He denies any fevers or chills.  He does have a history of evaluation for bladder mass via cystoscopy which revealed no evidence of cancer via urology notes from 2022 in [place].  He currently denies any dysuria or difficulty voiding.  Home Medications Prior to Admission medications   Medication Sig Start Date End Date Taking? Authorizing Provider  pantoprazole (PROTONIX) 40 MG tablet Take 1 tablet (40 mg total) by mouth daily. 01/18/22 02/17/22 Yes Regan Lemming, MD  aluminum-magnesium hydroxide-simethicone (MAALOX) 200-200-20 MG/5ML SUSP Take 15 mLs by mouth 3 (three) times daily as needed (indigestion).    [provider]  famotidine (PEPCID) 40 MG tablet Take 1 tablet (40 mg total) by mouth at bedtime. 01/18/22 02/17/22  Regan Lemming, MD  HYDROcodone-acetaminophen (NORCO) 5-325 MG tablet Take 0.5 tablets by mouth every 4 (four) hours as needed (for pain; may cause constipation). 07/19/21    Molpus, John, MD  hyoscyamine (LEVSIN) 0.125 MG tablet Take 0.125 mg by mouth every 4 (four) hours as needed for cramping. 03/10/21   [provider]  KLOR-CON M20 20 MEQ tablet Take 20 mEq by mouth daily. 02/14/21   [provider]  levofloxacin (LEVAQUIN) 500 MG tablet Take 1 tablet daily starting 07/20/2021. 07/19/21   Molpus, Jenny Reichmann, MD  lisinopril (ZESTRIL) 10 MG tablet Take 10 mg by mouth daily. 12/31/20   [provider]  omeprazole (PRILOSEC) 40 MG capsule Take 40 mg by mouth daily. 03/24/21   [provider]  ondansetron (ZOFRAN-ODT) 4 MG disintegrating tablet Take 4 mg by mouth every 8 (eight) hours as needed for nausea or vomiting. 03/10/21   [provider]  sucralfate (CARAFATE) 1 GM/10ML suspension Take 1 mL by mouth 4 (four) times daily -  before meals and at bedtime. 03/04/21   [provider]  Vitamin D, Ergocalciferol, (DRISDOL) 1.25 MG (50000 UNIT) CAPS capsule Take 50,000 Units by mouth every Sunday. 10/24/20   [provider]      Allergies    Ivp dye [iodinated contrast media]    Review of Systems   Review of Systems  Cardiovascular:  Positive for chest pain.  Genitourinary:  Positive for testicular pain.  All other systems reviewed and are negative.  Physical Exam Updated Vital Signs BP 122/82 (BP Location: Left Arm)    Pulse 60    Temp 97.7 F (36.5 C) (Oral)    Resp 20    SpO2 97%  Physical Exam Vitals and nursing note reviewed. Exam conducted with a chaperone present.  Constitutional:      General: He is not in acute distress.    Appearance: He is well-developed.  HENT:     Head: Normocephalic and atraumatic.  Eyes:     Conjunctiva/sclera: Conjunctivae normal.  Cardiovascular:     Rate and Rhythm: Normal rate and regular rhythm.     Heart sounds: No murmur heard. Pulmonary:     Effort: Pulmonary effort is normal. No respiratory distress.     Breath sounds: Normal breath sounds.  Abdominal:      Palpations: Abdomen is soft.     Tenderness: There is abdominal tenderness in the right lower quadrant, suprapubic area and left lower quadrant.  Genitourinary:    Testes:        Right: Tenderness present. Mass or swelling not present.        Left: Tenderness present. Mass or swelling not present.  Musculoskeletal:        General: No swelling.     Cervical back: Neck supple.     Right lower leg: No edema.     Left lower leg: No edema.  Skin:    General: Skin is warm and dry.     Capillary Refill: Capillary refill takes less than 2 seconds.  Neurological:     Mental Status: He is alert.  Psychiatric:        Mood and Affect: Mood normal.    ED Results / Procedures / Treatments   Labs (all labs ordered are listed, but only abnormal results are displayed) Labs Reviewed  COMPREHENSIVE METABOLIC PANEL - Abnormal; Notable for the following components:      Result Value   Glucose, Bld 102 (*)    Total Bilirubin 1.7 (*)    All other components within normal limits  CBC WITH DIFFERENTIAL/PLATELET - Abnormal; Notable for the following components:   Platelets 75 (*)    Neutro Abs 1.6 (*)    All other components within normal limits  D-DIMER, QUANTITATIVE - Abnormal; Notable for the following components:   D-Dimer, Quant 1.00 (*)    All other components within normal limits  URINALYSIS, ROUTINE W REFLEX MICROSCOPIC - Abnormal; Notable for the following components:   Color, Urine COLORLESS (*)    All other components within normal limits  LIPASE, BLOOD - Abnormal; Notable for the following components:   Lipase <10 (*)    All other components within normal limits  RESP PANEL BY RT-PCR (FLU A&B, COVID) [id number]  BRAIN NATRIURETIC PEPTIDE  TROPONIN I (HIGH SENSITIVITY)  TROPONIN I (HIGH SENSITIVITY)    EKG EKG Interpretation  Date/Time:  Sunday January 18 2022 08:22:53 EST Ventricular Rate:  58 PR Interval:  186 QRS Duration: 92 QT Interval:  413 QTC Calculation: 406 R  Axis:   58 Text Interpretation: Sinus bradycardia No significant change since last tracing Confirmed by Regan Lemming ([id number]) on 01/18/2022 8:24:41 AM  Radiology CT ABDOMEN PELVIS WO CONTRAST  Result Date: 01/18/2022 CLINICAL DATA:  Abdominal pain EXAM: CT ABDOMEN AND PELVIS WITHOUT CONTRAST TECHNIQUE: Multidetector CT imaging of the abdomen and pelvis was performed following the standard protocol without IV contrast. RADIATION DOSE REDUCTION: This exam was performed according to the departmental dose-optimization program which includes automated exposure control, adjustment of the mA and/or kV according to patient size and/or use of iterative reconstruction technique. COMPARISON:  CT abdomen and pelvis 08/18/2021 FINDINGS: Lower chest: No acute abnormality. Hepatobiliary: Liver is normal in  size and contour. Stable subcentimeter hypodense cyst in the left hepatic lobe. Gallbladder appears normal. No biliary ductal dilatation identified. Pancreas: Unremarkable. No pancreatic ductal dilatation or surrounding inflammatory changes. Spleen: Normal in size without focal abnormality. Adrenals/Urinary Tract: Adrenal glands are unremarkable. Kidneys are normal, without renal calculi, focal lesion, or hydronephrosis. Bladder is unremarkable. Stomach/Bowel: Stomach is within normal limits. Appendix appears normal. No evidence of bowel wall thickening, distention, or inflammatory changes. Vascular/Lymphatic: Aortic atherosclerosis. No enlarged abdominal or pelvic lymph nodes. Reproductive: Prostate gland is enlarged. Other: No abdominal wall hernia or abnormality. No abdominopelvic ascites. Musculoskeletal: Degenerative changes in the lumbar spine. 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 No suspicious bony lesions identified. IMPRESSION: 1. No acute process identified in the abdomen or pelvis. 2. Prostatomegaly. Electronically Signed   By: Ofilia Neas M.D.   On: 01/18/2022 09:36   CT Angio Chest PE W and/or Wo  Contrast  Result Date: 01/18/2022 CLINICAL DATA:  Evaluate for pulmonary embolism. EXAM: CT ANGIOGRAPHY CHEST WITH CONTRAST TECHNIQUE: Multidetector CT imaging of the chest was performed using the standard protocol during bolus administration of intravenous contrast. Multiplanar CT image reconstructions and MIPs were obtained to evaluate the vascular anatomy. RADIATION DOSE REDUCTION: This exam was performed according to the departmental dose-optimization program which includes automated exposure control, adjustment of the mA and/or kV according to patient size and/or use of iterative reconstruction technique. CONTRAST:  28mL OMNIPAQUE IOHEXOL 350 MG/ML SOLN COMPARISON:  None FINDINGS: Cardiovascular: Satisfactory opacification of the pulmonary arteries to the segmental level. No evidence of pulmonary embolism. Mild aortic atherosclerosis. Coronary artery calcifications noted. Normal heart size. No pericardial effusion. Mediastinum/Nodes: No enlarged mediastinal, hilar, or axillary lymph nodes. Thyroid gland, trachea, and esophagus demonstrate no significant findings. Lungs/Pleura: No pleural effusion, pneumothorax, airspace consolidation, or atelectasis. No pulmonary edema. No suspicious lung nodules. Upper Abdomen: No acute abnormality within the imaged portions of the upper abdomen. Low-density structure in left lobe of liver is too small to characterize measuring 6 mm. Musculoskeletal: No acute abnormality. Review of the MIP images confirms the above findings. IMPRESSION: 1. No evidence for acute pulmonary embolism. 2. Coronary artery calcifications. 3. Aortic Atherosclerosis (ICD10-I70.0). Electronically Signed   By: Kerby Moors M.D.   On: 01/18/2022 11:06   DG Chest Port 1 View  Result Date: 01/18/2022 CLINICAL DATA:  Shortness of breath EXAM: PORTABLE CHEST 1 VIEW COMPARISON:  Chest x-ray 03/28/2020 FINDINGS: Heart size and mediastinal contours are within normal limits. No suspicious pulmonary  opacities identified. No pleural effusion or pneumothorax visualized. No acute osseous abnormality appreciated. IMPRESSION: No acute intrathoracic process identified. Electronically Signed   By: Ofilia Neas M.D.   On: 01/18/2022 09:27   US SCROTUM W/DOPPLER  Result Date: 01/18/2022 CLINICAL DATA:  71 year old male with bilateral testicular pain for 2 days. EXAM: SCROTAL ULTRASOUND DOPPLER ULTRASOUND OF THE TESTICLES TECHNIQUE: Complete ultrasound examination of the testicles, epididymis, and other scrotal structures was performed. Color and spectral Doppler ultrasound were also utilized to evaluate blood flow to the testicles. COMPARISON:  07/18/2021 ultrasound FINDINGS: Right testicle Measurements: 3.9 x 2.1 x 3.3 cm. No mass or microlithiasis visualized. Left testicle Measurements: 4.3 x 2.5 x 2.9 cm. No mass or microlithiasis visualized. Right epididymis:  Normal in size and appearance. Left epididymis:  Normal in size and appearance. Hydrocele:  Small bilateral hydroceles are noted. Varicocele:  None visualized. Pulsed Doppler interrogation of both testes demonstrates normal low resistance arterial and venous waveforms bilaterally. IMPRESSION: 1. Small bilateral hydroceles, otherwise unremarkable examination. 2.  Normal testicles bilaterally. No evidence of testicular torsion or mass. Electronically Signed   By: Margarette Canada M.D.   On: 01/18/2022 10:08    Procedures Procedures    Medications Ordered in ED Medications  alum & mag hydroxide-simeth (MAALOX/MYLANTA) 200-200-20 MG/5ML suspension 30 mL (30 mLs Oral Given 01/18/22 0911)    And  lidocaine (XYLOCAINE) 2 % viscous mouth solution 15 mL (15 mLs Oral Given 01/18/22 0911)  ondansetron (ZOFRAN) injection 4 mg (4 mg Intravenous Given 01/18/22 1009)  iohexol (OMNIPAQUE) 350 MG/ML injection 100 mL (55 mLs Intravenous Contrast Given 01/18/22 1037)    ED Course/ Medical Decision Making/ A&P Clinical Course as of 01/20/22 0754  Sun Jan 18, 2022   0954 D-Dimer, Quant(!): 1.00 [JL]  1104 Platelets(!): 75 [JL]    Clinical Course User Index [JL] Regan Lemming, MD   HEAR Score: 3                       Medical Decision Making Amount and/or Complexity of Data Reviewed Labs: ordered. Decision-making details documented in ED Course. Radiology: ordered. ECG/medicine tests: ordered.  Risk OTC drugs. Prescription drug management.   71 year old male with a history of hypertension and GERD presenting to the emergency department with intermittent chest pain that has been bothering him for the past year.  The patient states that he gets symptoms of reflux at night that worsen when he wakes up in the morning and persist throughout the morning.  Usually they resolve by mid afternoon.  He states that his chest pain symptoms are nonexertional.  Symptoms are described as a burning sensation.  Additionally, the patient states that he has had bilateral testicular pain with no dysuria.  He complains of these symptoms that have been intermittent for the past year but have worsened over the past few days.  Denies any lower extremity swelling.  Additionally, he occasionally gets short of breath and has a cough.  He denies any fevers or chills.  He does have a history of evaluation for bladder mass via cystoscopy which revealed no evidence of cancer via urology notes from 2022 in [place].  He currently denies any dysuria or difficulty voiding.  On arrival, the patient was afebrile, mildly bradycardic P 59, but no medically stable, saturating well on room air.  Sinus bradycardia noted on cardiac telemetry.  An EKG was performed which revealed no ischemic changes.  Differential diagnosis includes most likely GERD related to the patient's chest pain, less likely ACS, PE, pneumonia, pneumothorax, viral infection such as COVID-19 or influenza.  Differential diagnosis of the patient's testicular pain includes testicular mass/cancer, orchitis, less likely testicular  torsion.  Will evaluate further with screening labs to include delta troponins, testicular ultrasound and CT of the abdomen pelvis.    Laboratory work-up significant for a urinalysis that was negative for UTI, troponins negative, lipase negative, COVID-19 and influenza negative, BNP normal, CBC without a leukocytosis or anemia, thrombocytopenia noted to 75, CMP unremarkable with a mildly elevated T bili, D-dimer elevated to 1.0.  Patient underwent CTA of the chest which was  negative for acute PE, revealed coronary artery calcifications.  Ultrasound of the scrotum revealed small bilateral hydroceles, though otherwise unremarkable, normal testes bilaterally with no evidence of torsion or mass.  The patient had no tenderness of his epididymis on exam.  No evidence for orchitis or epididymitis at this time. A CT abdomen pelvis was performed which revealed no acute process in the abdomen or pelvis.  The patient has been appropriately medically screened and/or stabilized in the ED. I have low suspicion for any other emergent medical condition which would require further screening, evaluation or treatment in the ED or require inpatient management.   Informed the patient of his negative work-up at this time.  Will trial a prescription for Pepcid and Protonix.  DC Instructions: Your exam/testing today was overall reassuring.  Your EKG was unremarkable, your chest x-ray and CT scans did not reveal any acute abnormalities.  Your ultrasound of your scrotum was also reassuring.  Your laboratory work-up to include negative cardiac enzymes was reassuring.  I am not sure of the etiology of your scrotal pain.  There was no evidence of hernia on testicular exam.  Your CT scan was normal.  Your symptoms of chest discomfort are most consistent with reflux.  Recommend you continue taking your Pepcid tablet.  I have added a new prescription for Protonix which should take a couple of weeks to take effect.  Recommend you  follow-up with your PCP for recheck.  Final Clinical Impression(s) / ED Diagnoses Final diagnoses:  Chest pain, unspecified type  Gastroesophageal reflux disease, unspecified whether esophagitis present  Scrotal pain    Rx / DC Orders ED Discharge Orders          Ordered    famotidine (PEPCID) 40 MG tablet  Daily at bedtime        01/18/22 1116    pantoprazole (PROTONIX) 40 MG tablet  Daily        01/18/22 1116              Regan Lemming, MD 01/20/22 [phone]

## 2022-03-01 ENCOUNTER — Emergency Department (HOSPITAL_BASED_OUTPATIENT_CLINIC_OR_DEPARTMENT_OTHER): Payer: Medicare Other

## 2022-03-01 ENCOUNTER — Encounter (HOSPITAL_BASED_OUTPATIENT_CLINIC_OR_DEPARTMENT_OTHER): Payer: Self-pay | Admitting: Emergency Medicine

## 2022-03-01 ENCOUNTER — Other Ambulatory Visit: Payer: Self-pay

## 2022-03-01 ENCOUNTER — Emergency Department (HOSPITAL_BASED_OUTPATIENT_CLINIC_OR_DEPARTMENT_OTHER)
Admission: EM | Admit: 2022-03-01 | Discharge: 2022-03-01 | Disposition: A | Discharge status: Home / Self Care | Payer: Medicare Other | Attending: Emergency Medicine | Admitting: Emergency Medicine

## 2022-03-01 DIAGNOSIS — R079 Chest pain, unspecified: Secondary | ICD-10-CM | POA: Insufficient documentation

## 2022-03-01 DIAGNOSIS — R519 Headache, unspecified: Secondary | ICD-10-CM | POA: Diagnosis not present

## 2022-03-01 DIAGNOSIS — Z20822 Contact with and (suspected) exposure to covid-19: Secondary | ICD-10-CM | POA: Insufficient documentation

## 2022-03-01 DIAGNOSIS — I10 Essential (primary) hypertension: Secondary | ICD-10-CM | POA: Insufficient documentation

## 2022-03-01 DIAGNOSIS — M791 Myalgia, unspecified site: Secondary | ICD-10-CM | POA: Diagnosis not present

## 2022-03-01 DIAGNOSIS — R52 Pain, unspecified: Secondary | ICD-10-CM

## 2022-03-01 LAB — CBC WITH DIFFERENTIAL/PLATELET
Abs Immature Granulocytes: 0.01 10*3/uL (ref 0.00–0.07)
Basophils Absolute: 0 10*3/uL (ref 0.0–0.1)
Basophils Relative: 0 %
Eosinophils Absolute: 0 10*3/uL (ref 0.0–0.5)
Eosinophils Relative: 1 %
HCT: 39.9 % (ref 39.0–52.0)
Hemoglobin: 14.2 g/dL (ref 13.0–17.0)
Immature Granulocytes: 0 %
Lymphocytes Relative: 9 %
Lymphs Abs: 0.5 10*3/uL — ABNORMAL LOW (ref 0.7–4.0)
MCH: 32.1 pg (ref 26.0–34.0)
MCHC: 35.6 g/dL (ref 30.0–36.0)
MCV: 90.3 fL (ref 80.0–100.0)
Monocytes Absolute: 0.5 10*3/uL (ref 0.1–1.0)
Monocytes Relative: 9 %
Neutro Abs: 4.8 10*3/uL (ref 1.7–7.7)
Neutrophils Relative %: 81 %
Platelets: 109 10*3/uL — ABNORMAL LOW (ref 150–400)
RBC: 4.42 MIL/uL (ref 4.22–5.81)
RDW: 11.6 % (ref 11.5–15.5)
WBC: 5.9 10*3/uL (ref 4.0–10.5)
nRBC: 0 % (ref 0.0–0.2)

## 2022-03-01 LAB — COMPREHENSIVE METABOLIC PANEL
ALT: 23 U/L (ref 0–44)
AST: 22 U/L (ref 15–41)
Albumin: 4.1 g/dL (ref 3.5–5.0)
Alkaline Phosphatase: 61 U/L (ref 38–126)
Anion gap: 9 (ref 5–15)
BUN: 10 mg/dL (ref 8–23)
CO2: 24 mmol/L (ref 22–32)
Calcium: 8.3 mg/dL — ABNORMAL LOW (ref 8.9–10.3)
Chloride: 103 mmol/L (ref 98–111)
Creatinine, Ser: 0.82 mg/dL (ref 0.61–1.24)
GFR, Estimated: >60 mL/min (ref >60)
Glucose, Bld: 116 mg/dL — ABNORMAL HIGH (ref 70–99)
Potassium: 3.5 mmol/L (ref 3.5–5.1)
Sodium: 136 mmol/L (ref 135–145)
Total Bilirubin: 2 mg/dL — ABNORMAL HIGH (ref 0.3–1.2)
Total Protein: 6.5 g/dL (ref 6.5–8.1)

## 2022-03-01 LAB — TROPONIN I (HIGH SENSITIVITY): Troponin I (High Sensitivity): 4 ng/L (ref <18)

## 2022-03-01 LAB — RESP PANEL BY RT-PCR (FLU A&B, COVID) ARPGX2
Influenza A by PCR: NEGATIVE
Influenza B by PCR: NEGATIVE
SARS Coronavirus 2 by RT PCR: NEGATIVE

## 2022-03-01 MED ORDER — PROCHLORPERAZINE MALEATE 10 MG PO TABS: 10.0000 mg | ORAL_TABLET | Freq: Two times a day (BID) | ORAL | 0 refills | Status: DC | PRN

## 2022-03-01 MED ORDER — CYCLOBENZAPRINE HCL 10 MG PO TABS: 10.0000 mg | ORAL_TABLET | Freq: Two times a day (BID) | ORAL | 0 refills | Status: DC | PRN

## 2022-03-01 MED ORDER — PROCHLORPERAZINE MALEATE 10 MG PO TABS
10.0000 mg | ORAL_TABLET | Freq: Two times a day (BID) | ORAL | 0 refills | Status: DC | PRN
Start: 2022-03-01 — End: 2022-10-18

## 2022-03-01 MED ORDER — PROCHLORPERAZINE EDISYLATE 10 MG/2ML IJ SOLN
10.0000 mg | Freq: Once | INTRAMUSCULAR | Status: AC
  Administered 2022-03-01: 10 mg via INTRAVENOUS
  Filled 2022-03-01: qty 2

## 2022-03-01 MED ORDER — DIPHENHYDRAMINE HCL 50 MG/ML IJ SOLN
25.0000 mg | Freq: Once | INTRAMUSCULAR | Status: AC
Start: 2022-03-01 — End: 2022-03-01
  Administered 2022-03-01: 25 mg via INTRAVENOUS
  Filled 2022-03-01: qty 1

## 2022-03-01 NOTE — ED Triage Notes (Signed)
Using interpreter, pt endorses back pain, headache, and leg soreness for a few days that has worsened. Middle back pain and bilateral leg pain. Pt coughing during triage but states it has been going on for awhile. ?

## 2022-03-01 NOTE — ED Provider Notes (Signed)
[facility] EMERGENCY DEPT Provider Note   CSN: [id number] Arrival date & time: 03/01/22  3244     History  Chief Complaint  Patient presents with   Back Pain    Jorge [name] is a 71 y.o. male.  HPI     71yo male with history of hypertension presents with concern for headache and body aches.   Headache, comes and goes Whole body including hands, legs, back, everything hurting Has been going on for quite a long time but is worse today Worst pain started yesterday evening, was there before Experience pain left hand, fingers and leg Both hands and both legs with numbness for the last 3 days. No difficulty talking or walking, no visual changes or facial droop Pain goes up to a 10/10  No cough, fever, nausea, vomiting, diarrhea Has hx of chronic back pain, today headache is bigger concern Also reports chest pain on ROS which has been on and off, non exertional, everything hurts Hx of lumbar laminectomy/decompression  Used Nepali interpreter Past Medical History:  Diagnosis Date   Acid reflux    Arthritis    right leg   Cataract    bil removed   Hypertension      Past Medical History:  Diagnosis Date   Acid reflux    Arthritis    right leg   Cataract    bil removed   Hypertension     Past Surgical History:  Procedure Laterality Date   cataracts Bilateral    LUMBAR LAMINECTOMY/DECOMPRESSION MICRODISCECTOMY Right 12/25/2016   Procedure: Lumbar three-five Laminectomy, Right Lumbar three-four, Lumbar four-five Diskectomy ;  Surgeon: Kevan Ny Ditty, MD;  Location: [place] [facility];  Service: Neurosurgery;  Laterality: Right;      Home Medications Prior to Admission medications   Medication Sig Start Date End Date Taking? Authorizing Provider  aluminum-magnesium hydroxide-simethicone (MAALOX) 010-272-53 MG/5ML SUSP Take 15 mLs by mouth 3 (three) times daily as needed (indigestion).    [provider]  cyclobenzaprine (FLEXERIL) 10 MG tablet  Take 1 tablet (10 mg total) by mouth 2 (two) times daily as needed for muscle spasms. 03/01/22   Gareth Morgan, MD  famotidine (PEPCID) 40 MG tablet Take 1 tablet (40 mg total) by mouth at bedtime. 01/18/22 02/17/22  Regan Lemming, MD  HYDROcodone-acetaminophen (NORCO) 5-325 MG tablet Take 0.5 tablets by mouth every 4 (four) hours as needed (for pain; may cause constipation). 07/19/21   Molpus, John, MD  hyoscyamine (LEVSIN) 0.125 MG tablet Take 0.125 mg by mouth every 4 (four) hours as needed for cramping. 03/10/21   [provider]  KLOR-CON M20 20 MEQ tablet Take 20 mEq by mouth daily. 02/14/21   [provider]  levofloxacin (LEVAQUIN) 500 MG tablet Take 1 tablet daily starting 07/20/2021. 07/19/21   Molpus, Jenny Reichmann, MD  lisinopril (ZESTRIL) 10 MG tablet Take 10 mg by mouth daily. 12/31/20   [provider]  omeprazole (PRILOSEC) 40 MG capsule Take 40 mg by mouth daily. 03/24/21   [provider]  ondansetron (ZOFRAN-ODT) 4 MG disintegrating tablet Take 4 mg by mouth every 8 (eight) hours as needed for nausea or vomiting. 03/10/21   [provider]  pantoprazole (PROTONIX) 40 MG tablet Take 1 tablet (40 mg total) by mouth daily. 01/18/22 02/17/22  Regan Lemming, MD  prochlorperazine (COMPAZINE) 10 MG tablet Take 1 tablet (10 mg total) by mouth 2 (two) times daily as needed for nausea or vomiting (headache). 03/01/22   Gareth Morgan, MD  sucralfate (  CARAFATE) 1 GM/10ML suspension Take 1 mL by mouth 4 (four) times daily -  before meals and at bedtime. 03/04/21   [provider]  Vitamin D, Ergocalciferol, (DRISDOL) 1.25 MG (50000 UNIT) CAPS capsule Take 50,000 Units by mouth every Sunday. 10/24/20   [provider]      Allergies    Ivp dye [iodinated contrast media]    Review of Systems   Review of Systems See above  Physical Exam Updated Vital Signs BP 127/83    Pulse 77    Temp 98.4 F (36.9 C) (Oral)    Resp (!) 22    Ht '5\' 1"'$  (1.549 m)     Wt 68 kg    SpO2 98%    BMI 28.34 kg/m  Physical Exam Vitals and nursing note reviewed.  Constitutional:      General: He is not in acute distress.    Appearance: Normal appearance. He is well-developed. He is not ill-appearing or diaphoretic.  HENT:     Head: Normocephalic and atraumatic.  Eyes:     General: No visual field deficit.    Extraocular Movements: Extraocular movements intact.     Conjunctiva/sclera: Conjunctivae normal.     Pupils: Pupils are equal, round, and reactive to light.  Cardiovascular:     Rate and Rhythm: Normal rate and regular rhythm.     Pulses: Normal pulses.     Heart sounds: Normal heart sounds. No murmur heard.   No friction rub. No gallop.  Pulmonary:     Effort: Pulmonary effort is normal. No respiratory distress.     Breath sounds: Normal breath sounds. No wheezing or rales.  Abdominal:     General: There is no distension.     Palpations: Abdomen is soft.     Tenderness: There is no abdominal tenderness. There is no guarding.  Musculoskeletal:        General: No swelling or tenderness.     Cervical back: Normal range of motion.  Skin:    General: Skin is warm and dry.     Findings: No erythema or rash.  Neurological:     General: No focal deficit present.     Mental Status: He is alert and oriented to person, place, and time.     GCS: GCS eye subscore is 4. GCS verbal subscore is 5. GCS motor subscore is 6.     Cranial Nerves: No cranial nerve deficit, dysarthria or facial asymmetry.     Sensory: No sensory deficit (denies numbness on exam).     Motor: No weakness or tremor.     Coordination: Coordination normal. Finger-Nose-Finger Test normal.    ED Results / Procedures / Treatments   Labs (all labs ordered are listed, but only abnormal results are displayed) Labs Reviewed  CBC WITH DIFFERENTIAL/PLATELET - Abnormal; Notable for the following components:      Result Value   Platelets 109 (*)    Lymphs Abs 0.5 (*)    All other  components within normal limits  COMPREHENSIVE METABOLIC PANEL - Abnormal; Notable for the following components:   Glucose, Bld 116 (*)    Calcium 8.3 (*)    Total Bilirubin 2.0 (*)    All other components within normal limits  RESP PANEL BY RT-PCR (FLU A&B, COVID) ARPGX2  TROPONIN I (HIGH SENSITIVITY)    EKG EKG Interpretation  Date/Time:  Sunday March 01 2022 11:58:08 EST Ventricular Rate:  79 PR Interval:  189 QRS Duration: 87 QT Interval:  359 QTC Calculation: 412 R Axis:   41 Text Interpretation: Sinus rhythm Inferior infarct, old No significant change since last tracing Confirmed by Gareth Morgan [phone]) on 03/01/2022 12:56:58 PM  Radiology CT Head Wo Contrast  Result Date: 03/01/2022 CLINICAL DATA:  71 year old male with history of headache. EXAM: CT HEAD WITHOUT CONTRAST TECHNIQUE: Contiguous axial images were obtained from the base of the skull through the vertex without intravenous contrast. RADIATION DOSE REDUCTION: This exam was performed according to the departmental dose-optimization program which includes automated exposure control, adjustment of the mA and/or kV according to patient size and/or use of iterative reconstruction technique. COMPARISON:  No priors. FINDINGS: Brain: Patchy areas of decreased attenuation are noted throughout the deep and periventricular white matter of the cerebral hemispheres bilaterally, compatible with chronic microvascular ischemic disease. No evidence of acute infarction, hemorrhage, hydrocephalus, extra-axial collection or mass lesion/mass effect. Vascular: No hyperdense vessel or unexpected calcification. Skull: Normal. Negative for fracture or focal lesion. Sinuses/Orbits: No acute finding. Other: None. IMPRESSION: 1. No acute intracranial abnormalities. 2. Mild chronic microvascular ischemic changes in the cerebral white matter, as above. Electronically Signed   By: Vinnie Langton M.D.   On: 03/01/2022 12:02   DG Chest Portable 1  View  Result Date: 03/01/2022 CLINICAL DATA:  Diffuse pain including chest pain. Headache for 1 day. EXAM: PORTABLE CHEST 1 VIEW COMPARISON:  01/18/2022 FINDINGS: The heart size and mediastinal contours are within normal limits. Both lungs are clear. The visualized skeletal structures are unremarkable. IMPRESSION: No active disease. Electronically Signed   By: Kerby Moors M.D.   On: 03/01/2022 12:04    Procedures Procedures    Medications Ordered in ED Medications  prochlorperazine (COMPAZINE) injection 10 mg (10 mg Intravenous Given 03/01/22 1215)  diphenhydrAMINE (BENADRYL) injection 25 mg (25 mg Intravenous Given 03/01/22 1215)    ED Course/ Medical Decision Making/ A&P                           Medical Decision Making Amount and/or Complexity of Data Reviewed Labs: ordered. Radiology: ordered.  Risk Prescription drug management.    71yo male with history of hypertension presents with concern for headache and body aches.  Given age and significant headache, CT head completed which was evaluated by me shows no sign of intracranial hemorrhage.  Does not have focal neurologic signs or symptoms to suggest CVA. Do not feel LP indicated with low suspicion for occult SAH or meningitis.  For diffuse body aches and chest pain, ddx includes pneumonia, ACS, electrolyte abnormalities, anemia.  Low suspicion for PE by history and exam.  EKG evaluated by me without acute findings.  Troponin negative, doubt ACS. No significant electrolyte abnormalities and no anemia.  His headache is improved after headache cocktail. Possible viral etiology of combination of symptoms--COVID/flu negative. Recommend continued outpatient follow up. Given rx for compazine and flexeril for headache and muscle aches. Patient discharged in stable condition with understanding of reasons to return.          Final Clinical Impression(s) / ED Diagnoses Final diagnoses:  Acute nonintractable headache, unspecified  headache type  Body aches    Rx / DC Orders ED Discharge Orders          Ordered    cyclobenzaprine (FLEXERIL) 10 MG tablet  2 times daily PRN,   Status:  Discontinued        03/01/22 1309    prochlorperazine (COMPAZINE) 10 MG tablet  2  times daily PRN,   Status:  Discontinued        03/01/22 1309    prochlorperazine (COMPAZINE) 10 MG tablet  2 times daily PRN        03/01/22 1339    cyclobenzaprine (FLEXERIL) 10 MG tablet  2 times daily PRN        03/01/22 1339              Gareth Morgan, MD 03/02/22 2034

## 2022-04-01 ENCOUNTER — Encounter (HOSPITAL_BASED_OUTPATIENT_CLINIC_OR_DEPARTMENT_OTHER): Payer: Self-pay | Admitting: *Deleted

## 2022-04-01 ENCOUNTER — Emergency Department (HOSPITAL_BASED_OUTPATIENT_CLINIC_OR_DEPARTMENT_OTHER)
Admission: EM | Admit: 2022-04-01 | Discharge: 2022-04-01 | Disposition: A | Discharge status: Home / Self Care | Payer: Medicare Other | Attending: Emergency Medicine | Admitting: Emergency Medicine

## 2022-04-01 ENCOUNTER — Emergency Department (HOSPITAL_BASED_OUTPATIENT_CLINIC_OR_DEPARTMENT_OTHER): Payer: Medicare Other

## 2022-04-01 ENCOUNTER — Other Ambulatory Visit: Payer: Self-pay

## 2022-04-01 DIAGNOSIS — R350 Frequency of micturition: Secondary | ICD-10-CM | POA: Diagnosis not present

## 2022-04-01 DIAGNOSIS — K5900 Constipation, unspecified: Secondary | ICD-10-CM | POA: Insufficient documentation

## 2022-04-01 DIAGNOSIS — R1032 Left lower quadrant pain: Secondary | ICD-10-CM

## 2022-04-01 DIAGNOSIS — R300 Dysuria: Secondary | ICD-10-CM | POA: Diagnosis not present

## 2022-04-01 DIAGNOSIS — N3000 Acute cystitis without hematuria: Secondary | ICD-10-CM

## 2022-04-01 LAB — LIPASE, BLOOD: Lipase: 10 U/L — ABNORMAL LOW (ref 11–51)

## 2022-04-01 LAB — COMPREHENSIVE METABOLIC PANEL
ALT: 27 U/L (ref 0–44)
AST: 26 U/L (ref 15–41)
Albumin: 4.5 g/dL (ref 3.5–5.0)
Alkaline Phosphatase: 67 U/L (ref 38–126)
Anion gap: 9 (ref 5–15)
BUN: 8 mg/dL (ref 8–23)
CO2: 24 mmol/L (ref 22–32)
Calcium: 9.3 mg/dL (ref 8.9–10.3)
Chloride: 103 mmol/L (ref 98–111)
Creatinine, Ser: 0.85 mg/dL (ref 0.61–1.24)
GFR, Estimated: >60 mL/min (ref >60)
Glucose, Bld: 125 mg/dL — ABNORMAL HIGH (ref 70–99)
Potassium: 3.5 mmol/L (ref 3.5–5.1)
Sodium: 136 mmol/L (ref 135–145)
Total Bilirubin: 1.6 mg/dL — ABNORMAL HIGH (ref 0.3–1.2)
Total Protein: 7.3 g/dL (ref 6.5–8.1)

## 2022-04-01 LAB — CBC
HCT: 39.9 % (ref 39.0–52.0)
Hemoglobin: 14.4 g/dL (ref 13.0–17.0)
MCH: 31.9 pg (ref 26.0–34.0)
MCHC: 36.1 g/dL — ABNORMAL HIGH (ref 30.0–36.0)
MCV: 88.5 fL (ref 80.0–100.0)
Platelets: 145 10*3/uL — ABNORMAL LOW (ref 150–400)
RBC: 4.51 MIL/uL (ref 4.22–5.81)
RDW: 11.4 % — ABNORMAL LOW (ref 11.5–15.5)
WBC: 5.6 10*3/uL (ref 4.0–10.5)
nRBC: 0 % (ref 0.0–0.2)

## 2022-04-01 LAB — URINALYSIS, ROUTINE W REFLEX MICROSCOPIC
Bilirubin Urine: NEGATIVE
Glucose, UA: NEGATIVE mg/dL
Hgb urine dipstick: NEGATIVE
Ketones, ur: NEGATIVE mg/dL
Leukocytes,Ua: NEGATIVE
Nitrite: POSITIVE — AB
Protein, ur: NEGATIVE mg/dL
Specific Gravity, Urine: <1.005 — ABNORMAL LOW (ref 1.005–1.030)
pH: 7 (ref 5.0–8.0)

## 2022-04-01 MED ORDER — CEPHALEXIN 500 MG PO CAPS: 500.0000 mg | ORAL_CAPSULE | Freq: Three times a day (TID) | ORAL | 0 refills | Status: DC

## 2022-04-01 MED ORDER — SODIUM CHLORIDE 0.9 % IV BOLUS
1000.0000 mL | Freq: Once | INTRAVENOUS | Status: AC
  Administered 2022-04-01: 1000 mL via INTRAVENOUS

## 2022-04-01 MED ORDER — SODIUM CHLORIDE 0.9 % IV SOLN
1.0000 g | Freq: Once | INTRAVENOUS | Status: AC
  Administered 2022-04-01: 1 g via INTRAVENOUS
  Filled 2022-04-01: qty 10

## 2022-04-01 MED ORDER — CEPHALEXIN 500 MG PO CAPS
500.0000 mg | ORAL_CAPSULE | Freq: Three times a day (TID) | ORAL | 0 refills | Status: DC
Start: 2022-04-01 — End: 2022-10-18

## 2022-04-01 NOTE — Discharge Instructions (Signed)
Begin taking Keflex as prescribed. ? ?Follow-up with primary doctor if symptoms are not improving in the next few days, and return to the ER if symptoms significantly worsen or change. ?

## 2022-04-01 NOTE — ED Notes (Signed)
Patient transported to CT 

## 2022-04-01 NOTE — ED Triage Notes (Signed)
Via [place] interpreter, Pt has difficulty urinating and constipated. Painful urination. Last BM this morning "very little". Left lower abdominal pain. Denies fevers.  ?

## 2022-04-01 NOTE — ED Provider Notes (Signed)
?[facility] EMERGENC[facility] DEPT ?Provider Note ? ? ?CSN: [id number] ?Arrival date & time: 04/01/22  0017 ? ?  ? ?History ? ?Chief Complaint  ?Patient presents with  ? Constipation  ? ? ?Jorge [name] is a 71 y.o. male. ? ?Patient is a 71 year old male presenting for evaluation of left lower quadrant pain, urinary frequency, and feeling constipated.  This has been worsening over the past 2 days.  Last bowel movement was this morning, but was small.  He also describes feelings of urinary urgency.  He denies any fevers or chills.  He denies any bloody stool or urine.  His daughter gave him Azo with some relief. ? ?Patient speaks [place] and very little [place].  History taken with the translator tablet as well as the daughter who is present at bedside. ? ?The history is provided by the patient.  ? ?  ? ?Home Medications ?Prior to Admission medications   ?Medication Sig Start Date End Date Taking? Authorizing Provider  ?aluminum-magnesium hydroxide-simethicone (MAALOX) 694-854-62 MG/5ML SUSP Take 15 mLs by mouth 3 (three) times daily as needed (indigestion).    [provider]  ?cyclobenzaprine (FLEXERIL) 10 MG tablet Take 1 tablet (10 mg total) by mouth 2 (two) times daily as needed for muscle spasms. 03/01/22   Gareth Morgan, MD  ?famotidine (PEPCID) 40 MG tablet Take 1 tablet (40 mg total) by mouth at bedtime. 01/18/22 02/17/22  Regan Lemming, MD  ?HYDROcodone-acetaminophen (NORCO) 5-325 MG tablet Take 0.5 tablets by mouth every 4 (four) hours as needed (for pain; may cause constipation). 07/19/21   Molpus, John, MD  ?hyoscyamine (LEVSIN) 0.125 MG tablet Take 0.125 mg by mouth every 4 (four) hours as needed for cramping. 03/10/21   [provider]  ?KLOR-CON M20 20 MEQ tablet Take 20 mEq by mouth daily. 02/14/21   [provider]  ?levofloxacin (LEVAQUIN) 500 MG tablet Take 1 tablet daily starting 07/20/2021. 07/19/21   Molpus, John, MD  ?lisinopril (ZESTRIL) 10 MG tablet Take 10 mg by  mouth daily. 12/31/20   [provider]  ?omeprazole (PRILOSEC) 40 MG capsule Take 40 mg by mouth daily. 03/24/21   [provider]  ?ondansetron (ZOFRAN-ODT) 4 MG disintegrating tablet Take 4 mg by mouth every 8 (eight) hours as needed for nausea or vomiting. 03/10/21   [provider]  ?pantoprazole (PROTONIX) 40 MG tablet Take 1 tablet (40 mg total) by mouth daily. 01/18/22 02/17/22  Regan Lemming, MD  ?prochlorperazine (COMPAZINE) 10 MG tablet Take 1 tablet (10 mg total) by mouth 2 (two) times daily as needed for nausea or vomiting (headache). 03/01/22   Gareth Morgan, MD  ?sucralfate (CARAFATE) 1 GM/10ML suspension Take 1 mL by mouth 4 (four) times daily -  before meals and at bedtime. 03/04/21   [provider]  ?Vitamin D, Ergocalciferol, (DRISDOL) 1.25 MG (50000 UNIT) CAPS capsule Take 50,000 Units by mouth every Sunday. 10/24/20   [provider]  ?   ? ?Allergies    ?Ivp dye [iodinated contrast media]   ? ?Review of Systems   ?Review of Systems  ?All other systems reviewed and are negative. ? ?Physical Exam ?Updated Vital Signs ?BP (!) 161/84 (BP Location: Right Arm)   Pulse 72   Temp 97.8 ?F (36.6 ?C) (Oral)   Resp 16   SpO2 94%  ?Physical Exam ?Vitals and nursing note reviewed.  ?Constitutional:   ?   General: He is not in acute distress. ?   Appearance: He is well-developed. He is  not diaphoretic.  ?HENT:  ?   Head: Normocephalic and atraumatic.  ?Cardiovascular:  ?   Rate and Rhythm: Normal rate and regular rhythm.  ?   Heart sounds: No murmur heard. ?  No friction rub.  ?Pulmonary:  ?   Effort: Pulmonary effort is normal. No respiratory distress.  ?   Breath sounds: Normal breath sounds. No wheezing or rales.  ?Abdominal:  ?   General: Bowel sounds are normal. There is no distension.  ?   Palpations: Abdomen is soft.  ?   Tenderness: There is abdominal tenderness. There is no right CVA tenderness, left CVA tenderness, guarding or rebound.  ?   Comments: There  is tenderness to palpation in the left lower quadrant, but no rebound or guarding.  ?Musculoskeletal:     ?   General: Normal range of motion.  ?   Cervical back: Normal range of motion and neck supple.  ?Skin: ?   General: Skin is warm and dry.  ?Neurological:  ?   Mental Status: He is alert and oriented to person, place, and time.  ?   Coordination: Coordination normal.  ? ? ?ED Results / Procedures / Treatments   ?Labs ?(all labs ordered are listed, but only abnormal results are displayed) ?Labs Reviewed  ?URINALYSIS, ROUTINE W REFLEX MICROSCOPIC  ?LIPASE, BLOOD  ?COMPREHENSIVE METABOLIC PANEL  ?CBC  ? ? ?EKG ?None ? ?Radiology ?No results found. ? ?Procedures ?Procedures  ? ? ?Medications Ordered in ED ?Medications  ?sodium chloride 0.9 % bolus 1,000 mL (has no administration in time range)  ? ? ?ED Course/ Medical Decision Making/ A&P ? ?This patient presents to the ED for concern of abdominal pain and dysuria, this involves an extensive number of treatment options, and is a complaint that carries with it a high risk of complications and morbidity.  The differential diagnosis includes renal calculus, UTI, constipation, diverticulitis ? ? ?Co morbidities that complicate the patient evaluation ? ?None ? ? ?Additional history obtained: ? ?Additional history obtained from daughter who is present at bedside and acts as Optometrist ?No external records needed ? ? ?Lab Tests: ? ?I Ordered, and personally interpreted labs.  The pertinent results include: Unremarkable CBC, metabolic panel, and lipase.  Urinalysis is nitrite positive ? ? ?Imaging Studies ordered: ? ?I ordered imaging studies including CT scan with renal protocol ?I independently visualized and interpreted imaging which showed no acute intra-abdominal process ?I agree with the radiologist interpretation ? ? ?Cardiac Monitoring: / EKG: ? ?None performed ? ? ?Consultations Obtained: ? ?No consultations needed ? ? ?Problem List / ED Course / Critical  interventions / Medication management ? ?Patient presenting with dysuria and left lower quadrant pain.  He also describes constipation.  His CAT scan shows no evidence for increased stool burden or diverticulitis.  It shows no renal calculus or other acute intra-abdominal process.  His urinalysis is suggestive of a UTI.  Patient given Rocephin and will be discharged with Keflex.  He is to return as needed for any problems. ?Reevaluation of the patient after these medicines showed that the patient improved ?I have reviewed the patients home medicines and have made adjustments as needed ? ? ?Social Determinants of Health: ? ?Limited English, mainly speaks Nepali ? ? ?Test / Admission - Considered: ? ?Patient seems appropriate for discharge with antibiotics and as needed return ? ? ?Final Clinical Impression(s) / ED Diagnoses ?Final diagnoses:  ?None  ? ? ?Rx / DC Orders ?ED Discharge Orders   ? ?  None  ? ?  ? ? ?  ?Veryl Speak, MD ?04/01/22 0340 ? ?

## 2022-04-03 ENCOUNTER — Emergency Department (HOSPITAL_COMMUNITY)
Admission: EM | Admit: 2022-04-03 | Discharge: 2022-04-03 | Disposition: A | Discharge status: Home / Self Care | Payer: Medicare Other | Attending: Emergency Medicine | Admitting: Emergency Medicine

## 2022-04-03 ENCOUNTER — Encounter (HOSPITAL_COMMUNITY): Payer: Self-pay | Admitting: Emergency Medicine

## 2022-04-03 ENCOUNTER — Other Ambulatory Visit: Payer: Self-pay

## 2022-04-03 DIAGNOSIS — R1030 Lower abdominal pain, unspecified: Secondary | ICD-10-CM

## 2022-04-03 DIAGNOSIS — R350 Frequency of micturition: Secondary | ICD-10-CM | POA: Diagnosis not present

## 2022-04-03 DIAGNOSIS — R300 Dysuria: Secondary | ICD-10-CM | POA: Diagnosis not present

## 2022-04-03 DIAGNOSIS — R109 Unspecified abdominal pain: Secondary | ICD-10-CM | POA: Diagnosis present

## 2022-04-03 LAB — CBC WITH DIFFERENTIAL/PLATELET
Abs Immature Granulocytes: 0.01 10*3/uL (ref 0.00–0.07)
Basophils Absolute: 0 10*3/uL (ref 0.0–0.1)
Basophils Relative: 1 %
Eosinophils Absolute: 0.1 10*3/uL (ref 0.0–0.5)
Eosinophils Relative: 1 %
HCT: 42.9 % (ref 39.0–52.0)
Hemoglobin: 15.6 g/dL (ref 13.0–17.0)
Immature Granulocytes: 0 %
Lymphocytes Relative: 32 %
Lymphs Abs: 1.8 10*3/uL (ref 0.7–4.0)
MCH: 32.6 pg (ref 26.0–34.0)
MCHC: 36.4 g/dL — ABNORMAL HIGH (ref 30.0–36.0)
MCV: 89.6 fL (ref 80.0–100.0)
Monocytes Absolute: 0.5 10*3/uL (ref 0.1–1.0)
Monocytes Relative: 9 %
Neutro Abs: 3.1 10*3/uL (ref 1.7–7.7)
Neutrophils Relative %: 57 %
Platelets: 173 10*3/uL (ref 150–400)
RBC: 4.79 MIL/uL (ref 4.22–5.81)
RDW: 11.7 % (ref 11.5–15.5)
WBC: 5.4 10*3/uL (ref 4.0–10.5)
nRBC: 0 % (ref 0.0–0.2)

## 2022-04-03 LAB — URINALYSIS, ROUTINE W REFLEX MICROSCOPIC
Bilirubin Urine: NEGATIVE
Glucose, UA: NEGATIVE mg/dL
Hgb urine dipstick: NEGATIVE
Ketones, ur: NEGATIVE mg/dL
Leukocytes,Ua: NEGATIVE
Nitrite: NEGATIVE
Protein, ur: NEGATIVE mg/dL
Specific Gravity, Urine: 1.003 — ABNORMAL LOW (ref 1.005–1.030)
pH: 7 (ref 5.0–8.0)

## 2022-04-03 LAB — COMPREHENSIVE METABOLIC PANEL
ALT: 33 U/L (ref 0–44)
AST: 55 U/L — ABNORMAL HIGH (ref 15–41)
Albumin: 4.2 g/dL (ref 3.5–5.0)
Alkaline Phosphatase: 76 U/L (ref 38–126)
Anion gap: 7 (ref 5–15)
BUN: 6 mg/dL — ABNORMAL LOW (ref 8–23)
CO2: 21 mmol/L — ABNORMAL LOW (ref 22–32)
Calcium: 8.8 mg/dL — ABNORMAL LOW (ref 8.9–10.3)
Chloride: 107 mmol/L (ref 98–111)
Creatinine, Ser: 0.83 mg/dL (ref 0.61–1.24)
GFR, Estimated: >60 mL/min (ref >60)
Glucose, Bld: 109 mg/dL — ABNORMAL HIGH (ref 70–99)
Potassium: 5.5 mmol/L — ABNORMAL HIGH (ref 3.5–5.1)
Sodium: 135 mmol/L (ref 135–145)
Total Bilirubin: 2.4 mg/dL — ABNORMAL HIGH (ref 0.3–1.2)
Total Protein: 6.9 g/dL (ref 6.5–8.1)

## 2022-04-03 LAB — LIPASE, BLOOD: Lipase: 25 U/L (ref 11–51)

## 2022-04-03 MED ORDER — ALUM & MAG HYDROXIDE-SIMETH 200-200-20 MG/5ML PO SUSP
30.0000 mL | Freq: Once | ORAL | Status: AC
Start: 2022-04-03 — End: 2022-04-03
  Administered 2022-04-03: 30 mL via ORAL
  Filled 2022-04-03: qty 30

## 2022-04-03 MED ORDER — SODIUM CHLORIDE 0.9 % IV BOLUS
1000.0000 mL | Freq: Once | INTRAVENOUS | Status: AC
Start: 2022-04-03 — End: 2022-04-03
  Administered 2022-04-03: 1000 mL via INTRAVENOUS

## 2022-04-03 NOTE — ED Provider Notes (Signed)
?[facility] ?Provider Note ? ? ?CSN: [id number] ?Arrival date & time: 04/03/22  0424 ? ?  ? ?History ? ?Chief Complaint  ?Patient presents with  ? Abdominal Pain  ? ? ?Jorge [name] is a 71 y.o. male. ? ?71 yo M with a chief complaints of abdominal discomfort.  He tells me has been going on for at least 5 years but things been worsening over the past 4 days or so.  He has had a procedure on his bladder in the past year but denies other abdominal surgery.  Denies nausea or vomiting denies fevers.  He was started on antibiotics a day ago and denies any significant improvement.  He feels like his abdominal discomfort is usually low but has had some more to the right upper area for the past couple days.  Not worse with eating and drinking. ? ? ?Abdominal Pain ? ?  ? ?Home Medications ?Prior to Admission medications   ?Medication Sig Start Date End Date Taking? Authorizing Provider  ?aluminum-magnesium hydroxide-simethicone (MAALOX) 631-497-02 MG/5ML SUSP Take 15 mLs by mouth 3 (three) times daily as needed (indigestion).    [provider]  ?cephALEXin (KEFLEX) 500 MG capsule Take 1 capsule (500 mg total) by mouth 3 (three) times daily. 04/01/22   Veryl Speak, MD  ?cyclobenzaprine (FLEXERIL) 10 MG tablet Take 1 tablet (10 mg total) by mouth 2 (two) times daily as needed for muscle spasms. 03/01/22   Gareth Morgan, MD  ?famotidine (PEPCID) 40 MG tablet Take 1 tablet (40 mg total) by mouth at bedtime. 01/18/22 02/17/22  Regan Lemming, MD  ?HYDROcodone-acetaminophen (NORCO) 5-325 MG tablet Take 0.5 tablets by mouth every 4 (four) hours as needed (for pain; may cause constipation). 07/19/21   Molpus, John, MD  ?hyoscyamine (LEVSIN) 0.125 MG tablet Take 0.125 mg by mouth every 4 (four) hours as needed for cramping. 03/10/21   [provider]  ?KLOR-CON M20 20 MEQ tablet Take 20 mEq by mouth daily. 02/14/21   [provider]  ?levofloxacin (LEVAQUIN) 500 MG tablet  Take 1 tablet daily starting 07/20/2021. 07/19/21   Molpus, John, MD  ?lisinopril (ZESTRIL) 10 MG tablet Take 10 mg by mouth daily. 12/31/20   [provider]  ?omeprazole (PRILOSEC) 40 MG capsule Take 40 mg by mouth daily. 03/24/21   [provider]  ?ondansetron (ZOFRAN-ODT) 4 MG disintegrating tablet Take 4 mg by mouth every 8 (eight) hours as needed for nausea or vomiting. 03/10/21   [provider]  ?pantoprazole (PROTONIX) 40 MG tablet Take 1 tablet (40 mg total) by mouth daily. 01/18/22 02/17/22  Regan Lemming, MD  ?prochlorperazine (COMPAZINE) 10 MG tablet Take 1 tablet (10 mg total) by mouth 2 (two) times daily as needed for nausea or vomiting (headache). 03/01/22   Gareth Morgan, MD  ?sucralfate (CARAFATE) 1 GM/10ML suspension Take 1 mL by mouth 4 (four) times daily -  before meals and at bedtime. 03/04/21   [provider]  ?Vitamin D, Ergocalciferol, (DRISDOL) 1.25 MG (50000 UNIT) CAPS capsule Take 50,000 Units by mouth every Sunday. 10/24/20   [provider]  ?   ? ?Allergies    ?Ivp dye [iodinated contrast media]   ? ?Review of Systems   ?Review of Systems  ?Gastrointestinal:  Positive for abdominal pain.  ? ?Physical Exam ?Updated Vital Signs ?BP (!) 147/85 (BP Location: Left Arm)   Pulse 69   Temp 97.9 ?F (36.6 ?C) (Oral)   Resp 18   Ht  $'5\' 1"'L$  (1.549 m)   Wt 68 kg   SpO2 97%   BMI 28.33 kg/m?  ?Physical Exam ?Vitals and nursing note reviewed.  ?Constitutional:   ?   Appearance: He is well-developed.  ?HENT:  ?   Head: Normocephalic and atraumatic.  ?Eyes:  ?   Pupils: Pupils are equal, round, and reactive to light.  ?Neck:  ?   Vascular: No JVD.  ?Cardiovascular:  ?   Rate and Rhythm: Normal rate and regular rhythm.  ?   Heart sounds: No murmur heard. ?  No friction rub. No gallop.  ?Pulmonary:  ?   Effort: No respiratory distress.  ?   Breath sounds: No wheezing.  ?Abdominal:  ?   General: There is no distension.  ?   Tenderness: There is abdominal  tenderness. There is no guarding or rebound.  ?   Comments: Mild tenderness lower in the abdomen.  ?Musculoskeletal:     ?   General: Normal range of motion.  ?   Cervical back: Normal range of motion and neck supple.  ?Skin: ?   Coloration: Skin is not pale.  ?   Findings: No rash.  ?Neurological:  ?   Mental Status: He is alert and oriented to person, place, and time.  ?Psychiatric:     ?   Behavior: Behavior normal.  ? ? ?ED Results / Procedures / Treatments   ?Labs ?(all labs ordered are listed, but only abnormal results are displayed) ?Labs Reviewed  ?URINALYSIS, ROUTINE W REFLEX MICROSCOPIC - Abnormal; Notable for the following components:  ?    Result Value  ? Color, Urine STRAW (*)   ? Specific Gravity, Urine 1.003 (*)   ? All other components within normal limits  ?CBC WITH DIFFERENTIAL/PLATELET - Abnormal; Notable for the following components:  ? MCHC 36.4 (*)   ? All other components within normal limits  ?COMPREHENSIVE METABOLIC PANEL - Abnormal; Notable for the following components:  ? Potassium 5.5 (*)   ? CO2 21 (*)   ? Glucose, Bld 109 (*)   ? BUN 6 (*)   ? Calcium 8.8 (*)   ? AST 55 (*)   ? Total Bilirubin 2.4 (*)   ? All other components within normal limits  ?URINE CULTURE  ?LIPASE, BLOOD  ? ? ?EKG ?None ? ?Radiology ?No results found. ? ?Procedures ?Procedures  ? ? ?Medications Ordered in ED ?Medications  ?sodium chloride 0.9 % bolus 1,000 mL (1,000 mLs Intravenous New Bag/Given 04/03/22 0810)  ?alum & mag hydroxide-simeth (MAALOX/MYLANTA) 200-200-20 MG/5ML suspension 30 mL (30 mLs Oral Given 04/03/22 0754)  ? ? ?ED Course/ Medical Decision Making/ A&P ?  ?                        ?Medical Decision Making ?Amount and/or Complexity of Data Reviewed ?Labs: ordered. ? ?Risk ?OTC drugs. ? ? ?71 yo M with a chief complaints of dysuria increased frequency going on for a few days now.  Was recently seen in the ED and had blood work and CT imaging.  He was started on antibiotics for possible urinary tract  infection.  UA today without obvious sites of infection.  We will send off for culture.  We will repeat blood work.  Feel low utility with repeat imaging we will hold off at this time.  He has some mild lower abdominal discomfort on exam but no focal pain in the right upper quadrant.  Negative Murphy sign. ? ?  LFTs and lipase are unremarkable.  No other concerning finding on blood work.  I discussed results with patient.  We will have him follow-up with his family doctor.  Again I encouraged him to follow-up with a urologist in the office with recurrent urinary symptoms. ? ?9:20 AM:  I have discussed the diagnosis/risks/treatment options with the patient.  Evaluation and diagnostic testing in the emergency department does not suggest an emergent condition requiring admission or immediate intervention beyond what has been performed at this time.  They will follow up with  PCP, urology. We also discussed returning to the ED immediately if new or worsening sx occur. We discussed the sx which are most concerning (e.g., sudden worsening pain, fever, inability to tolerate by mouth) that necessitate immediate return. Medications administered to the patient during their visit and any new prescriptions provided to the patient are listed below. ? ?Medications given during this visit ?Medications  ?sodium chloride 0.9 % bolus 1,000 mL (1,000 mLs Intravenous New Bag/Given 04/03/22 0810)  ?alum & mag hydroxide-simeth (MAALOX/MYLANTA) 200-200-20 MG/5ML suspension 30 mL (30 mLs Oral Given 04/03/22 0754)  ? ? ? ?The patient appears reasonably screen and/or stabilized for discharge and I doubt any other medical condition or other [facility] requiring further screening, evaluation, or treatment in the ED at this time prior to discharge.  ? ? ? ? ? ? ? ? ?Final Clinical Impression(s) / ED Diagnoses ?Final diagnoses:  ?Lower abdominal pain  ? ? ?Rx / DC Orders ?ED Discharge Orders   ? ? None  ? ?  ? ? ?  ?Deno Etienne, DO ?04/03/22 0920 ? ?

## 2022-04-03 NOTE — ED Triage Notes (Signed)
[place] speaker pt arrive POV from home. Interpreter service used for triage and assessment by PA. Lower abd pain for the past 3 days.  ?

## 2022-04-03 NOTE — Discharge Instructions (Signed)
There was no obvious concerning finding on your blood work.  You had a CT scan that was done yesterday that did not suggest any acute pathology.  I think you would benefit most by seeing the urologist in the office.  Please give them a call and see when he can see them.  Your urine did not show signs of infection but I did send it off for culture.  If it is positive someone might call you to discuss the results. ? ?Take tylenol '1000mg'$ (2 extra strength) four times a day. 3 ? ???????? ????? ????? ???? ?????? ????????? ???? ?????? ????? ?????? CT ??????? ???? ??? ???? ?????? ???? ???? ???? ????? ?????????? ????? ??????? ???? ????? ?? ?????? ?????? ?????????????? ????? ???? ????? ?????????? ????? ?????????? ?? ???????? ? ?????????? ?? ?????? ?????????? ????? ????? ??????????? ?????? ??????? ????????? ????? ?????? ?? ???? ????? ?????????? ???? ?????? ??? ?? ????????? ? ??? ????? ???????? ???????? ???? ???? ?? ???? ????? ? ?tylenol '1000mg'$  (2 ???????? ?????) ????? ??? ??? ????????? ? ? ?

## 2022-04-03 NOTE — ED Provider Triage Note (Signed)
Emergency Medicine Provider Triage Evaluation Note ? ?Jorge [name] , a 71 y.o. male  was evaluated in triage.  Pt complains of urinary retention, burning when he urinates.  History provided via [place] interpreter.  Patient reports abdominal distention, poor urine flow and burning when he urinates for the last 3 to 4 days.  Per chart review, he was seen in the ER on 4/5, had a CT renal study which was negative and a UA suggestive of cystitis with positive nitrites.  He was given antibiotics which she has been taking but he continues to have difficulty urinating.  He denies any fevers or chills.  No nausea or vomiting.  His son at bedside states that he has had a Foley in the past which did help him, however after the removal of the Foley he seems to have these recurring symptoms.  He appears to have been followed by urologist in [place] and had a cystoscopy back in September 2022.  Per chart review, history of BPH ? ?Review of Systems  ?Positive: As above ?Negative: As above ? ?Physical Exam  ?BP 135/89 (BP Location: Left Arm)   Pulse 70   Temp 98 ?F (36.7 ?C)   Resp 16   Ht '5\' 1"'$  (1.549 m)   Wt 68 kg   SpO2 95%   BMI 28.33 kg/m?  ?Gen:   Awake, no distress   ?Resp:  Normal effort  ?MSK:   Moves extremities without difficulty  ?Other:  Abdomen soft, slightly distended, mildly tender in the left lower and right lower quadrants ? ?Medical Decision Making  ?Medically screening exam initiated at 4:42 AM.  Appropriate orders placed.  Jorge [name] was informed that the remainder of the evaluation will be completed by another provider, this initial triage assessment does not replace that evaluation, and the importance of remaining in the ED until their evaluation is complete. ? ? ?  ?Garald Balding, PA-C ?04/03/22 0445 ? ?

## 2022-04-04 LAB — URINE CULTURE: Culture: NO GROWTH

## 2022-04-26 ENCOUNTER — Emergency Department (HOSPITAL_BASED_OUTPATIENT_CLINIC_OR_DEPARTMENT_OTHER): Payer: Medicare Other

## 2022-04-26 ENCOUNTER — Encounter (HOSPITAL_BASED_OUTPATIENT_CLINIC_OR_DEPARTMENT_OTHER): Payer: Self-pay

## 2022-04-26 ENCOUNTER — Other Ambulatory Visit: Payer: Self-pay

## 2022-04-26 ENCOUNTER — Emergency Department (HOSPITAL_BASED_OUTPATIENT_CLINIC_OR_DEPARTMENT_OTHER)
Admission: EM | Admit: 2022-04-26 | Discharge: 2022-04-26 | Disposition: A | Discharge status: Home / Self Care | Payer: Medicare Other | Attending: Emergency Medicine | Admitting: Emergency Medicine

## 2022-04-26 DIAGNOSIS — Z79899 Other long term (current) drug therapy: Secondary | ICD-10-CM | POA: Insufficient documentation

## 2022-04-26 DIAGNOSIS — I10 Essential (primary) hypertension: Secondary | ICD-10-CM | POA: Insufficient documentation

## 2022-04-26 DIAGNOSIS — N433 Hydrocele, unspecified: Secondary | ICD-10-CM | POA: Insufficient documentation

## 2022-04-26 DIAGNOSIS — N50811 Right testicular pain: Secondary | ICD-10-CM | POA: Diagnosis present

## 2022-04-26 LAB — URINALYSIS, ROUTINE W REFLEX MICROSCOPIC
Bilirubin Urine: NEGATIVE
Glucose, UA: NEGATIVE mg/dL
Hgb urine dipstick: NEGATIVE
Ketones, ur: NEGATIVE mg/dL
Leukocytes,Ua: NEGATIVE
Nitrite: NEGATIVE
Protein, ur: NEGATIVE mg/dL
Specific Gravity, Urine: 1.01 (ref 1.005–1.030)
pH: 6.5 (ref 5.0–8.0)

## 2022-04-26 MED ORDER — IBUPROFEN 800 MG PO TABS
800.0000 mg | ORAL_TABLET | Freq: Once | ORAL | Status: AC
  Administered 2022-04-26: 800 mg via ORAL
  Filled 2022-04-26: qty 1

## 2022-04-26 NOTE — ED Triage Notes (Signed)
He c/o right testicular pain and slight swelling x 3 days. He denies urinary hesitancy and dysuria and is in no distress. ?

## 2022-04-26 NOTE — ED Notes (Signed)
Discharge paperwork given and understood. 

## 2022-04-26 NOTE — Discharge Instructions (Addendum)
Your urinalysis was negative for evidence of urinary tract infection.  Your ultrasound did not reveal evidence of infection in your scrotum or epididymis.  You do have evidence of fluid in your scrotum called a hydrocele.  This warrants follow-up with urology.  Recommend NSAIDs for pain control, gentle elevation of the scrotum for comfort. ?

## 2022-04-26 NOTE — ED Provider Notes (Signed)
[facility] EMERGENCY DEPT Provider Note   CSN: [id number] Arrival date & time: 04/26/22  1324     History  Chief Complaint  Patient presents with   Testicle Pain    Jorge [name] is a 71 y.o. male.   Testicle Pain    71 year old male with medical hx significant for HTN, GERD, arthritis, cataracts who presents with testicular pain and swelling. He has been having right sided testicular pain and swelling for the past three days. The history was provided with the aid of a family member for interpretation as a Nepali teleinterpreter was not immediately available. The patient primarily endorses the pain in his right testicle. No dysuria or increased frequency. No mass in his scrotum. Pain radiates to his abdomen. No fevers or chills.   Home Medications Prior to Admission medications   Medication Sig Start Date End Date Taking? Authorizing Provider  aluminum-magnesium hydroxide-simethicone (MAALOX) 200-200-20 MG/5ML SUSP Take 15 mLs by mouth 3 (three) times daily as needed (indigestion).    [provider]  cephALEXin (KEFLEX) 500 MG capsule Take 1 capsule (500 mg total) by mouth 3 (three) times daily. 04/01/22   Geoffery Lyons, MD  cyclobenzaprine (FLEXERIL) 10 MG tablet Take 1 tablet (10 mg total) by mouth 2 (two) times daily as needed for muscle spasms. 03/01/22   Alvira Monday, MD  famotidine (PEPCID) 40 MG tablet Take 1 tablet (40 mg total) by mouth at bedtime. 01/18/22 02/17/22  Ernie Avena, MD  HYDROcodone-acetaminophen (NORCO) 5-325 MG tablet Take 0.5 tablets by mouth every 4 (four) hours as needed (for pain; may cause constipation). 07/19/21   Molpus, John, MD  hyoscyamine (LEVSIN) 0.125 MG tablet Take 0.125 mg by mouth every 4 (four) hours as needed for cramping. 03/10/21   [provider]  KLOR-CON M20 20 MEQ tablet Take 20 mEq by mouth daily. 02/14/21   [provider]  levofloxacin (LEVAQUIN) 500 MG tablet Take 1 tablet daily starting  07/20/2021. 07/19/21   Molpus, Jonny Ruiz, MD  lisinopril (ZESTRIL) 10 MG tablet Take 10 mg by mouth daily. 12/31/20   [provider]  omeprazole (PRILOSEC) 40 MG capsule Take 40 mg by mouth daily. 03/24/21   [provider]  ondansetron (ZOFRAN-ODT) 4 MG disintegrating tablet Take 4 mg by mouth every 8 (eight) hours as needed for nausea or vomiting. 03/10/21   [provider]  pantoprazole (PROTONIX) 40 MG tablet Take 1 tablet (40 mg total) by mouth daily. 01/18/22 02/17/22  Ernie Avena, MD  prochlorperazine (COMPAZINE) 10 MG tablet Take 1 tablet (10 mg total) by mouth 2 (two) times daily as needed for nausea or vomiting (headache). 03/01/22   Alvira Monday, MD  sucralfate (CARAFATE) 1 GM/10ML suspension Take 1 mL by mouth 4 (four) times daily -  before meals and at bedtime. 03/04/21   [provider]  Vitamin D, Ergocalciferol, (DRISDOL) 1.25 MG (50000 UNIT) CAPS capsule Take 50,000 Units by mouth every Sunday. 10/24/20   [provider]      Allergies    Ivp dye [iodinated contrast media]    Review of Systems   Review of Systems  Genitourinary:  Positive for scrotal swelling and testicular pain. Negative for dysuria.  All other systems reviewed and are negative.  Physical Exam Updated Vital Signs BP 139/75 (BP Location: Right Arm)   Pulse 60   Temp (!) 97.5 F (36.4 C) (Oral)   Resp 15   SpO2 96%  Physical Exam Vitals and nursing note reviewed.  Exam conducted with a chaperone present.  Constitutional:      General: He is not in acute distress. HENT:     Head: Normocephalic and atraumatic.  Eyes:     Conjunctiva/sclera: Conjunctivae normal.     Pupils: Pupils are equal, round, and reactive to light.  Cardiovascular:     Rate and Rhythm: Normal rate and regular rhythm.  Pulmonary:     Effort: Pulmonary effort is normal. No respiratory distress.  Abdominal:     General: There is no distension.     Tenderness: There is no guarding.      Hernia: There is no hernia in the left inguinal area or right inguinal area.  Genitourinary:    Penis: Uncircumcised. No discharge.      Testes:        Right: Tenderness and swelling present. Mass not present.        Left: Mass, tenderness or swelling not present.     Epididymis:     Right: Tenderness present.     Left: No tenderness.  Musculoskeletal:        General: No deformity or signs of injury.     Cervical back: Neck supple.  Skin:    Findings: No lesion or rash.  Neurological:     General: No focal deficit present.     Mental Status: He is alert. Mental status is at baseline.    ED Results / Procedures / Treatments   Labs (all labs ordered are listed, but only abnormal results are displayed) Labs Reviewed  URINALYSIS, ROUTINE W REFLEX MICROSCOPIC - Abnormal; Notable for the following components:      Result Value   Color, Urine COLORLESS (*)    All other components within normal limits    EKG None  Radiology US SCROTUM W/DOPPLER  Result Date: 04/26/2022 CLINICAL DATA:  Right testicular pain and swelling for 3 days EXAM: SCROTAL ULTRASOUND DOPPLER ULTRASOUND OF THE TESTICLES TECHNIQUE: Complete ultrasound examination of the testicles, epididymis, and other scrotal structures was performed. Color and spectral Doppler ultrasound were also utilized to evaluate blood flow to the testicles. COMPARISON:  01/18/2022 FINDINGS: Right testicle Measurements: 4.4 x 2.3 x 3.1 cm. No mass or microlithiasis visualized. Left testicle Measurements: 4.2 x 2.5 x 2.2 cm. No mass or microlithiasis visualized. Right epididymis:  Normal in size and appearance. Left epididymis:  Normal in size and appearance. Hydrocele:  Small right hydrocele. Varicocele:  None visualized. Pulsed Doppler interrogation of both testes demonstrates normal low resistance arterial and venous waveforms bilaterally. IMPRESSION: 1. Negative for testicular torsion or intratesticular mass. 2. Small right hydrocele.  Electronically Signed   By: Duanne Guess D.O.   On: 04/26/2022 11:04    Procedures Procedures    Medications Ordered in ED Medications  ibuprofen (ADVIL) tablet 800 mg (has no administration in time range)    ED Course/ Medical Decision Making/ A&P                           Medical Decision Making Amount and/or Complexity of Data Reviewed Labs: ordered. Radiology: ordered.  Risk Prescription drug management.    71 year old male with medical hx significant for HTN, GERD, arthritis, cataracts who presents with testicular pain and swelling. He has been having right sided testicular pain and swelling for the past three days. The history was provided with the aid of a family member for interpretation as a [place] teleinterpreter was not immediately available. The  patient primarily endorses the pain in his right testicle. No dysuria or increased frequency. No mass in his scrotum. Pain radiates to his abdomen. No fevers or chills.   On arrival, the patient was vitally stable.  Physical exam significant for tenderness and swelling of the right testicle with some tenderness of the epididymis.  No evidence of inguinal hernia bilaterally.  Primarily concern for epididymitis or orchitis.  Additionally considered testicular torsion, UTI.  The patient's urinalysis was negative for signs of urinary tract infection.  He was provided with Motrin for pain control.  An ultrasound of the scrotum with Doppler was performed and results are as follows: IMPRESSION:  1. Negative for testicular torsion or intratesticular mass.  2. Small right hydrocele.    Based on this work-up, no evidence for epididymitis or orchitis.  No evidence for testicular torsion.  No evidence for UTI.  Symptoms of mild discomfort and swelling most consistent with the finding of hydrocele.  A referral was placed for follow-up with urology.  As the patient's urine is not infected and there is no evidence of epididymitis or  orchitis on ultrasound will defer antibiotic treatment at this time.  The patient was advised elevation of the scrotum gently, NSAIDs for pain control.  Stable for discharge.  Final Clinical Impression(s) / ED Diagnoses Final diagnoses:  Right hydrocele    Rx / DC Orders ED Discharge Orders          Ordered    Ambulatory referral to Urology        04/26/22 1114              Ernie Avena, MD 04/26/22 1117

## 2022-07-23 ENCOUNTER — Encounter: Payer: Self-pay | Admitting: Gastroenterology

## 2022-09-20 ENCOUNTER — Emergency Department (HOSPITAL_BASED_OUTPATIENT_CLINIC_OR_DEPARTMENT_OTHER)
Admission: EM | Admit: 2022-09-20 | Discharge: 2022-09-20 | Disposition: A | Discharge status: Home / Self Care | Payer: Medicare Other | Attending: Emergency Medicine | Admitting: Emergency Medicine

## 2022-09-20 ENCOUNTER — Emergency Department (HOSPITAL_BASED_OUTPATIENT_CLINIC_OR_DEPARTMENT_OTHER): Payer: Medicare Other | Admitting: Radiology

## 2022-09-20 ENCOUNTER — Other Ambulatory Visit: Payer: Self-pay

## 2022-09-20 ENCOUNTER — Encounter (HOSPITAL_BASED_OUTPATIENT_CLINIC_OR_DEPARTMENT_OTHER): Payer: Self-pay | Admitting: *Deleted

## 2022-09-20 DIAGNOSIS — Z20822 Contact with and (suspected) exposure to covid-19: Secondary | ICD-10-CM | POA: Insufficient documentation

## 2022-09-20 DIAGNOSIS — D696 Thrombocytopenia, unspecified: Secondary | ICD-10-CM | POA: Diagnosis not present

## 2022-09-20 DIAGNOSIS — M791 Myalgia, unspecified site: Secondary | ICD-10-CM | POA: Diagnosis present

## 2022-09-20 DIAGNOSIS — I10 Essential (primary) hypertension: Secondary | ICD-10-CM | POA: Diagnosis not present

## 2022-09-20 DIAGNOSIS — J069 Acute upper respiratory infection, unspecified: Secondary | ICD-10-CM

## 2022-09-20 DIAGNOSIS — Z79899 Other long term (current) drug therapy: Secondary | ICD-10-CM | POA: Diagnosis not present

## 2022-09-20 DIAGNOSIS — R252 Cramp and spasm: Secondary | ICD-10-CM

## 2022-09-20 LAB — CBC
HCT: 41.3 % (ref 39.0–52.0)
Hemoglobin: 14.2 g/dL (ref 13.0–17.0)
MCH: 30 pg (ref 26.0–34.0)
MCHC: 34.4 g/dL (ref 30.0–36.0)
MCV: 87.1 fL (ref 80.0–100.0)
Platelets: 148 10*3/uL — ABNORMAL LOW (ref 150–400)
RBC: 4.74 MIL/uL (ref 4.22–5.81)
RDW: 11.8 % (ref 11.5–15.5)
WBC: 3.5 10*3/uL — ABNORMAL LOW (ref 4.0–10.5)
nRBC: 0 % (ref 0.0–0.2)

## 2022-09-20 LAB — BASIC METABOLIC PANEL
Anion gap: 8 (ref 5–15)
BUN: 8 mg/dL (ref 8–23)
CO2: 26 mmol/L (ref 22–32)
Calcium: 9.2 mg/dL (ref 8.9–10.3)
Chloride: 104 mmol/L (ref 98–111)
Creatinine, Ser: 0.89 mg/dL (ref 0.61–1.24)
GFR, Estimated: >60 mL/min (ref >60)
Glucose, Bld: 105 mg/dL — ABNORMAL HIGH (ref 70–99)
Potassium: 3.9 mmol/L (ref 3.5–5.1)
Sodium: 138 mmol/L (ref 135–145)

## 2022-09-20 LAB — RESP PANEL BY RT-PCR (FLU A&B, COVID) ARPGX2
Influenza A by PCR: NEGATIVE
Influenza B by PCR: NEGATIVE
SARS Coronavirus 2 by RT PCR: NEGATIVE

## 2022-09-20 LAB — MAGNESIUM: Magnesium: 2 mg/dL (ref 1.7–2.4)

## 2022-09-20 LAB — CK: Total CK: 288 U/L (ref 49–397)

## 2022-09-20 MED ORDER — ACETAMINOPHEN 500 MG PO TABS
1000.0000 mg | ORAL_TABLET | Freq: Once | ORAL | Status: AC
  Administered 2022-09-20: 1000 mg via ORAL
  Filled 2022-09-20: qty 2

## 2022-09-20 NOTE — ED Triage Notes (Signed)
Pt is here with his son who is translating as pt speaks nepali.  Pt is here for back pain which feels like cramping.  Pt also has a URI with a little cough x2 days.  Pt also reports some cramping in legs.

## 2022-09-20 NOTE — Discharge Instructions (Signed)
?? ????? ??????? ???????? ????? ? ???? ?? ???? ???????? ????? ?? ??????? ?? ????? ? ?? ?????? ???????? ????? ???????? ????? ????? ?????????? ?? ??? ??? ??? ????? ??????  ??????? ??????? ???????   COVID ? ???? ??????? ????????? ???? ??? ????????? ????? ??????? ??????????????? ? ???? ????-?? ??? ??????? ???? ???????? ????? ?? ?????????? (148) ???? ????? ???? ?????? ???? ???????? ????????? ???-?? ????????????  ????, ????? ??????? ???????? ?????????????? ???? ?????? ?????? Tylenol ?????????  ??????? ??????? ????????? 2-3 ????? ??????? ???????? ????????? ????? ?????????? ??? ???????? ?????? ???? ?????? ??????? ???? ??????? ??????? ???????? ?????? ???????? ?????? ???? ???????????  ??? ??????? ????? ????? ???? ??? ????? ???????? ??? ???????? ???????? ??????? ??????????: ???????? ?????, ??? ????? ??????, ?? ???? ???? ??? ?????????  ?????? ???????? ????? ????? ??????????? ???????? ?? ?????? ?????? ???? ?????? ???? ???????  Today you were seen in the emergency department for your muscle cramps and cough.  It is likely that your symptoms are from a viral upper respiratory tract infection that will improve with time.   In the emergency department you had a COVID and flu test that were negative.  Your electrolytes and chest x-ray were also reassuring.  You were found to have mildly low platelets (148) which she will need to follow-up with a primary doctor about.  At home, please take Tylenol as needed for your muscle cramping.    Follow-up with your primary doctor in 2-3 days regarding your visit.  You may use the droppage primary care office to establish care if you need to.  Return immediately to the emergency department if you experience any of the following: Worsening muscle cramps, difficulty breathing, or any other concerning symptoms.    Thank you for visiting our Emergency Department. It was a pleasure taking care of you today.

## 2022-09-20 NOTE — ED Provider Notes (Signed)
[facility] EMERGENCY DEPT Provider Note   CSN: [id number] Arrival date & time: 09/20/22  6546     History  Chief Complaint  Patient presents with   Back Pain    Jorge [name] is a 71 y.o. male.  Patient is [place] speaking and history is obtained via son and Optometrist.  71 year old male with a history of hypertension, GERD and arthritis who presents to the emergency department with 3 days of myalgias, muscle cramps, and dry cough.  Also has had mild shortness of breath.  Denies any chest pain.  No fevers but states that he has had diffuse myalgias worse in his legs and back and muscle cramping that worsened so he came to the emergency department for evaluation.  Also says that he has had a sore throat but denies any congestion or rhinorrhea.  Denies any nausea, vomiting, or diarrhea.  No known sick contacts or recent travel.       Home Medications Prior to Admission medications   Medication Sig Start Date End Date Taking? Authorizing Provider  aluminum-magnesium hydroxide-simethicone (MAALOX) [phone] MG/5ML SUSP Take 15 mLs by mouth 3 (three) times daily as needed (indigestion).    [provider]  cephALEXin (KEFLEX) 500 MG capsule Take 1 capsule (500 mg total) by mouth 3 (three) times daily. 04/01/22   Veryl Speak, MD  cyclobenzaprine (FLEXERIL) 10 MG tablet Take 1 tablet (10 mg total) by mouth 2 (two) times daily as needed for muscle spasms. 03/01/22   Gareth Morgan, MD  famotidine (PEPCID) 40 MG tablet Take 1 tablet (40 mg total) by mouth at bedtime. 01/18/22 02/17/22  Regan Lemming, MD  HYDROcodone-acetaminophen (NORCO) 5-325 MG tablet Take 0.5 tablets by mouth every 4 (four) hours as needed (for pain; may cause constipation). 07/19/21   Molpus, John, MD  hyoscyamine (LEVSIN) 0.125 MG tablet Take 0.125 mg by mouth every 4 (four) hours as needed for cramping. 03/10/21   [provider]  KLOR-CON M20 20 MEQ tablet Take 20 mEq by mouth daily.  02/14/21   [provider]  levofloxacin (LEVAQUIN) 500 MG tablet Take 1 tablet daily starting 07/20/2021. 07/19/21   Molpus, Jenny Reichmann, MD  lisinopril (ZESTRIL) 10 MG tablet Take 10 mg by mouth daily. 12/31/20   [provider]  omeprazole (PRILOSEC) 40 MG capsule Take 40 mg by mouth daily. 03/24/21   [provider]  ondansetron (ZOFRAN-ODT) 4 MG disintegrating tablet Take 4 mg by mouth every 8 (eight) hours as needed for nausea or vomiting. 03/10/21   [provider]  pantoprazole (PROTONIX) 40 MG tablet Take 1 tablet (40 mg total) by mouth daily. 01/18/22 02/17/22  Regan Lemming, MD  prochlorperazine (COMPAZINE) 10 MG tablet Take 1 tablet (10 mg total) by mouth 2 (two) times daily as needed for nausea or vomiting (headache). 03/01/22   Gareth Morgan, MD  sucralfate (CARAFATE) 1 GM/10ML suspension Take 1 mL by mouth 4 (four) times daily -  before meals and at bedtime. 03/04/21   [provider]  Vitamin D, Ergocalciferol, (DRISDOL) 1.25 MG (50000 UNIT) CAPS capsule Take 50,000 Units by mouth every Sunday. 10/24/20   [provider]      Allergies    Ivp dye [iodinated contrast media]    Review of Systems   Review of Systems  Physical Exam Updated Vital Signs BP (!) 140/76   Pulse (!) 53   Temp 97.7 F (36.5 C) (Oral)   Resp 16   SpO2 98%  Physical Exam Vitals and  nursing note reviewed.  Constitutional:      General: He is not in acute distress.    Appearance: He is well-developed.  HENT:     Head: Normocephalic and atraumatic.     Right Ear: External ear normal.     Left Ear: External ear normal.     Nose: Nose normal.  Eyes:     Extraocular Movements: Extraocular movements intact.     Conjunctiva/sclera: Conjunctivae normal.     Pupils: Pupils are equal, round, and reactive to light.  Cardiovascular:     Rate and Rhythm: Normal rate and regular rhythm.     Heart sounds: Normal heart sounds.  Pulmonary:     Effort: Pulmonary effort  is normal. No respiratory distress.     Breath sounds: Normal breath sounds.  Abdominal:     General: There is no distension.     Palpations: Abdomen is soft. There is no mass.     Tenderness: There is no abdominal tenderness. There is no guarding.  Musculoskeletal:        General: No swelling.     Cervical back: Normal range of motion and neck supple.     Right lower leg: No edema.     Left lower leg: No edema.  Skin:    General: Skin is warm and dry.     Capillary Refill: Capillary refill takes less than 2 seconds.  Neurological:     Mental Status: He is alert. Mental status is at baseline.  Psychiatric:        Mood and Affect: Mood normal.        Behavior: Behavior normal.     ED Results / Procedures / Treatments   Labs (all labs ordered are listed, but only abnormal results are displayed) Labs Reviewed  CBC - Abnormal; Notable for the following components:      Result Value   WBC 3.5 (*)    Platelets 148 (*)    All other components within normal limits  BASIC METABOLIC PANEL - Abnormal; Notable for the following components:   Glucose, Bld 105 (*)    All other components within normal limits  RESP PANEL BY RT-PCR (FLU A&B, COVID) ARPGX2  MAGNESIUM  CK    EKG None  Radiology DG Chest 2 View  Result Date: 09/20/2022 CLINICAL DATA:  71 year old male history of cough for the past 2 days. EXAM: CHEST - 2 VIEW COMPARISON:  Chest x-ray 03/01/2022. FINDINGS: Lung volumes are normal. No consolidative airspace disease. No pleural effusions. No pneumothorax. No pulmonary nodule or mass noted. Pulmonary vasculature and the cardiomediastinal silhouette are within normal limits. IMPRESSION: No radiographic evidence of acute cardiopulmonary disease. Electronically Signed   By: Vinnie Langton M.D.   On: 09/20/2022 08:19    Procedures Procedures   Medications Ordered in ED Medications  acetaminophen (TYLENOL) tablet 1,000 mg (1,000 mg Oral Given 09/20/22 0801)    ED Course/  Medical Decision Making/ A&P                           Medical Decision Making Amount and/or Complexity of Data Reviewed Labs: ordered.  Risk OTC drugs.   Jorge [name] is a 71 y.o. male with history of GERD, hypertension, and arthritis who presents with chief complaint of cough, mild shortness of breath, and myalgias.  Initial Ddx:  Viral URI, pneumonia, electrolyte abnormality  MDM:  Feel the patient is likely suffering from a viral URI.  He is satting well on room air and not in any respiratory distress.  Also considered serial pneumonia but feel that this is less likely given how the patient appears at this time.  With his significant muscle cramping could have electrolyte abnormality from decreased p.o. intake.  Will obtain lab work and COVID testing along with a chest x-ray to evaluate patient's symptoms.  ED Summary:  Patient's lab work did not reveal any electrolyte abnormalities that were concerning.  Did have mild thrombocytopenia which we will have the patient follow-up with his primary doctor about.  COVID and flu were negative and chest x-ray did not show evidence of pneumonia.  We will have the patient treat his symptoms with Tylenol at home and follow-up with his primary doctor in several days.  Dispo: DC Home. Return precautions discussed including, but not limited to, those listed in the AVS. Allowed pt time to ask questions which were answered fully prior to dc.   Additional history obtained from son Records reviewed Care Everywhere The following labs were independently interpreted: Chemistry I independently visualized the following imaging with scope of interpretation limited to determining acute life threatening conditions related to emergency care: Chest x-ray, which revealed no acute abnormality   Final Clinical Impression(s) / ED Diagnoses Final diagnoses:  Upper respiratory tract infection, unspecified type  Muscle cramps  Thrombocytopenia ([place])    Rx /  DC Orders ED Discharge Orders     None         Fransico Meadow, MD 09/20/22 [phone]

## 2022-10-18 ENCOUNTER — Emergency Department (HOSPITAL_BASED_OUTPATIENT_CLINIC_OR_DEPARTMENT_OTHER): Payer: Medicare Other

## 2022-10-18 ENCOUNTER — Encounter (HOSPITAL_BASED_OUTPATIENT_CLINIC_OR_DEPARTMENT_OTHER): Payer: Self-pay | Admitting: *Deleted

## 2022-10-18 ENCOUNTER — Emergency Department (HOSPITAL_BASED_OUTPATIENT_CLINIC_OR_DEPARTMENT_OTHER)
Admission: EM | Admit: 2022-10-18 | Discharge: 2022-10-18 | Disposition: A | Discharge status: Home / Self Care | Payer: Medicare Other | Attending: Emergency Medicine | Admitting: Emergency Medicine

## 2022-10-18 ENCOUNTER — Other Ambulatory Visit: Payer: Self-pay

## 2022-10-18 DIAGNOSIS — R1912 Hyperactive bowel sounds: Secondary | ICD-10-CM | POA: Insufficient documentation

## 2022-10-18 DIAGNOSIS — I10 Essential (primary) hypertension: Secondary | ICD-10-CM | POA: Insufficient documentation

## 2022-10-18 DIAGNOSIS — R197 Diarrhea, unspecified: Secondary | ICD-10-CM | POA: Diagnosis not present

## 2022-10-18 DIAGNOSIS — Z79899 Other long term (current) drug therapy: Secondary | ICD-10-CM | POA: Insufficient documentation

## 2022-10-18 DIAGNOSIS — R1030 Lower abdominal pain, unspecified: Secondary | ICD-10-CM | POA: Diagnosis not present

## 2022-10-18 DIAGNOSIS — R109 Unspecified abdominal pain: Secondary | ICD-10-CM

## 2022-10-18 LAB — CBC WITH DIFFERENTIAL/PLATELET
Abs Immature Granulocytes: 0.01 10*3/uL (ref 0.00–0.07)
Basophils Absolute: 0.1 10*3/uL (ref 0.0–0.1)
Basophils Relative: 1 %
Eosinophils Absolute: 0.2 10*3/uL (ref 0.0–0.5)
Eosinophils Relative: 4 %
HCT: 42.4 % (ref 39.0–52.0)
Hemoglobin: 14.9 g/dL (ref 13.0–17.0)
Immature Granulocytes: 0 %
Lymphocytes Relative: 47 %
Lymphs Abs: 1.9 10*3/uL (ref 0.7–4.0)
MCH: 30.5 pg (ref 26.0–34.0)
MCHC: 35.1 g/dL (ref 30.0–36.0)
MCV: 86.9 fL (ref 80.0–100.0)
Monocytes Absolute: 0.3 10*3/uL (ref 0.1–1.0)
Monocytes Relative: 9 %
Neutro Abs: 1.6 10*3/uL — ABNORMAL LOW (ref 1.7–7.7)
Neutrophils Relative %: 39 %
Platelets: 165 10*3/uL (ref 150–400)
RBC: 4.88 MIL/uL (ref 4.22–5.81)
RDW: 12.5 % (ref 11.5–15.5)
WBC: 4 10*3/uL (ref 4.0–10.5)
nRBC: 0 % (ref 0.0–0.2)

## 2022-10-18 LAB — URINALYSIS, ROUTINE W REFLEX MICROSCOPIC
Bilirubin Urine: NEGATIVE
Glucose, UA: NEGATIVE mg/dL
Hgb urine dipstick: NEGATIVE
Ketones, ur: NEGATIVE mg/dL
Leukocytes,Ua: NEGATIVE
Nitrite: NEGATIVE
Protein, ur: NEGATIVE mg/dL
Specific Gravity, Urine: <1.005 — ABNORMAL LOW (ref 1.005–1.030)
pH: 6.5 (ref 5.0–8.0)

## 2022-10-18 LAB — COMPREHENSIVE METABOLIC PANEL
ALT: 24 U/L (ref 0–44)
AST: 22 U/L (ref 15–41)
Albumin: 4.1 g/dL (ref 3.5–5.0)
Alkaline Phosphatase: 69 U/L (ref 38–126)
Anion gap: 7 (ref 5–15)
BUN: 7 mg/dL — ABNORMAL LOW (ref 8–23)
CO2: 24 mmol/L (ref 22–32)
Calcium: 9 mg/dL (ref 8.9–10.3)
Chloride: 107 mmol/L (ref 98–111)
Creatinine, Ser: 0.85 mg/dL (ref 0.61–1.24)
GFR, Estimated: >60 mL/min (ref >60)
Glucose, Bld: 111 mg/dL — ABNORMAL HIGH (ref 70–99)
Potassium: 3.8 mmol/L (ref 3.5–5.1)
Sodium: 138 mmol/L (ref 135–145)
Total Bilirubin: 0.7 mg/dL (ref 0.3–1.2)
Total Protein: 7 g/dL (ref 6.5–8.1)

## 2022-10-18 LAB — LIPASE, BLOOD: Lipase: 10 U/L — ABNORMAL LOW (ref 11–51)

## 2022-10-18 MED ORDER — ONDANSETRON 4 MG PO TBDP: 4.0000 mg | ORAL_TABLET | Freq: Three times a day (TID) | ORAL | 0 refills | Status: DC | PRN

## 2022-10-18 MED ORDER — FENTANYL CITRATE PF 50 MCG/ML IJ SOSY
50.0000 ug | PREFILLED_SYRINGE | Freq: Once | INTRAMUSCULAR | Status: AC
  Administered 2022-10-18: 50 ug via INTRAVENOUS
  Filled 2022-10-18: qty 1

## 2022-10-18 MED ORDER — ONDANSETRON HCL 4 MG/2ML IJ SOLN
INTRAMUSCULAR | Status: AC
  Filled 2022-10-18: qty 2

## 2022-10-18 MED ORDER — ONDANSETRON HCL 4 MG/2ML IJ SOLN
4.0000 mg | Freq: Once | INTRAMUSCULAR | Status: AC
  Administered 2022-10-18: 4 mg via INTRAVENOUS

## 2022-10-18 MED ORDER — BARIUM SULFATE 2 % PO SUSP
450.0000 mL | ORAL | Status: AC
  Administered 2022-10-18: 450 mL via ORAL

## 2022-10-18 NOTE — ED Triage Notes (Addendum)
Pt speaks no [place]. Speaks nepali. Information by translator. C/o abd pain for 2 weeks,but worse tonight. Has an upcoming appointment with a new MD. Has not taken anything for pain. Denies urinary symptoms.

## 2022-10-18 NOTE — ED Provider Notes (Signed)
  Physical Exam  BP 131/68   Pulse (!) 49   Temp (!) 97.4 F (36.3 C) (Oral)   Resp 16   Ht '5\' 1"'$  (1.549 m)   Wt 82.6 kg   SpO2 96%   BMI 34.43 kg/m   Physical Exam  Procedures  Procedures  ED Course / MDM    Medical Decision Making Amount and/or Complexity of Data Reviewed Labs: ordered. Radiology: ordered.  Risk Prescription drug management.   Received patient signout.  Abdominal pain.  Pending CT scan.  Lab work reassuring.  CT scan reassuring.  Feeling somewhat better.  Will treat with antiemetics but appears stable for discharge home with outpatient follow-up       Davonna Belling, MD 10/18/22 [phone]

## 2022-10-18 NOTE — ED Provider Notes (Signed)
[facility] EMERGENCY DEPT Provider Note   CSN: [id number] Arrival date & time: 10/18/22  0450     History  Chief Complaint  Patient presents with   Abdominal Pain    Jorge [name] is a 71 y.o. male.  HPI     This is a 71 year old male who presents with abdominal pain.  Family at bedside assist with history.  Reported the last 2 to 3 weeks he has had intermittent crampy lower abdominal pain.  The pain has increased over the last 24 hours.  He has had occasional episodes of diarrhea.  No blood in stool.  Denies dysuria or hematuria.  Has not had any nausea or vomiting.  No fevers.  Home Medications Prior to Admission medications   Medication Sig Start Date End Date Taking? Authorizing Provider  aluminum-magnesium hydroxide-simethicone (MAALOX) [phone] MG/5ML SUSP Take 15 mLs by mouth 3 (three) times daily as needed (indigestion).    [provider]  hyoscyamine (LEVSIN) 0.125 MG tablet Take 0.125 mg by mouth every 4 (four) hours as needed for cramping. 03/10/21   [provider]  KLOR-CON M20 20 MEQ tablet Take 20 mEq by mouth daily. 02/14/21   [provider]  lisinopril (ZESTRIL) 10 MG tablet Take 10 mg by mouth daily. 12/31/20   [provider]  omeprazole (PRILOSEC) 40 MG capsule Take 40 mg by mouth daily. 03/24/21   [provider]  ondansetron (ZOFRAN-ODT) 4 MG disintegrating tablet Take 4 mg by mouth every 8 (eight) hours as needed for nausea or vomiting. 03/10/21   [provider]  Vitamin D, Ergocalciferol, (DRISDOL) 1.25 MG (50000 UNIT) CAPS capsule Take 50,000 Units by mouth every Sunday. 10/24/20   [provider]      Allergies    Ivp dye [iodinated contrast media]    Review of Systems   Review of Systems  Constitutional:  Negative for fever.  Gastrointestinal:  Positive for abdominal pain and diarrhea. Negative for nausea and vomiting.  Genitourinary:  Negative for dysuria.  All other  systems reviewed and are negative.   Physical Exam Updated Vital Signs BP 128/71   Pulse (!) 53   Resp 18   Ht 1.549 m ('5\' 1"'$ )   Wt 82.6 kg   SpO2 97%   BMI 34.43 kg/m  Physical Exam Vitals and nursing note reviewed.  Constitutional:      Appearance: He is well-developed. He is not ill-appearing.  HENT:     Head: Normocephalic and atraumatic.  Eyes:     Pupils: Pupils are equal, round, and reactive to light.  Cardiovascular:     Rate and Rhythm: Normal rate and regular rhythm.     Heart sounds: Normal heart sounds. No murmur heard. Pulmonary:     Effort: Pulmonary effort is normal. No respiratory distress.     Breath sounds: Normal breath sounds. No wheezing.  Abdominal:     General: Bowel sounds are normal.     Palpations: Abdomen is soft.     Tenderness: There is abdominal tenderness in the suprapubic area. There is no guarding or rebound.     Hernia: No hernia is present.     Comments: Hyperactive bowel sounds  Musculoskeletal:     Cervical back: Neck supple.  Lymphadenopathy:     Cervical: No cervical adenopathy.  Skin:    General: Skin is warm and dry.  Neurological:     General: No focal deficit present.     Mental Status: He is alert and  oriented to person, place, and time.  Psychiatric:        Mood and Affect: Mood normal.     ED Results / Procedures / Treatments   Labs (all labs ordered are listed, but only abnormal results are displayed) Labs Reviewed  CBC WITH DIFFERENTIAL/PLATELET - Abnormal; Notable for the following components:      Result Value   Neutro Abs 1.6 (*)    All other components within normal limits  COMPREHENSIVE METABOLIC PANEL - Abnormal; Notable for the following components:   Glucose, Bld 111 (*)    BUN 7 (*)    All other components within normal limits  LIPASE, BLOOD - Abnormal; Notable for the following components:   Lipase 10 (*)    All other components within normal limits  URINALYSIS, ROUTINE W REFLEX MICROSCOPIC -  Abnormal; Notable for the following components:   Color, Urine COLORLESS (*)    Specific Gravity, Urine <1.005 (*)    All other components within normal limits    EKG None  Radiology No results found.  Procedures Procedures    Medications Ordered in ED Medications  barium (READI-CAT 2) 2 % suspension 450 mL (450 mLs Oral Given 10/18/22 0623)  fentaNYL (SUBLIMAZE) injection 50 mcg (50 mcg Intravenous Given 10/18/22 0605)    ED Course/ Medical Decision Making/ A&P                           Medical Decision Making Amount and/or Complexity of Data Reviewed Labs: ordered. Radiology: ordered.  Risk Prescription drug management.   This patient presents to the ED for concern of abdominal pain, this involves an extensive number of treatment options, and is a complaint that carries with it a high risk of complications and morbidity.  I considered the following differential and admission for this acute, potentially life threatening condition.  The differential diagnosis includes gastritis, gastroenteritis, SBO, appendicitis, urinary tract infection  MDM:    This is a 71 year old male who presents with abdominal pain.  Pain is subacute.  It is intermittent in nature.  He is nontoxic.  He has some suprapubic tenderness to palpation.  No signs of peritonitis.  Denies urinary symptoms.  Denies fever or systemic symptoms.  Denies significant obstructive symptoms.  Patient was given pain medication.  Labs obtained.  No significant leukocytosis.  Normal metabolic panel without LFT or lipase derangement.  Given patient age, will obtain CT scan.  CT scan pending.  (Labs, imaging, consults)  Labs: I Ordered, and personally interpreted labs.  The pertinent results include: CBC, CMP, lipase, urinalysis  Imaging Studies ordered: I ordered imaging studies including CT abdomen pelvis pending I independently visualized and interpreted imaging. I agree with the radiologist  interpretation  Additional history obtained from son at bedside.  External records from outside source obtained and reviewed including evaluations  Cardiac Monitoring: The patient was maintained on a cardiac monitor.  I personally viewed and interpreted the cardiac monitored which showed an underlying rhythm of: Sinus rhythm  Reevaluation: After the interventions noted above, I reevaluated the patient and found that they have :stayed the same  Social Determinants of Health: Lives independently  Disposition: Pending  Co morbidities that complicate the patient evaluation  Past Medical History:  Diagnosis Date   Acid reflux    Arthritis    right leg   Cataract    bil removed   Hypertension      Medicines Meds ordered this encounter  Medications   fentaNYL (SUBLIMAZE) injection 50 mcg   barium (READI-CAT 2) 2 % suspension 450 mL    I have reviewed the patients home medicines and have made adjustments as needed  Problem List / ED Course: Problem List Items Addressed This Visit   None               Final Clinical Impression(s) / ED Diagnoses Final diagnoses:  None    Rx / DC Orders ED Discharge Orders     None         Merryl Hacker, MD 10/18/22 [phone]

## 2022-10-18 NOTE — ED Notes (Signed)
Dc instructions reviewed with patient. Patient voiced understanding. Dc with belongings.  °

## 2022-11-07 ENCOUNTER — Other Ambulatory Visit: Payer: Self-pay

## 2022-11-07 DIAGNOSIS — I10 Essential (primary) hypertension: Secondary | ICD-10-CM | POA: Diagnosis not present

## 2022-11-07 DIAGNOSIS — Z79899 Other long term (current) drug therapy: Secondary | ICD-10-CM | POA: Diagnosis not present

## 2022-11-07 DIAGNOSIS — K2971 Gastritis, unspecified, with bleeding: Secondary | ICD-10-CM | POA: Diagnosis not present

## 2022-11-07 DIAGNOSIS — M545 Low back pain, unspecified: Secondary | ICD-10-CM | POA: Insufficient documentation

## 2022-11-07 DIAGNOSIS — R1032 Left lower quadrant pain: Secondary | ICD-10-CM | POA: Diagnosis present

## 2022-11-07 DIAGNOSIS — G8929 Other chronic pain: Secondary | ICD-10-CM | POA: Diagnosis not present

## 2022-11-07 LAB — COMPREHENSIVE METABOLIC PANEL
ALT: 28 U/L (ref 0–44)
AST: 33 U/L (ref 15–41)
Albumin: 4.6 g/dL (ref 3.5–5.0)
Alkaline Phosphatase: 78 U/L (ref 38–126)
Anion gap: 10 (ref 5–15)
BUN: 8 mg/dL (ref 8–23)
CO2: 25 mmol/L (ref 22–32)
Calcium: 9.8 mg/dL (ref 8.9–10.3)
Chloride: 105 mmol/L (ref 98–111)
Creatinine, Ser: 0.87 mg/dL (ref 0.61–1.24)
GFR, Estimated: >60 mL/min (ref >60)
Glucose, Bld: 141 mg/dL — ABNORMAL HIGH (ref 70–99)
Potassium: 4.4 mmol/L (ref 3.5–5.1)
Sodium: 140 mmol/L (ref 135–145)
Total Bilirubin: 0.9 mg/dL (ref 0.3–1.2)
Total Protein: 7.8 g/dL (ref 6.5–8.1)

## 2022-11-07 LAB — CBC
HCT: 41.8 % (ref 39.0–52.0)
Hemoglobin: 14.5 g/dL (ref 13.0–17.0)
MCH: 29.9 pg (ref 26.0–34.0)
MCHC: 34.7 g/dL (ref 30.0–36.0)
MCV: 86.2 fL (ref 80.0–100.0)
Platelets: 131 10*3/uL — ABNORMAL LOW (ref 150–400)
RBC: 4.85 MIL/uL (ref 4.22–5.81)
RDW: 12.5 % (ref 11.5–15.5)
WBC: 4.7 10*3/uL (ref 4.0–10.5)
nRBC: 0 % (ref 0.0–0.2)

## 2022-11-07 LAB — LIPASE, BLOOD: Lipase: 10 U/L — ABNORMAL LOW (ref 11–51)

## 2022-11-07 NOTE — ED Triage Notes (Signed)
PT BIB son d/t concerns for groin pain that radiates to left flank pain and down left leg that started 2 weeks, worsening yesterday. Along with right upper abdominal pain. Endorses decrease urination, denies any additional urinary problems. Pain worsens with movement. No NVD. No penial discharge.   Pt requires Scientist, research (physical sciences).

## 2022-11-08 ENCOUNTER — Encounter (HOSPITAL_BASED_OUTPATIENT_CLINIC_OR_DEPARTMENT_OTHER): Payer: Self-pay | Admitting: Radiology

## 2022-11-08 ENCOUNTER — Emergency Department (HOSPITAL_BASED_OUTPATIENT_CLINIC_OR_DEPARTMENT_OTHER)
Admission: EM | Admit: 2022-11-08 | Discharge: 2022-11-08 | Disposition: A | Discharge status: Home / Self Care | Payer: Medicare Other | Attending: Emergency Medicine | Admitting: Emergency Medicine

## 2022-11-08 ENCOUNTER — Emergency Department (HOSPITAL_BASED_OUTPATIENT_CLINIC_OR_DEPARTMENT_OTHER): Payer: Medicare Other

## 2022-11-08 DIAGNOSIS — K2970 Gastritis, unspecified, without bleeding: Secondary | ICD-10-CM

## 2022-11-08 DIAGNOSIS — G8929 Other chronic pain: Secondary | ICD-10-CM

## 2022-11-08 DIAGNOSIS — R109 Unspecified abdominal pain: Secondary | ICD-10-CM

## 2022-11-08 LAB — URINALYSIS, ROUTINE W REFLEX MICROSCOPIC
Bilirubin Urine: NEGATIVE
Glucose, UA: NEGATIVE mg/dL
Hgb urine dipstick: NEGATIVE
Ketones, ur: NEGATIVE mg/dL
Leukocytes,Ua: NEGATIVE
Nitrite: NEGATIVE
Protein, ur: NEGATIVE mg/dL
Specific Gravity, Urine: <1.005 — ABNORMAL LOW (ref 1.005–1.030)
pH: 7.5 (ref 5.0–8.0)

## 2022-11-08 MED ORDER — SUCRALFATE 1 G PO TABS: 1.0000 g | ORAL_TABLET | Freq: Three times a day (TID) | ORAL | 0 refills | Status: DC

## 2022-11-08 MED ORDER — FAMOTIDINE IN NACL 20-0.9 MG/50ML-% IV SOLN
20.0000 mg | Freq: Once | INTRAVENOUS | Status: AC
  Administered 2022-11-08: 20 mg via INTRAVENOUS
  Filled 2022-11-08: qty 50

## 2022-11-08 MED ORDER — IOHEXOL 300 MG/ML  SOLN
100.0000 mL | Freq: Once | INTRAMUSCULAR | Status: AC | PRN
  Administered 2022-11-08: 80 mL via INTRAVENOUS

## 2022-11-08 MED ORDER — LIDOCAINE VISCOUS HCL 2 % MT SOLN
15.0000 mL | Freq: Once | OROMUCOSAL | Status: AC
  Administered 2022-11-08: 15 mL via ORAL
  Filled 2022-11-08: qty 15

## 2022-11-08 MED ORDER — SODIUM CHLORIDE 0.9 % IV BOLUS
1000.0000 mL | Freq: Once | INTRAVENOUS | Status: AC
  Administered 2022-11-08: 1000 mL via INTRAVENOUS

## 2022-11-08 MED ORDER — ALUM & MAG HYDROXIDE-SIMETH 200-200-20 MG/5ML PO SUSP
30.0000 mL | Freq: Once | ORAL | Status: AC
  Administered 2022-11-08: 30 mL via ORAL
  Filled 2022-11-08: qty 30

## 2022-11-08 MED ORDER — ACETAMINOPHEN-CODEINE 300-30 MG PO TABS: 1.0000 | ORAL_TABLET | Freq: Four times a day (QID) | ORAL | 0 refills | Status: DC | PRN

## 2022-11-08 MED ORDER — ONDANSETRON HCL 4 MG/2ML IJ SOLN
4.0000 mg | Freq: Once | INTRAMUSCULAR | Status: AC
  Administered 2022-11-08: 4 mg via INTRAVENOUS
  Filled 2022-11-08: qty 2

## 2022-11-08 MED ORDER — KETOROLAC TROMETHAMINE 15 MG/ML IJ SOLN
15.0000 mg | Freq: Once | INTRAMUSCULAR | Status: AC
  Administered 2022-11-08: 15 mg via INTRAVENOUS
  Filled 2022-11-08: qty 1

## 2022-11-08 MED ORDER — DICYCLOMINE HCL 20 MG PO TABS: 10.0000 mg | ORAL_TABLET | Freq: Two times a day (BID) | ORAL | 0 refills | Status: DC

## 2022-11-08 NOTE — ED Notes (Signed)
Patient transported to CT 

## 2022-11-08 NOTE — ED Provider Notes (Signed)
[facility]ER[facility] DEPT Provider Note   CSN: [id number] Arrival date & time: 11/07/22  2011     History  Chief Complaint  Patient presents with   Flank Pain   Groin Pain    Jorge [name] is a 71 y.o. male.  Patient as above with significant medical history as below, including acid reflux, htn, lumbar laminectomy 2017 Dr Cyndy Freeze who presents to the ED with complaint of LLQ pain, left groin pain, lumbar pain. Back pain over last week lumbar, llq pain over last 4-5 days, worse in last 24 hours. Back pain feels like his chronic back pain a/w prior surgery, no numbness or tingling, no gait change no trauma, no saddle paresthesia or changes to urination. Gait wnl per pt. LLQ pain worse last 24 hours, radiating to his left testicle/left groin area. No change to urination, no n/v, no change to bowel activity, no trauma. Urination WNL. He had this in the past but it resolved spontaneously. No medications PTA. No fevers or chills      Past Medical History:  Diagnosis Date   Acid reflux    Arthritis    right leg   Cataract    bil removed   Hypertension     Past Surgical History:  Procedure Laterality Date   cataracts Bilateral    LUMBAR LAMINECTOMY/DECOMPRESSION MICRODISCECTOMY Right 12/25/2016   Procedure: Lumbar three-five Laminectomy, Right Lumbar three-four, Lumbar four-five Diskectomy ;  Surgeon: Kevan Ny Ditty, MD;  Location: [facility];  Service: Neurosurgery;  Laterality: Right;     The history is provided by the patient and a relative. The history is limited by a language barrier. No language interpreter was used (prefers to have family translate, interpreter offered and avail if pt desires).  Flank Pain Associated symptoms include abdominal pain. Pertinent negatives include no chest pain, no headaches and no shortness of breath.  Groin Pain Associated symptoms include abdominal pain. Pertinent negatives include no chest pain, no headaches and no shortness  of breath.       Home Medications Prior to Admission medications   Medication Sig Start Date End Date Taking? Authorizing Provider  acetaminophen-codeine (TYLENOL #3) 300-30 MG tablet Take 1-2 tablets by mouth every 6 (six) hours as needed for moderate pain. 11/08/22  Yes Wynona Dove A, DO  dicyclomine (BENTYL) 20 MG tablet Take 0.5 tablets (10 mg total) by mouth 2 (two) times daily for 7 days. 11/08/22 11/15/22 Yes Wynona Dove A, DO  sucralfate (CARAFATE) 1 g tablet Take 1 tablet (1 g total) by mouth with breakfast, with lunch, and with evening meal for 14 days. 11/08/22 11/22/22 Yes Wynona Dove A, DO  aluminum-magnesium hydroxide-simethicone (MAALOX) 200-200-20 MG/5ML SUSP Take 15 mLs by mouth 3 (three) times daily as needed (indigestion).    [provider]  hyoscyamine (LEVSIN) 0.125 MG tablet Take 0.125 mg by mouth every 4 (four) hours as needed for cramping. 03/10/21   [provider]  KLOR-CON M20 20 MEQ tablet Take 20 mEq by mouth daily. 02/14/21   [provider]  lisinopril (ZESTRIL) 10 MG tablet Take 10 mg by mouth daily. 12/31/20   [provider]  omeprazole (PRILOSEC) 40 MG capsule Take 40 mg by mouth daily. 03/24/21   [provider]  ondansetron (ZOFRAN-ODT) 4 MG disintegrating tablet Take 1 tablet (4 mg total) by mouth every 8 (eight) hours as needed for nausea or vomiting. 10/18/22   Davonna Belling, MD  Vitamin D, Ergocalciferol, (DRISDOL) 1.25 MG (50000 UNIT) CAPS  capsule Take 50,000 Units by mouth every Sunday. 10/24/20   [provider]      Allergies    Ivp dye [iodinated contrast media]    Review of Systems   Review of Systems  Constitutional:  Negative for chills and fever.  HENT:  Negative for facial swelling and trouble swallowing.   Eyes:  Negative for photophobia and visual disturbance.  Respiratory:  Negative for cough and shortness of breath.   Cardiovascular:  Negative for chest pain and palpitations.   Gastrointestinal:  Positive for abdominal pain. Negative for nausea and vomiting.  Endocrine: Negative for polydipsia and polyuria.  Genitourinary:  Positive for flank pain. Negative for difficulty urinating and hematuria.       Left groin pain  Musculoskeletal:  Negative for gait problem and joint swelling.  Skin:  Negative for pallor and rash.  Neurological:  Negative for syncope and headaches.  Psychiatric/Behavioral:  Negative for agitation and confusion.     Physical Exam Updated Vital Signs BP 126/75   Pulse (!) 51   Temp 98.1 F (36.7 C) (Oral)   Resp 20   SpO2 98%  Physical Exam Vitals and nursing note reviewed. Exam conducted with a chaperone present.  Constitutional:      General: He is not in acute distress.    Appearance: Normal appearance. He is well-developed. He is not ill-appearing, toxic-appearing or diaphoretic.  HENT:     Head: Normocephalic and atraumatic.     Right Ear: External ear normal.     Left Ear: External ear normal.     Mouth/Throat:     Mouth: Mucous membranes are moist.  Eyes:     General: No scleral icterus.    Extraocular Movements: Extraocular movements intact.     Pupils: Pupils are equal, round, and reactive to light.  Cardiovascular:     Rate and Rhythm: Normal rate and regular rhythm.     Pulses: Normal pulses.     Heart sounds: Normal heart sounds.  Pulmonary:     Effort: Pulmonary effort is normal. No respiratory distress.     Breath sounds: Normal breath sounds.  Abdominal:     General: Abdomen is flat.     Palpations: Abdomen is soft.     Tenderness: There is abdominal tenderness.    Genitourinary:    Testes: Normal. Cremasteric reflex is present.       Comments: No large hernia  Mild ttp to left inguinal canal Penis/testicles/scrotum wnl on exam Musculoskeletal:        General: Normal range of motion.     Cervical back: Full passive range of motion without pain and normal range of motion.       Back:     Right  lower leg: No edema.     Left lower leg: No edema.  Skin:    General: Skin is warm and dry.     Capillary Refill: Capillary refill takes less than 2 seconds.  Neurological:     Mental Status: He is alert and oriented to person, place, and time.     GCS: GCS eye subscore is 4. GCS verbal subscore is 5. GCS motor subscore is 6.     Cranial Nerves: Cranial nerves 2-12 are intact. No dysarthria.     Sensory: Sensation is intact.     Motor: Motor function is intact. No weakness.     Coordination: Coordination is intact.     Gait: Gait is intact.     Comments: Strength 5/5  BLUE BLLE No pain a/w log roll BLLE  Psychiatric:        Mood and Affect: Mood normal.        Behavior: Behavior normal.     ED Results / Procedures / Treatments   Labs (all labs ordered are listed, but only abnormal results are displayed) Labs Reviewed  URINALYSIS, ROUTINE W REFLEX MICROSCOPIC - Abnormal; Notable for the following components:      Result Value   Color, Urine COLORLESS (*)    Specific Gravity, Urine <1.005 (*)    All other components within normal limits  LIPASE, BLOOD - Abnormal; Notable for the following components:   Lipase 10 (*)    All other components within normal limits  COMPREHENSIVE METABOLIC PANEL - Abnormal; Notable for the following components:   Glucose, Bld 141 (*)    All other components within normal limits  CBC - Abnormal; Notable for the following components:   Platelets 131 (*)    All other components within normal limits    EKG None  Radiology CT ABDOMEN PELVIS W CONTRAST  Result Date: 11/08/2022 CLINICAL DATA:  Left lower quadrant abdominal pain, left flank pain EXAM: CT ABDOMEN AND PELVIS WITH CONTRAST TECHNIQUE: Multidetector CT imaging of the abdomen and pelvis was performed using the standard protocol following bolus administration of intravenous contrast. RADIATION DOSE REDUCTION: This exam was performed according to the departmental dose-optimization program  which includes automated exposure control, adjustment of the mA and/or kV according to patient size and/or use of iterative reconstruction technique. CONTRAST:  48m OMNIPAQUE IOHEXOL 300 MG/ML  SOLN COMPARISON:  10/18/2022 FINDINGS: Lower chest: No acute abnormality. Hepatobiliary: Tiny cyst within the left hepatic lobe. Liver otherwise unremarkable. Gallbladder unremarkable. No intra or extrahepatic biliary ductal dilation. Pancreas: Unremarkable Spleen: Unremarkable Adrenals/Urinary Tract: The adrenal glands are unremarkable. The kidneys are normal in size and position. Tiny hypodensity within the right inferior pole is too small to accurately characterize may represent a tiny cyst. No follow-up imaging is recommended. The kidneys are otherwise unremarkable. The bladder is unremarkable. Stomach/Bowel: Stomach is within normal limits. Appendix appears normal. No evidence of bowel wall thickening, distention, or inflammatory changes. Vascular/Lymphatic: Aortic atherosclerosis. No enlarged abdominal or pelvic lymph nodes. Reproductive: Moderate prostatic enlargement Other: No abdominal wall hernia or abnormality. No abdominopelvic ascites. Musculoskeletal: Posterior decompression L4. Degenerative changes are seen within the lumbar spine no acute bone abnormality. No lytic or blastic bone lesion. IMPRESSION: 1. No acute intra-abdominal pathology identified. No definite radiographic explanation for the patient's reported symptoms. 2. Moderate prostatic enlargement. 3. Aortic atherosclerosis. Aortic Atherosclerosis (ICD10-I70.0). Electronically Signed   By: AFidela SalisburyM.D.   On: 11/08/2022 03:37   CT L-SPINE NO CHARGE  Result Date: 11/08/2022 CLINICAL DATA:  Left groin and flank pain EXAM: CT LUMBAR SPINE WITHOUT CONTRAST TECHNIQUE: Multidetector CT imaging of the lumbar spine was performed without intravenous contrast administration. Multiplanar CT image reconstructions were also generated. RADIATION DOSE  REDUCTION: This exam was performed according to the departmental dose-optimization program which includes automated exposure control, adjustment of the mA and/or kV according to patient size and/or use of iterative reconstruction technique. COMPARISON:  None Available. FINDINGS: Segmentation: 5 lumbar type vertebrae. Alignment: Grade 1 anterolisthesis at L4-5 Vertebrae: Posterior decompression at L4-5 and right L5 laminectomy. No fracture or other acute finding. Disc levels: There is multilevel degenerative disc disease. There is moderate spinal canal stenosis and bilateral neural foraminal stenosis at L3-4. There is moderate bilateral foraminal stenosis at L4-5. IMPRESSION: 1.  No acute fracture or static subluxation of the lumbar spine. 2. Moderate spinal canal stenosis and bilateral neural foraminal stenosis at L3-4. 3. Moderate bilateral foraminal stenosis at L4-5. Electronically Signed   By: Ulyses Jarred M.D.   On: 11/08/2022 03:34    Procedures Procedures    Medications Ordered in ED Medications  ondansetron (ZOFRAN) injection 4 mg (4 mg Intravenous Given 11/08/22 0239)  sodium chloride 0.9 % bolus 1,000 mL (0 mLs Intravenous Stopped 11/08/22 0330)  ketorolac (TORADOL) 15 MG/ML injection 15 mg (15 mg Intravenous Given 11/08/22 0239)  iohexol (OMNIPAQUE) 300 MG/ML solution 100 mL (80 mLs Intravenous Contrast Given 11/08/22 0312)  famotidine (PEPCID) IVPB 20 mg premix (0 mg Intravenous Stopped 11/08/22 0725)  alum & mag hydroxide-simeth (MAALOX/MYLANTA) 200-200-20 MG/5ML suspension 30 mL (30 mLs Oral Given 11/08/22 0651)    And  lidocaine (XYLOCAINE) 2 % viscous mouth solution 15 mL (15 mLs Oral Given 11/08/22 5400)    ED Course/ Medical Decision Making/ A&P                           Medical Decision Making Amount and/or Complexity of Data Reviewed Labs: ordered. Radiology: ordered.  Risk OTC drugs. Prescription drug management.   This patient presents to the ED with chief  complaint(s) of left groin/abd pain, low back pain with pertinent past medical history of prior lumbar surg which further complicates the presenting complaint. The complaint involves an extensive differential diagnosis and also carries with it a high risk of complications and morbidity.     Differential diagnosis includes but is not exclusive to musculoskeletal back pain, renal colic, urinary tract infection, pyelonephritis, intra-abdominal causes of back pain, aortic aneurysm or dissection, cauda equina syndrome, sciatica, lumbar disc disease, thoracic disc disease, etc.  Differential diagnosis includes but is not exclusive to acute appendicitis, renal colic, testicular torsion, urinary tract infection, prostatitis,  diverticulitis, small bowel obstruction, colitis, abdominal aortic aneurysm, gastroenteritis, constipation etc.  . Serious etiologies were considered.   The initial plan is to screening labs/ ct ap/lumbar, analgesia   Additional history obtained: Additional history obtained from family Records reviewed  prior ed visits, prior labs/imagimg Seen at [facility] ED 10/22 w/ similar complaint, labs stable, CT a/p was WNL  Independent labs interpretation:  The following labs were independently interpreted:  Cmp wnl Lipase not elev Ua w/o infection CBC w/ plt 131, no bruising or bleeding reported; fairly similar to his baseline   Independent visualization of imaging: - I independently visualized the following imaging with scope of interpretation limited to determining acute life threatening conditions related to emergency care: CTAP, which revealed no acute process  Cardiac monitoring was reviewed and interpreted by myself which shows sinus brady  Treatment and Reassessment: Ivf Toradol Zofran >> improved  Consultation: - Consulted or discussed management/test interpretation w/ external professional: na  Consideration for admission or further workup: Admission was considered    Patient presents with low back pain without signs of spinal cord compression, cauda equina syndrome, infection, aneurysm, or other serious etiology. The patient is neurologically intact. Given the extremely low risk of these diagnoses further testing and evaluation for these possibilities does not appear to be indicated at this time. Detailed discussions were had with the patient and/or family and caregivers, regarding current findings, and need for close f/u with PCP or on call doctor. The patient has been instructed to return immediately if the symptoms worsen in any way. Patient verbalized understanding and  is in agreement with current care plan.   The patient's overall condition has improved, the patient presents with abdominal pain without signs of peritonitis, or other life-threatening serious etiology. The patient understands that at this time there is no evidence for a more malignant underlying process, but the patient also understands that early in the process of an illness, an emergency department workup can be falsely reassuring. Detailed discussions were had with the patient regarding current findings, and need for close f/u with PCP or on call doctor. The patient appears stable for discharge and has been instructed to return immediately if the symptoms worsen in any way, or in 12-24hr if not improved for re-evaluation. Patient verbalized understanding and is in agreement with current care plan.  All questions answered prior to discharge.   Referral to gi placed    Social Determinants of health: No pcp Non native english speaker  Social History   Tobacco Use   Smoking status: Former    Packs/day: 0.25    Years: 40.00    Total pack years: 10.00    Types: Cigarettes    Quit date: 12/17/2011    Years since quitting: 10.9   Smokeless tobacco: Never  Vaping Use   Vaping Use: Never used  Substance Use Topics   Alcohol use: No   Drug use: No            Final Clinical  Impression(s) / ED Diagnoses Final diagnoses:  Abdominal pain, unspecified abdominal location  Gastritis, presence of bleeding unspecified, unspecified chronicity, unspecified gastritis type  Chronic low back pain without sciatica, unspecified back pain laterality    Rx / DC Orders ED Discharge Orders          Ordered    Ambulatory referral to Gastroenterology        11/08/22 0729    sucralfate (CARAFATE) 1 g tablet  3 times daily with meals        11/08/22 0730    dicyclomine (BENTYL) 20 MG tablet  2 times daily        11/08/22 0730    acetaminophen-codeine (TYLENOL #3) 300-30 MG tablet  Every 6 hours PRN        11/08/22 [place], Jama Mcmiller A, DO 11/08/22 [phone]

## 2022-11-08 NOTE — Discharge Instructions (Addendum)
The etiology of your abdominal pain is unclear.    ]There are many causes of abdominal pain. Most pain is not serious and goes away, but some pain gets worse, changes, or will not go away. Please return to the emergency department or see your doctor right away if you (or your family member) experience any of the following:  1. Pain that gets worse or moves to just one spot.  2. Pain that gets worse if you cough or sneeze.  3. Pain with going over a bump in the road.  4. Pain that does not get better in 24 hours.  5. Inability to keep down liquids (vomiting)-especially if you are making less urine.  6. Fainting.  7. Blood in the vomit or stool.  8. High fever or shaking chills.  9. Swelling of the abdomen.  10. Any new or worsening problem.      Follow-up Instructions  See your primary care provider if not better in the next 2-3 days. Come to the ED if you are unable to see them in this time frame.    Additional Instructions  No alcohol.  No caffeine, aspirin, or cigarettes.   Please return to the emergency department immediately for any new or concerning symptoms, or if you get worse.        ??????? ??? ??????? ????????? ??????? ??   ??? ?????? ???? ??????? ???? ??????? ????? ?????? ?????? ? ?????, ?? ???? ????? ??? ???? ?????, ???????? ?????, ?? ??????? ????? ??????? ??????? ?????????? ?? ??? ????? (?? ??????? ???????? ?????) ????? ????? ???? ??? ????? ?????????? ??? ???????? ??????? ????????? ??????????: 1. ????? ??? ???? ????? ?? ???? ?? ?????? ????? 2. ?????? ?? ???????? ????? ????? ??? ????? 3. ?????? ?? ????? ?? ????? ?????? 4. ????? ??? 24 ??????? ???? ??????? 5. ??? ????????? ????? ?????? (??????) - ????? ??? ??? ????? ?? ????? ????? ????????? ???? 6. ????? ????? 7. ?????? ?? ?????? ???? 8. ???? ????? ?? ???? ????? 9. ??? ????????? 10. ???? ??? ???? ?? ???????? ???? ???????     ?????? ??????????? ????? ?-? ????? ?????? ??? ????? ???????? ?????? ??????????  ??????????? ED ?? ???????? ??? ????? ?????????? ?? ??? ??????? ????? ?????? ??????????   ???????? ??????????? ????? ???? ???? ???????, ????????, ?? ???????   ????? ???? ??? ???? ?? ????????? ?????????? ????, ?? ??? ????? ???? ??????? ??? ???????? ???????? ??????? ???????????

## 2022-11-08 NOTE — ED Notes (Signed)
Pt ambulated to the bathroom without assistance. 

## 2022-11-12 ENCOUNTER — Ambulatory Visit (INDEPENDENT_AMBULATORY_CARE_PROVIDER_SITE_OTHER): Payer: Medicare Other | Admitting: Family Medicine

## 2022-11-12 ENCOUNTER — Other Ambulatory Visit (HOSPITAL_BASED_OUTPATIENT_CLINIC_OR_DEPARTMENT_OTHER): Payer: Self-pay

## 2022-11-12 ENCOUNTER — Encounter (HOSPITAL_BASED_OUTPATIENT_CLINIC_OR_DEPARTMENT_OTHER): Payer: Self-pay | Admitting: Family Medicine

## 2022-11-12 DIAGNOSIS — R109 Unspecified abdominal pain: Secondary | ICD-10-CM | POA: Diagnosis not present

## 2022-11-12 DIAGNOSIS — N329 Bladder disorder, unspecified: Secondary | ICD-10-CM

## 2022-11-12 DIAGNOSIS — G8929 Other chronic pain: Secondary | ICD-10-CM | POA: Diagnosis not present

## 2022-11-12 DIAGNOSIS — I10 Essential (primary) hypertension: Secondary | ICD-10-CM | POA: Insufficient documentation

## 2022-11-12 MED ORDER — OMEPRAZOLE 40 MG PO CPDR
40.0000 mg | DELAYED_RELEASE_CAPSULE | Freq: Every day | ORAL | 0 refills | Status: DC
  Filled 2022-11-12: qty 90, 90d supply, fill #0

## 2022-11-12 MED ORDER — TAMSULOSIN HCL 0.4 MG PO CAPS
0.4000 mg | ORAL_CAPSULE | Freq: Every day | ORAL | 2 refills | Status: DC
  Filled 2022-11-12: qty 30, 30d supply, fill #0
  Filled 2022-12-07: qty 30, 30d supply, fill #1
  Filled 2023-01-02 – 2023-01-08 (×5): qty 30, 30d supply, fill #2

## 2022-11-12 MED ORDER — LISINOPRIL 10 MG PO TABS
10.0000 mg | ORAL_TABLET | Freq: Every day | ORAL | 1 refills | Status: DC
  Filled 2022-11-12: qty 90, 90d supply, fill #0
  Filled 2023-02-05 (×2): qty 90, 90d supply, fill #1

## 2022-11-12 MED ORDER — GABAPENTIN 300 MG PO CAPS
300.0000 mg | ORAL_CAPSULE | Freq: Three times a day (TID) | ORAL | 1 refills | Status: DC
  Filled 2022-11-12: qty 90, 30d supply, fill #0
  Filled 2022-12-07: qty 90, 30d supply, fill #1

## 2022-11-12 NOTE — Progress Notes (Signed)
New Patient Office Visit  Subjective    Patient ID: Jorge [name], male    DOB: 07-Feb-1951  Age: 71 y.o. MRN: [id number]  CC:  Chief Complaint  Patient presents with   New Patient (Initial Visit)    Pt here to establish new care, pt need refills on all his medications     HPI Jorge [name] presents to establish care Last PCP - was in [place] Patient is accompanied by son who acts as interpreter  Urology: Was following with urologist previously, primarily related to lesion of bladder for which he had cystoscopy a little over 1 year ago.  Review of office records indicates that the plan was for 1 year follow-up with urologist, however patient has been to the area locally and is needing referral to establish with urologist here.  Additionally from note, it does appear that plan was to recheck PSA at that time as well  Chronic abdominal pain: Patient is on report intermittent issues with abdominal pain which is going on for some time.  He has had recent visits to emergency department related to these intermittent issues related to abdominal pain.  At most recent emergency department visit, patient was prescribed a short course of Bentyl to help with symptoms which he indicates did provide some improvement.  Given chronic, intermittent issues, patient and son are requesting referral to gastroenterology to evaluate further.  HLD: not taking atorvastatin, reports last doctor said he no longer needed to take this, reports this was after labs were checked  Gabapentin on medication list, although some confusion as to why he is taking this. He does have history of low back pain/lumbar laminectomy, possibly related to this  HTN: Current medication includes lisinopril, denies any issues medication, no other medications used in the past.  Does not check blood pressure regularly at home.  Denies any current issues with chest pain or headaches.  GERD: Currently utilizes omeprazole to help with  controlling symptoms, requesting refill of this today  Patient is originally from [place] (did move there as a refugee).  Patient has been living here since about 2012, was in [place] prior to moving to the area locally. He does enjoy going for walks.  Outpatient Encounter Medications as of 11/12/2022  Medication Sig   atorvastatin (LIPITOR) 40 MG tablet Take 40 mg by mouth at bedtime.   dicyclomine (BENTYL) 20 MG tablet Take 0.5 tablets (10 mg total) by mouth 2 (two) times daily for 7 days.   sucralfate (CARAFATE) 1 g tablet Take 1 tablet (1 g total) by mouth with breakfast, with lunch, and with evening meal for 14 days.   [DISCONTINUED] gabapentin (NEURONTIN) 300 MG capsule Take 300 mg by mouth 3 (three) times daily.   [DISCONTINUED] lisinopril (ZESTRIL) 10 MG tablet Take 10 mg by mouth daily.   [DISCONTINUED] omeprazole (PRILOSEC) 40 MG capsule Take 40 mg by mouth daily.   [DISCONTINUED] tamsulosin (FLOMAX) 0.4 MG CAPS capsule Take 0.4 mg by mouth daily.   [DISCONTINUED] Vitamin D, Ergocalciferol, (DRISDOL) 1.25 MG (50000 UNIT) CAPS capsule Take 50,000 Units by mouth every Sunday.   gabapentin (NEURONTIN) 300 MG capsule Take 1 capsule (300 mg total) by mouth 3 (three) times daily.   lisinopril (ZESTRIL) 10 MG tablet Take 1 tablet (10 mg total) by mouth daily.   omeprazole (PRILOSEC) 40 MG capsule Take 1 capsule (40 mg total) by mouth daily.   tamsulosin (FLOMAX) 0.4 MG CAPS capsule Take 1 capsule (0.4 mg total) by mouth daily.   [  DISCONTINUED] acetaminophen-codeine (TYLENOL #3) 300-30 MG tablet Take 1-2 tablets by mouth every 6 (six) hours as needed for moderate pain.   [DISCONTINUED] aluminum-magnesium hydroxide-simethicone (MAALOX) 494-496-75 MG/5ML SUSP Take 15 mLs by mouth 3 (three) times daily as needed (indigestion).   [DISCONTINUED] hyoscyamine (LEVSIN) 0.125 MG tablet Take 0.125 mg by mouth every 4 (four) hours as needed for cramping.   [DISCONTINUED] KLOR-CON M20 20 MEQ tablet Take 20  mEq by mouth daily.   [DISCONTINUED] ondansetron (ZOFRAN-ODT) 4 MG disintegrating tablet Take 1 tablet (4 mg total) by mouth every 8 (eight) hours as needed for nausea or vomiting.   No facility-administered encounter medications on file as of 11/12/2022.    Past Medical History:  Diagnosis Date   Acid reflux    Arthritis    right leg   Cataract    bil removed   Hypertension     Past Surgical History:  Procedure Laterality Date   cataracts Bilateral    LUMBAR LAMINECTOMY/DECOMPRESSION MICRODISCECTOMY Right 12/25/2016   Procedure: Lumbar three-five Laminectomy, Right Lumbar three-four, Lumbar four-five Diskectomy ;  Surgeon: Kevan Ny Ditty, MD;  Location: [facility];  Service: Neurosurgery;  Laterality: Right;    Family History  Problem Relation Age of Onset   Colon cancer Neg Hx    Esophageal cancer Neg Hx    Pancreatic cancer Neg Hx    Prostate cancer Neg Hx    Rectal cancer Neg Hx    Stomach cancer Neg Hx     Social History   Socioeconomic History   Marital status: Married    Spouse name: Not on file   Number of children: Not on file   Years of education: Not on file   Highest education level: Not on file  Occupational History   Not on file  Tobacco Use   Smoking status: Former    Packs/day: 0.25    Years: 40.00    Total pack years: 10.00    Types: Cigarettes    Quit date: 12/17/2011    Years since quitting: 10.9   Smokeless tobacco: Never  Vaping Use   Vaping Use: Never used  Substance and Sexual Activity   Alcohol use: No   Drug use: No   Sexual activity: Not on file  Other Topics Concern   Not on file  Social History Narrative   Not on file   Social Determinants of Health   Financial Resource Strain: Not on file  Food Insecurity: Not on file  Transportation Needs: Not on file  Physical Activity: Not on file  Stress: Not on file  Social Connections: Not on file  Intimate Partner Violence: Not on file    Objective    BP 124/77 (BP  Location: Left Arm, Patient Position: Sitting, Cuff Size: Normal)   Pulse 63   Temp 97.8 F (36.6 C) (Oral)   Ht '5\' 2"'$  (1.575 m)   Wt 185 lb 8 oz (84.1 kg)   SpO2 98%   BMI 33.93 kg/m   Physical Exam  71 year old male in no acute distress Cardiovascular exam with regular rate and rhythm, no murmur appreciated Lungs clear to auscultation bilaterally  Assessment & Plan:   Problem List Items Addressed This Visit       Cardiovascular and Mediastinum   Primary hypertension    Blood pressure at goal in office today.  We can continue with current medication regimens with no changes made today Recommend intermittent monitoring of blood pressure at home, DASH diet  Relevant Medications   atorvastatin (LIPITOR) 40 MG tablet   lisinopril (ZESTRIL) 10 MG tablet     Genitourinary   Lesion of bladder    Noted in the past and had prior cystoscopy with urologist in [place].  He is overdue for follow-up with urology since he left the area where he was following with urologist.  Requesting referral to establish with a local urologist, referral placed today      Relevant Orders   Ambulatory referral to Urology     Other   Chronic abdominal pain - Primary    Patient has been having chronic, intermittent abdominal pain which has been going on for some time now.  He has had prior emergency department evaluation related to ongoing intermittent symptoms.  Given duration of symptoms, patient age, recommend evaluation with gastroenterologist which patient and family are also requesting today, referral placed to establish with gastroenterologist locally for further evaluation      Relevant Medications   gabapentin (NEURONTIN) 300 MG capsule   Other Relevant Orders   Ambulatory referral to Gastroenterology    Return in about 3 months (around 02/12/2023) for HTN, referrals.   Eliany Mccarter J De [place], MD

## 2022-11-12 NOTE — Assessment & Plan Note (Signed)
Patient has been having chronic, intermittent abdominal pain which has been going on for some time now.  He has had prior emergency department evaluation related to ongoing intermittent symptoms.  Given duration of symptoms, patient age, recommend evaluation with gastroenterologist which patient and family are also requesting today, referral placed to establish with gastroenterologist locally for further evaluation

## 2022-11-12 NOTE — Patient Instructions (Signed)
  Medication Instructions:  Your physician recommends that you continue on your current medications as directed. Please refer to the Current Medication list given to you today. --If you need a refill on any your medications before your next appointment, please call your pharmacy first. If no refills are authorized on file call the office.-- Lab Work: Your physician has recommended that you have lab work today: No If you have labs (blood work) drawn today and your tests are completely normal, you will receive your results via [facility] a phone call from our staff.  Please ensure you check your voicemail in the event that you authorized detailed messages to be left on a delegated number. If you have any lab test that is abnormal or we need to change your treatment, we will call you to review the results.  Referrals/Procedures/Imaging: Yes  Follow-Up: Your next appointment:   Your physician recommends that you schedule a follow-up appointment in: 3 months with Dr. de [place]  You will receive a text message or e-mail with a link to a survey about your care and experience with [place] today! We would greatly appreciate your feedback!   Thanks for letting us be apart of your health journey!!  Primary Care and Sports Medicine   Dr. Arlina Robes [place]   We encourage you to activate your patient portal called "MyChart".  Sign up information is provided on this After Visit Summary.  MyChart is used to connect with patients for Virtual Visits (Telemedicine).  Patients are able to view lab/test results, encounter notes, upcoming appointments, etc.  Non-urgent messages can be sent to your provider as well. To learn more about what you can do with MyChart, please visit --  [email or web address].

## 2022-11-12 NOTE — Assessment & Plan Note (Signed)
Noted in the past and had prior cystoscopy with urologist in [place].  He is overdue for follow-up with urology since he left the area where he was following with urologist.  Requesting referral to establish with a local urologist, referral placed today

## 2022-11-12 NOTE — Assessment & Plan Note (Signed)
Blood pressure at goal in office today.  We can continue with current medication regimens with no changes made today Recommend intermittent monitoring of blood pressure at home, DASH diet

## 2022-12-05 ENCOUNTER — Emergency Department (HOSPITAL_BASED_OUTPATIENT_CLINIC_OR_DEPARTMENT_OTHER)
Admission: EM | Admit: 2022-12-05 | Discharge: 2022-12-05 | Disposition: A | Discharge status: Home / Self Care | Payer: Medicare Other | Attending: Emergency Medicine | Admitting: Emergency Medicine

## 2022-12-05 ENCOUNTER — Encounter (HOSPITAL_BASED_OUTPATIENT_CLINIC_OR_DEPARTMENT_OTHER): Payer: Self-pay | Admitting: Emergency Medicine

## 2022-12-05 ENCOUNTER — Other Ambulatory Visit: Payer: Self-pay

## 2022-12-05 ENCOUNTER — Emergency Department (HOSPITAL_BASED_OUTPATIENT_CLINIC_OR_DEPARTMENT_OTHER): Payer: Medicare Other

## 2022-12-05 DIAGNOSIS — R1013 Epigastric pain: Secondary | ICD-10-CM

## 2022-12-05 DIAGNOSIS — R1084 Generalized abdominal pain: Secondary | ICD-10-CM | POA: Diagnosis not present

## 2022-12-05 DIAGNOSIS — Z79899 Other long term (current) drug therapy: Secondary | ICD-10-CM | POA: Diagnosis not present

## 2022-12-05 DIAGNOSIS — R1010 Upper abdominal pain, unspecified: Secondary | ICD-10-CM | POA: Diagnosis present

## 2022-12-05 DIAGNOSIS — I10 Essential (primary) hypertension: Secondary | ICD-10-CM | POA: Insufficient documentation

## 2022-12-05 DIAGNOSIS — Z8719 Personal history of other diseases of the digestive system: Secondary | ICD-10-CM

## 2022-12-05 LAB — CBC WITH DIFFERENTIAL/PLATELET
Abs Immature Granulocytes: 0.01 10*3/uL (ref 0.00–0.07)
Basophils Absolute: 0 10*3/uL (ref 0.0–0.1)
Basophils Relative: 1 %
Eosinophils Absolute: 0.1 10*3/uL (ref 0.0–0.5)
Eosinophils Relative: 3 %
HCT: 42.1 % (ref 39.0–52.0)
Hemoglobin: 14.9 g/dL (ref 13.0–17.0)
Immature Granulocytes: 0 %
Lymphocytes Relative: 49 %
Lymphs Abs: 2.3 10*3/uL (ref 0.7–4.0)
MCH: 30.5 pg (ref 26.0–34.0)
MCHC: 35.4 g/dL (ref 30.0–36.0)
MCV: 86.3 fL (ref 80.0–100.0)
Monocytes Absolute: 0.4 10*3/uL (ref 0.1–1.0)
Monocytes Relative: 8 %
Neutro Abs: 1.8 10*3/uL (ref 1.7–7.7)
Neutrophils Relative %: 39 %
Platelets: 163 10*3/uL (ref 150–400)
RBC: 4.88 MIL/uL (ref 4.22–5.81)
RDW: 12.4 % (ref 11.5–15.5)
WBC: 4.7 10*3/uL (ref 4.0–10.5)
nRBC: 0 % (ref 0.0–0.2)

## 2022-12-05 LAB — COMPREHENSIVE METABOLIC PANEL
ALT: 35 U/L (ref 0–44)
AST: 29 U/L (ref 15–41)
Albumin: 4.7 g/dL (ref 3.5–5.0)
Alkaline Phosphatase: 82 U/L (ref 38–126)
Anion gap: 10 (ref 5–15)
BUN: 9 mg/dL (ref 8–23)
CO2: 23 mmol/L (ref 22–32)
Calcium: 9.4 mg/dL (ref 8.9–10.3)
Chloride: 103 mmol/L (ref 98–111)
Creatinine, Ser: 0.92 mg/dL (ref 0.61–1.24)
GFR, Estimated: >60 mL/min (ref >60)
Glucose, Bld: 115 mg/dL — ABNORMAL HIGH (ref 70–99)
Potassium: 4.2 mmol/L (ref 3.5–5.1)
Sodium: 136 mmol/L (ref 135–145)
Total Bilirubin: 1.2 mg/dL (ref 0.3–1.2)
Total Protein: 7.6 g/dL (ref 6.5–8.1)

## 2022-12-05 LAB — LIPASE, BLOOD: Lipase: <10 U/L — ABNORMAL LOW (ref 11–51)

## 2022-12-05 LAB — TROPONIN I (HIGH SENSITIVITY): Troponin I (High Sensitivity): 6 ng/L (ref <18)

## 2022-12-05 MED ORDER — LIDOCAINE VISCOUS HCL 2 % MT SOLN
15.0000 mL | Freq: Once | OROMUCOSAL | Status: AC
  Administered 2022-12-05: 15 mL via ORAL
  Filled 2022-12-05: qty 15

## 2022-12-05 MED ORDER — IOHEXOL 300 MG/ML  SOLN
80.0000 mL | Freq: Once | INTRAMUSCULAR | Status: AC | PRN
  Administered 2022-12-05: 80 mL via INTRAVENOUS

## 2022-12-05 MED ORDER — ALUM & MAG HYDROXIDE-SIMETH 200-200-20 MG/5ML PO SUSP
30.0000 mL | Freq: Once | ORAL | Status: AC
  Administered 2022-12-05: 30 mL via ORAL
  Filled 2022-12-05: qty 30

## 2022-12-05 MED ORDER — PANTOPRAZOLE SODIUM 40 MG PO TBEC: 40.0000 mg | DELAYED_RELEASE_TABLET | Freq: Two times a day (BID) | ORAL | 1 refills | Status: DC

## 2022-12-05 NOTE — ED Triage Notes (Signed)
Pt BIB with daughter for abd pain and dizziness, pt reports feels similar to gastritis, abd pain started yesterday, worsening this am

## 2022-12-05 NOTE — ED Provider Notes (Signed)
[facility] EMERGENCY DEPT Provider Note   CSN: [id number] Arrival date & time: 12/05/22  0457     History  Chief Complaint  Patient presents with   Abdominal Pain    Jorge [name] is a 71 y.o. male.  Patient is a 71 year old male with past medical history of hyperlipidemia, hypertension, GERD, and recurrent abdominal pain.  Patient presenting with complaints of abdominal pain.  He describes pain across his upper abdomen that radiates through to his neck.  He describes this as a burning.  It sounds as though this is a chronic problem for him, but seem to worsen yesterday.  He denies any shortness of breath, nausea, or diaphoresis.  He denies any recent exertional symptoms.  He has no prior cardiac history.  There are no aggravating or alleviating symptoms.  Patient is accompanied by his daughter who acts as Optometrist.  Patient speaks mainly [place].  The history is provided by the patient and a relative. The history is limited by a language barrier.       Home Medications Prior to Admission medications   Medication Sig Start Date End Date Taking? Authorizing Provider  atorvastatin (LIPITOR) 40 MG tablet Take 40 mg by mouth at bedtime.    [provider]  dicyclomine (BENTYL) 20 MG tablet Take 0.5 tablets (10 mg total) by mouth 2 (two) times daily for 7 days. 11/08/22 11/15/22  Wynona Dove A, DO  gabapentin (NEURONTIN) 300 MG capsule Take 1 capsule (300 mg total) by mouth 3 (three) times daily. 11/12/22   de [place], Blondell Reveal, MD  lisinopril (ZESTRIL) 10 MG tablet Take 1 tablet (10 mg total) by mouth daily. 11/12/22   de [place], Raymond J, MD  omeprazole (PRILOSEC) 40 MG capsule Take 1 capsule (40 mg total) by mouth daily. 11/12/22   [place], Blondell Reveal, MD  sucralfate (CARAFATE) 1 g tablet Take 1 tablet (1 g total) by mouth with breakfast, with lunch, and with evening meal for 14 days. 11/08/22 11/22/22  Jeanell Sparrow, DO  tamsulosin (FLOMAX) 0.4 MG CAPS  capsule Take 1 capsule (0.4 mg total) by mouth daily. 11/12/22   [place], Raymond J, MD      Allergies    Patient has no known allergies.    Review of Systems   Review of Systems  All other systems reviewed and are negative.   Physical Exam Updated Vital Signs BP 137/75 (BP Location: Left Arm)   Pulse (!) 53   Temp 97.9 F (36.6 C) (Oral)   Resp 16   Ht '5\' 2"'$  (1.575 m)   Wt 83.9 kg   SpO2 96%   BMI 33.84 kg/m  Physical Exam Vitals and nursing note reviewed.  Constitutional:      General: He is not in acute distress.    Appearance: He is well-developed. He is not diaphoretic.  HENT:     Head: Normocephalic and atraumatic.  Cardiovascular:     Rate and Rhythm: Normal rate and regular rhythm.     Heart sounds: No murmur heard.    No friction rub.  Pulmonary:     Effort: Pulmonary effort is normal. No respiratory distress.     Breath sounds: Normal breath sounds. No wheezing or rales.  Abdominal:     General: Bowel sounds are normal. There is no distension.     Palpations: Abdomen is soft.     Tenderness: There is generalized abdominal tenderness. There is no right CVA tenderness, left CVA tenderness, guarding  or rebound.  Musculoskeletal:        General: Normal range of motion.     Cervical back: Normal range of motion and neck supple.  Skin:    General: Skin is warm and dry.  Neurological:     Mental Status: He is alert and oriented to person, place, and time.     Coordination: Coordination normal.     ED Results / Procedures / Treatments   Labs (all labs ordered are listed, but only abnormal results are displayed) Labs Reviewed - No data to display  EKG EKG Interpretation  Date/Time:  Saturday December 05 2022 05:21:07 EST Ventricular Rate:  50 PR Interval:  168 QRS Duration: 90 QT Interval:  416 QTC Calculation: 380 R Axis:   55 Text Interpretation: Sinus rhythm Normal ECG No significant change since 03/01/2022 Confirmed by Veryl Speak [phone]) on  12/05/2022 5:24:11 AM  Radiology No results found.  Procedures Procedures    Medications Ordered in ED Medications  alum & mag hydroxide-simeth (MAALOX/MYLANTA) 200-200-20 MG/5ML suspension 30 mL (has no administration in time range)    And  lidocaine (XYLOCAINE) 2 % viscous mouth solution 15 mL (has no administration in time range)    ED Course/ Medical Decision Making/ A&P  Patient is a 71 year old male with a past medical history as per HPI presenting with complaints of abdominal pain.  He states that this radiates up into his throat and describes it as a burning sensation.  Patient arrives here with stable vital signs and is afebrile.  There is mild generalized tenderness throughout the abdomen, however physical examination is otherwise unremarkable.  Workup initiated including CBC, CMP, and lipase.  These were all unremarkable.  EKG obtained showing a sinus rhythm with no change from prior studies.  An initial troponin obtained also returned at 6 despite discomfort since yesterday morning.  I highly doubt a cardiac etiology and do not feel as though any further workup is indicated into this.  As far as the abdominal pain goes, a CT scan was obtained showing no evidence for intra-abdominal abnormality.  He was given a GI cocktail with near complete relief of his symptoms.  I highly suspect his symptoms are GI related and will change his Prilosec to Protonix.  I will also have him continue taking Carafate.  Patient is somewhat new to the area from [place] living here with his daughter.  It would also benefit him to establish care with a gastroenterologist and a referral will be placed.  Final Clinical Impression(s) / ED Diagnoses Final diagnoses:  None    Rx / DC Orders ED Discharge Orders     None         Veryl Speak, MD 12/05/22 [phone]

## 2022-12-05 NOTE — Discharge Instructions (Addendum)
Stop taking Prilosec.  Begin taking Protonix as prescribed today.  Follow-up with gastroenterology in the near future.  A referral has been placed to [facility] gastroenterology and they are contact information has been provided in this discharge summary for you to call and make these arrangements.  Return to the emergency department for worsening pain, high fever, bloody stool or vomit, or for other new and concerning symptoms.

## 2023-01-04 ENCOUNTER — Other Ambulatory Visit (HOSPITAL_COMMUNITY): Payer: Self-pay

## 2023-01-04 ENCOUNTER — Other Ambulatory Visit (HOSPITAL_BASED_OUTPATIENT_CLINIC_OR_DEPARTMENT_OTHER): Payer: Self-pay

## 2023-01-04 ENCOUNTER — Other Ambulatory Visit (HOSPITAL_BASED_OUTPATIENT_CLINIC_OR_DEPARTMENT_OTHER): Payer: Self-pay | Admitting: Family Medicine

## 2023-01-05 ENCOUNTER — Other Ambulatory Visit (HOSPITAL_BASED_OUTPATIENT_CLINIC_OR_DEPARTMENT_OTHER): Payer: Self-pay

## 2023-01-05 MED ORDER — GABAPENTIN 300 MG PO CAPS
300.0000 mg | ORAL_CAPSULE | Freq: Three times a day (TID) | ORAL | 1 refills | Status: DC
  Filled 2023-01-05: qty 90, 30d supply, fill #0
  Filled 2023-02-05 (×2): qty 90, 30d supply, fill #1

## 2023-01-07 ENCOUNTER — Encounter (HOSPITAL_BASED_OUTPATIENT_CLINIC_OR_DEPARTMENT_OTHER): Payer: Self-pay | Admitting: Pharmacist

## 2023-01-07 ENCOUNTER — Other Ambulatory Visit (HOSPITAL_BASED_OUTPATIENT_CLINIC_OR_DEPARTMENT_OTHER): Payer: Self-pay

## 2023-01-08 ENCOUNTER — Ambulatory Visit (INDEPENDENT_AMBULATORY_CARE_PROVIDER_SITE_OTHER): Payer: Medicare Other | Admitting: Urology

## 2023-01-08 ENCOUNTER — Other Ambulatory Visit: Payer: Self-pay

## 2023-01-08 ENCOUNTER — Other Ambulatory Visit (HOSPITAL_BASED_OUTPATIENT_CLINIC_OR_DEPARTMENT_OTHER): Payer: Self-pay

## 2023-01-08 VITALS — BP 119/65 | HR 73

## 2023-01-08 DIAGNOSIS — N369 Urethral disorder, unspecified: Secondary | ICD-10-CM

## 2023-01-08 DIAGNOSIS — N3289 Other specified disorders of bladder: Secondary | ICD-10-CM | POA: Diagnosis not present

## 2023-01-08 DIAGNOSIS — N329 Bladder disorder, unspecified: Secondary | ICD-10-CM

## 2023-01-08 LAB — URINALYSIS, ROUTINE W REFLEX MICROSCOPIC
Bilirubin, UA: NEGATIVE
Glucose, UA: NEGATIVE
Ketones, UA: NEGATIVE
Leukocytes,UA: NEGATIVE
Nitrite, UA: NEGATIVE
Protein,UA: NEGATIVE
RBC, UA: NEGATIVE
Specific Gravity, UA: 1.015 (ref 1.005–1.030)
Urobilinogen, Ur: 0.2 mg/dL (ref 0.2–1.0)
pH, UA: 6.5 (ref 5.0–7.5)

## 2023-01-08 MED ORDER — CIPROFLOXACIN HCL 500 MG PO TABS
500.0000 mg | ORAL_TABLET | Freq: Once | ORAL | Status: AC
  Administered 2023-01-08: 500 mg via ORAL

## 2023-01-08 NOTE — Progress Notes (Unsigned)
01/08/2023 10:49 AM   Jorge [name] Jun 09, 1951 [id number]  Referring provider: de [place], Raymond J, MD [address] [place],  [place] [address]  Bladder lesion   HPI:  [name] is a 72yo here for evaluation of a bladder mass. He was diagnosed with a bladder tumor in [place] over 1 year ago. He has not had a cystoscopy in 1 year. He has intermittent dysuria for the past 2-3 months.   PMH: Past Medical History:  Diagnosis Date   Acid reflux    Arthritis    right leg   Cataract    bil removed   Hypertension     Surgical History: Past Surgical History:  Procedure Laterality Date   cataracts Bilateral    LUMBAR LAMINECTOMY/DECOMPRESSION MICRODISCECTOMY Right 12/25/2016   Procedure: Lumbar three-five Laminectomy, Right Lumbar three-four, Lumbar four-five Diskectomy ;  Surgeon: Kevan Ny Ditty, MD;  Location: [facility];  Service: Neurosurgery;  Laterality: Right;    Home Medications:  Allergies as of 01/08/2023   No Known Allergies      Medication List        Accurate as of January 08, 2023 10:49 AM. If you have any questions, ask your nurse or doctor.          atorvastatin 40 MG tablet Commonly known as: LIPITOR Take 40 mg by mouth at bedtime.   dicyclomine 20 MG tablet Commonly known as: BENTYL Take 0.5 tablets (10 mg total) by mouth 2 (two) times daily for 7 days.   gabapentin 300 MG capsule Commonly known as: NEURONTIN Take 1 capsule (300 mg total) by mouth 3 (three) times daily.   Klor-Con M20 20 MEQ tablet Generic drug: potassium chloride SA Take 20 mEq by mouth daily.   lisinopril 10 MG tablet Commonly known as: ZESTRIL Take 1 tablet (10 mg total) by mouth daily.   omeprazole 40 MG capsule Commonly known as: PRILOSEC Take 1 capsule (40 mg total) by mouth daily.   pantoprazole 40 MG tablet Commonly known as: Protonix Take 1 tablet (40 mg total) by mouth 2 (two) times daily before a meal.   sucralfate 1 g tablet Commonly known as:  Carafate Take 1 tablet (1 g total) by mouth with breakfast, with lunch, and with evening meal for 14 days.   tamsulosin 0.4 MG Caps capsule Commonly known as: FLOMAX Take 1 capsule (0.4 mg total) by mouth daily.        Allergies: No Known Allergies  Family History: Family History  Problem Relation Age of Onset   Colon cancer Neg Hx    Esophageal cancer Neg Hx    Pancreatic cancer Neg Hx    Prostate cancer Neg Hx    Rectal cancer Neg Hx    Stomach cancer Neg Hx     Social History:  reports that he quit smoking about 11 years ago. His smoking use included cigarettes. He has a 10.00 pack-year smoking history. He has never used smokeless tobacco. He reports that he does not drink alcohol and does not use drugs.  ROS: All other review of systems were reviewed and are negative except what is noted above in HPI  Physical Exam: BP 119/65   Pulse 73   Constitutional:  Alert and oriented, No acute distress. HEENT: [place] AT, moist mucus membranes.  Trachea midline, no masses. Cardiovascular: No clubbing, cyanosis, or edema. Respiratory: Normal respiratory effort, no increased work of breathing. GI: Abdomen is soft, nontender, nondistended, no abdominal masses GU: No CVA tenderness.  Lymph: No cervical or inguinal lymphadenopathy. Skin: No rashes, bruises or suspicious lesions. Neurologic: Grossly intact, no focal deficits, moving all 4 extremities. Psychiatric: Normal mood and affect.  Laboratory Data: Lab Results  Component Value Date   WBC 4.7 12/05/2022   HGB 14.9 12/05/2022   HCT 42.1 12/05/2022   MCV 86.3 12/05/2022   PLT 163 12/05/2022    Lab Results  Component Value Date   CREATININE 0.92 12/05/2022    No results found for: "PSA"  No results found for: "TESTOSTERONE"  Lab Results  Component Value Date   HGBA1C 5.9 (H) 12/16/2016    Urinalysis    Component Value Date/Time   COLORURINE COLORLESS (A) 11/07/2022 2031   APPEARANCEUR CLEAR 11/07/2022 2031    LABSPEC <1.005 (L) 11/07/2022 2031   PHURINE 7.5 11/07/2022 2031   GLUCOSEU NEGATIVE 11/07/2022 2031   HGBUR NEGATIVE 11/07/2022 2031   BILIRUBINUR NEGATIVE 11/07/2022 2031   KETONESUR NEGATIVE 11/07/2022 2031   PROTEINUR NEGATIVE 11/07/2022 2031   NITRITE NEGATIVE 11/07/2022 2031   LEUKOCYTESUR NEGATIVE 11/07/2022 2031    Lab Results  Component Value Date   BACTERIA NONE SEEN 03/28/2021    Pertinent Imaging: *** Results for orders placed during the hospital encounter of 12/10/16  DG Abdomen 1 View  Narrative CLINICAL DATA:  72 y/o  M; 2-3 days of constipation.  EXAM: ABDOMEN - 1 VIEW  COMPARISON:  None.  FINDINGS: Normal bowel gas pattern. Small volume of stool within the colon. No radiopaque stone disease identified.  IMPRESSION: Negative.   Electronically Signed By: Kristine Garbe M.D. On: 12/10/2016 04:10  No results found for this or any previous visit.  No results found for this or any previous visit.  No results found for this or any previous visit.  No results found for this or any previous visit.  No valid procedures specified. No results found for this or any previous visit.  Results for orders placed during the hospital encounter of 04/01/22  CT Renal Stone Study  Narrative CLINICAL DATA:  Flank pain, kidney stone suspected. Painful urination. Constipation. Left lower quadrant pain.  EXAM: CT ABDOMEN AND PELVIS WITHOUT CONTRAST  TECHNIQUE: Multidetector CT imaging of the abdomen and pelvis was performed following the standard protocol without IV contrast.  RADIATION DOSE REDUCTION: This exam was performed according to the departmental dose-optimization program which includes automated exposure control, adjustment of the mA and/or kV according to patient size and/or use of iterative reconstruction technique.  COMPARISON:  01/18/2022  FINDINGS: Lower chest: Mild dependent atelectasis in the lung bases.  Hepatobiliary: No  focal liver abnormality is seen. No gallstones, gallbladder wall thickening, or biliary dilatation.  Pancreas: Unremarkable. No pancreatic ductal dilatation or surrounding inflammatory changes.  Spleen: Normal in size without focal abnormality.  Adrenals/Urinary Tract: Adrenal glands are unremarkable. Kidneys are normal, without renal calculi, focal lesion, or hydronephrosis. Bladder is unremarkable.  Stomach/Bowel: Stomach is within normal limits. Appendix appears normal. No evidence of bowel wall thickening, distention, or inflammatory changes.  Vascular/Lymphatic: Aortic atherosclerosis. No enlarged abdominal or pelvic lymph nodes.  Reproductive: Prostate is unremarkable.  Other: No abdominal wall hernia or abnormality. No abdominopelvic ascites.  Musculoskeletal: No acute or significant osseous findings. Postoperative laminectomies at L4 and L5. 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  IMPRESSION: 1. No renal or ureteral stone or obstruction. 2. No evidence of bowel obstruction or inflammation. 3. Aortic atherosclerosis.   Electronically Signed By: Lucienne Capers M.D. On: 04/01/2022 01:08   Assessment & Plan:  1. Lesion of bladder *** - Urinalysis, Routine w reflex microscopic   No follow-ups on file.  Nicolette Bang, MD  [facility] [place]    01/08/23  CC: No chief complaint on file.   HPI:  Blood pressure 119/65, pulse 73. NED. A&Ox3.   No respiratory distress   Abd soft, NT, ND Normal phallus with bilateral descended testicles  Cystoscopy Procedure Note  Patient identification was confirmed, informed consent was obtained, and patient was prepped using Betadine solution.  Lidocaine jelly was administered per urethral meatus.     Pre-Procedure: - Inspection reveals a normal caliber ureteral meatus.  Procedure: The flexible cystoscope was introduced without difficulty - No urethral strictures/lesions are present. - {Blank  multiple:19197::"Enlarged","Surgically absent","Normal"} prostate *** - {Blank multiple:19197::"Normal","Elevated","Tight"} bladder neck - Bilateral ureteral orifices identified - Bladder mucosa  reveals no ulcers, tumors, or lesions - No bladder stones - No trabeculation  Retroflexion shows ***   Post-Procedure: - Patient tolerated the procedure well  Assessment/ Plan:   No follow-ups on file.  Nicolette Bang, MD

## 2023-01-11 LAB — CYTOLOGY, URINE

## 2023-01-12 ENCOUNTER — Encounter: Payer: Self-pay | Admitting: Urology

## 2023-01-19 ENCOUNTER — Telehealth: Payer: Self-pay

## 2023-01-19 NOTE — Telephone Encounter (Signed)
I attempted to reach patient by phone using the language line for Nepali- no answer on mobile phone. Message was left to return call to our office.  Home phone number per person answering phone- patient doesn't live there

## 2023-01-20 NOTE — Telephone Encounter (Signed)
Brother returned my call.  I spoke with patients Brother. We have discussed possible surgery dates and 02/18/2023 was agreed upon by all parties. Patient given information about surgery date, what to expect pre-operatively and post operatively.    We discussed that a pre-op nurse will be calling to set up the pre-op visit that will take place prior to surgery. Informed patient that our office will communicate any additional care to be provided after surgery.    Patients questions or concerns were discussed during our call. Advised to call our office should there be any additional information, questions or concerns that arise. Patient verbalized understanding.    Brothers number is [phone]

## 2023-01-25 ENCOUNTER — Other Ambulatory Visit (HOSPITAL_BASED_OUTPATIENT_CLINIC_OR_DEPARTMENT_OTHER): Payer: Self-pay

## 2023-01-25 ENCOUNTER — Emergency Department (HOSPITAL_BASED_OUTPATIENT_CLINIC_OR_DEPARTMENT_OTHER)
Admission: EM | Admit: 2023-01-25 | Discharge: 2023-01-25 | Disposition: A | Discharge status: Home / Self Care | Payer: Medicare Other | Attending: Emergency Medicine | Admitting: Emergency Medicine

## 2023-01-25 ENCOUNTER — Other Ambulatory Visit: Payer: Self-pay

## 2023-01-25 ENCOUNTER — Emergency Department (HOSPITAL_BASED_OUTPATIENT_CLINIC_OR_DEPARTMENT_OTHER): Payer: Medicare Other

## 2023-01-25 ENCOUNTER — Encounter (HOSPITAL_BASED_OUTPATIENT_CLINIC_OR_DEPARTMENT_OTHER): Payer: Self-pay | Admitting: Emergency Medicine

## 2023-01-25 DIAGNOSIS — R1032 Left lower quadrant pain: Secondary | ICD-10-CM | POA: Diagnosis not present

## 2023-01-25 DIAGNOSIS — R300 Dysuria: Secondary | ICD-10-CM | POA: Diagnosis not present

## 2023-01-25 DIAGNOSIS — R1030 Lower abdominal pain, unspecified: Secondary | ICD-10-CM | POA: Diagnosis present

## 2023-01-25 DIAGNOSIS — I10 Essential (primary) hypertension: Secondary | ICD-10-CM | POA: Diagnosis not present

## 2023-01-25 DIAGNOSIS — Z79899 Other long term (current) drug therapy: Secondary | ICD-10-CM | POA: Diagnosis not present

## 2023-01-25 LAB — CBC WITH DIFFERENTIAL/PLATELET
Abs Immature Granulocytes: 0.02 10*3/uL (ref 0.00–0.07)
Basophils Absolute: 0 10*3/uL (ref 0.0–0.1)
Basophils Relative: 1 %
Eosinophils Absolute: 0.1 10*3/uL (ref 0.0–0.5)
Eosinophils Relative: 2 %
HCT: 40.1 % (ref 39.0–52.0)
Hemoglobin: 14.3 g/dL (ref 13.0–17.0)
Immature Granulocytes: 0 %
Lymphocytes Relative: 23 %
Lymphs Abs: 1.1 10*3/uL (ref 0.7–4.0)
MCH: 30.7 pg (ref 26.0–34.0)
MCHC: 35.7 g/dL (ref 30.0–36.0)
MCV: 86.1 fL (ref 80.0–100.0)
Monocytes Absolute: 0.9 10*3/uL (ref 0.1–1.0)
Monocytes Relative: 18 %
Neutro Abs: 2.7 10*3/uL (ref 1.7–7.7)
Neutrophils Relative %: 56 %
Platelets: 136 10*3/uL — ABNORMAL LOW (ref 150–400)
RBC: 4.66 MIL/uL (ref 4.22–5.81)
RDW: 11.9 % (ref 11.5–15.5)
WBC: 4.8 10*3/uL (ref 4.0–10.5)
nRBC: 0 % (ref 0.0–0.2)

## 2023-01-25 LAB — COMPREHENSIVE METABOLIC PANEL
ALT: 34 U/L (ref 0–44)
AST: 30 U/L (ref 15–41)
Albumin: 4.2 g/dL (ref 3.5–5.0)
Alkaline Phosphatase: 74 U/L (ref 38–126)
Anion gap: 9 (ref 5–15)
BUN: 7 mg/dL — ABNORMAL LOW (ref 8–23)
CO2: 25 mmol/L (ref 22–32)
Calcium: 9.5 mg/dL (ref 8.9–10.3)
Chloride: 104 mmol/L (ref 98–111)
Creatinine, Ser: 0.97 mg/dL (ref 0.61–1.24)
GFR, Estimated: >60 mL/min (ref >60)
Glucose, Bld: 113 mg/dL — ABNORMAL HIGH (ref 70–99)
Potassium: 3.6 mmol/L (ref 3.5–5.1)
Sodium: 138 mmol/L (ref 135–145)
Total Bilirubin: 1.1 mg/dL (ref 0.3–1.2)
Total Protein: 7.2 g/dL (ref 6.5–8.1)

## 2023-01-25 LAB — URINALYSIS, ROUTINE W REFLEX MICROSCOPIC
Bacteria, UA: NONE SEEN
Bilirubin Urine: NEGATIVE
Glucose, UA: NEGATIVE mg/dL
Hgb urine dipstick: NEGATIVE
Ketones, ur: NEGATIVE mg/dL
Leukocytes,Ua: NEGATIVE
Nitrite: NEGATIVE
Protein, ur: NEGATIVE mg/dL
Specific Gravity, Urine: 1.006 (ref 1.005–1.030)
pH: 7 (ref 5.0–8.0)

## 2023-01-25 LAB — LIPASE, BLOOD: Lipase: <10 U/L — ABNORMAL LOW (ref 11–51)

## 2023-01-25 LAB — TROPONIN I (HIGH SENSITIVITY): Troponin I (High Sensitivity): 4 ng/L (ref <18)

## 2023-01-25 MED ORDER — ALUM & MAG HYDROXIDE-SIMETH 200-200-20 MG/5ML PO SUSP
30.0000 mL | Freq: Once | ORAL | Status: AC
  Administered 2023-01-25: 30 mL via ORAL
  Filled 2023-01-25: qty 30

## 2023-01-25 MED ORDER — DICYCLOMINE HCL 20 MG PO TABS
10.0000 mg | ORAL_TABLET | Freq: Two times a day (BID) | ORAL | 0 refills | Status: DC
  Filled 2023-01-25: qty 7, 7d supply, fill #0

## 2023-01-25 NOTE — ED Triage Notes (Signed)
Pt arrives to ED with c/o lower abdominal pain and dysuria x3 days.

## 2023-01-25 NOTE — Discharge Instructions (Signed)
Your testing is reassuring.  No evidence of urinary tract infection, urinary retention or kidney stone.  Your blood work is stable.  Take the stomach medication as prescribed for pain and follow-up with your doctor.  Return to the ED with new or worsening symptoms.

## 2023-01-25 NOTE — ED Provider Notes (Signed)
[facility] Provider Note   CSN: [id number] Arrival date & time: 01/25/23  1937     History  Chief Complaint  Patient presents with   Dysuria   Abdominal Pain    Jorge [name] is a 72 y.o. male.  Patient with a history of hypertension, chronic abdominal pain, acid reflux, bladder mass currently being evaluated by urology presenting with lower abdominal pain and dysuria for the past 3 days.  Patient speaks Nepali.  Daughter at bedside is translating.  They declined medical translator.  Patient has intermittent left lower quadrant abdominal pain that radiates to his left testicle coming and going over the last 3 days.  Last for several hours at a time.  Associated with painful urination, urgency and frequency.  Denies any blood in the urine.  Denies any fevers, chills, nausea, vomiting, diarrhea or constipation.  Denies history of kidney stone.  Has required catheterization in the past.  Also having intermittent central chest pain similar to previous episodes of GERD lasting for several hours at a time.  Describes a burning sensation typical of his GERD without radiation, shortness of breath, nausea, vomiting or diaphoresis.  The history is provided by the patient and a relative. The history is limited by a language barrier. A language interpreter was used.  Dysuria Presenting symptoms: dysuria   Associated symptoms: abdominal pain and urinary frequency   Associated symptoms: no diarrhea, no fever, no flank pain, no hematuria, no nausea and no vomiting   Abdominal Pain Associated symptoms: dysuria   Associated symptoms: no chest pain, no constipation, no cough, no diarrhea, no fever, no hematuria, no nausea, no shortness of breath and no vomiting        Home Medications Prior to Admission medications   Medication Sig Start Date End Date Taking? Authorizing Provider  atorvastatin (LIPITOR) 40 MG tablet Take 40 mg by mouth at bedtime.     [provider]  dicyclomine (BENTYL) 20 MG tablet Take 0.5 tablets (10 mg total) by mouth 2 (two) times daily for 7 days. 11/08/22 11/15/22  Wynona Dove A, DO  gabapentin (NEURONTIN) 300 MG capsule Take 1 capsule (300 mg total) by mouth 3 (three) times daily. 01/05/23   de [place], Raymond J, MD  KLOR-CON M20 20 MEQ tablet Take 20 mEq by mouth daily. 01/02/23   [provider]  lisinopril (ZESTRIL) 10 MG tablet Take 1 tablet (10 mg total) by mouth daily. 11/12/22   de [place], Raymond J, MD  omeprazole (PRILOSEC) 40 MG capsule Take 1 capsule (40 mg total) by mouth daily. 11/12/22   de [place], Blondell Reveal, MD  pantoprazole (PROTONIX) 40 MG tablet Take 1 tablet (40 mg total) by mouth 2 (two) times daily before a meal. 12/05/22   Veryl Speak, MD  sucralfate (CARAFATE) 1 g tablet Take 1 tablet (1 g total) by mouth with breakfast, with lunch, and with evening meal for 14 days. 11/08/22 11/22/22  Jeanell Sparrow, DO  tamsulosin (FLOMAX) 0.4 MG CAPS capsule Take 1 capsule (0.4 mg total) by mouth daily. 11/12/22   de [place], Raymond J, MD      Allergies    Patient has no known allergies.    Review of Systems   Review of Systems  Constitutional:  Negative for activity change, appetite change and fever.  HENT:  Negative for congestion and rhinorrhea.   Respiratory:  Negative for cough, chest tightness and shortness of breath.   Cardiovascular:  Negative for  chest pain.  Gastrointestinal:  Positive for abdominal pain. Negative for constipation, diarrhea, nausea and vomiting.  Genitourinary:  Positive for dysuria, frequency and urgency. Negative for flank pain, hematuria and testicular pain.  Musculoskeletal:  Negative for arthralgias and back pain.  Skin:  Negative for rash.  Neurological:  Negative for dizziness, weakness and headaches.   all other systems are negative except as noted in the HPI and PMH.    Physical Exam Updated Vital Signs BP (!) 152/83   Pulse (!) 54   Temp 97.7 F (36.5  C) (Oral)   Resp 16   Ht '5\' 2"'$  (1.575 m)   Wt 83.9 kg   SpO2 96%   BMI 33.83 kg/m  Physical Exam Vitals and nursing note reviewed.  Constitutional:      General: He is not in acute distress.    Appearance: He is well-developed.  HENT:     Head: Normocephalic and atraumatic.     Mouth/Throat:     Pharynx: No oropharyngeal exudate.  Eyes:     Conjunctiva/sclera: Conjunctivae normal.     Pupils: Pupils are equal, round, and reactive to light.  Neck:     Comments: No meningismus. Cardiovascular:     Rate and Rhythm: Normal rate and regular rhythm.     Heart sounds: Normal heart sounds. No murmur heard.    Comments: Equal femoral and DP pulses bilaterally Pulmonary:     Effort: Pulmonary effort is normal. No respiratory distress.     Breath sounds: Normal breath sounds.  Abdominal:     Palpations: Abdomen is soft.     Tenderness: There is abdominal tenderness. There is no guarding or rebound.     Comments: Left lower quadrant tenderness, no guarding or rebound  Genitourinary:    Comments: Uncircumcised, testicles nontender bilaterally.  No warmth or erythema. Musculoskeletal:        General: Tenderness present. Normal range of motion.     Cervical back: Normal range of motion and neck supple.     Comments: Left CVA tenderness  Skin:    General: Skin is warm.  Neurological:     Mental Status: He is alert and oriented to person, place, and time.     Cranial Nerves: No cranial nerve deficit.     Motor: No abnormal muscle tone.     Coordination: Coordination normal.     Comments: No ataxia on finger to nose bilaterally. No pronator drift. 5/5 strength throughout. CN 2-12 intact.Equal grip strength. Sensation intact.   Psychiatric:        Behavior: Behavior normal.     ED Results / Procedures / Treatments   Labs (all labs ordered are listed, but only abnormal results are displayed) Labs Reviewed  URINALYSIS, ROUTINE W REFLEX MICROSCOPIC - Abnormal; Notable for the  following components:      Result Value   Color, Urine COLORLESS (*)    All other components within normal limits  CBC WITH DIFFERENTIAL/PLATELET - Abnormal; Notable for the following components:   Platelets 136 (*)    All other components within normal limits  COMPREHENSIVE METABOLIC PANEL - Abnormal; Notable for the following components:   Glucose, Bld 113 (*)    BUN 7 (*)    All other components within normal limits  LIPASE, BLOOD - Abnormal; Notable for the following components:   Lipase <10 (*)    All other components within normal limits  CBC WITH DIFFERENTIAL/PLATELET  TROPONIN I (HIGH SENSITIVITY)  TROPONIN I (HIGH SENSITIVITY)  EKG EKG Interpretation  Date/Time:  Monday January 25 2023 07:40:22 EST Ventricular Rate:  65 PR Interval:  176 QRS Duration: 89 QT Interval:  385 QTC Calculation: 401 R Axis:   86 Text Interpretation: Sinus rhythm Borderline right axis deviation Nonspecific T abnormalities, lateral leads Q wave lead III Confirmed by Ezequiel Essex [phone]) on 01/25/2023 8:01:16 AM  Radiology US SCROTUM W/DOPPLER  Result Date: 01/25/2023 CLINICAL DATA:  Testicular pain for 3 days EXAM: SCROTAL ULTRASOUND DOPPLER ULTRASOUND OF THE TESTICLES TECHNIQUE: Complete ultrasound examination of the testicles, epididymis, and other scrotal structures was performed. Color and spectral Doppler ultrasound were also utilized to evaluate blood flow to the testicles. COMPARISON:  Scrotal ultrasound 04/26/2022 FINDINGS: Right testicle Measurements: 4.5 cm x 2.4 cm x 3.2 cm. No mass or microlithiasis visualized. Left testicle Measurements: 4.4 cm x 2.3 cm x 2.5 cm. No mass or microlithiasis visualized. Right epididymis:  Normal in size and appearance. Left epididymis:  Normal in size and appearance. Hydrocele:  A small right hydrocele is seen, unchanged. Varicocele:  None visualized. Pulsed Doppler interrogation of both testes demonstrates normal low resistance arterial and venous  waveforms bilaterally. IMPRESSION: Small right hydrocele, unchanged. Otherwise, normal scrotal ultrasound. Electronically Signed   By: Valetta Mole M.D.   On: 01/25/2023 09:04   US Renal  Result Date: 01/25/2023 CLINICAL DATA:  Bilateral flank pain and dysuria EXAM: RENAL / URINARY TRACT ULTRASOUND COMPLETE COMPARISON:  None Available. FINDINGS: Right Kidney: Renal measurements: 11.0 x 5.5 x 5.4 cm = volume: 171.9 mL. Echogenicity within normal limits. No mass or hydronephrosis visualized. Left Kidney: Renal measurements: 10.0 x 5.2 x 5.5 cm = volume: 150.4 mL. Echogenicity within normal limits. No mass or hydronephrosis visualized. Bladder: Appears normal for degree of bladder distention. Other: Prostatomegaly, measuring 5.0 x 4.0 x 4.7 cm with a volume of 49.4 mL. IMPRESSION: 1. No hydronephrosis. 2. Prostatomegaly. Electronically Signed   By: Yetta Glassman M.D.   On: 01/25/2023 08:59    Procedures Procedures    Medications Ordered in ED Medications  alum & mag hydroxide-simeth (MAALOX/MYLANTA) 200-200-20 MG/5ML suspension 30 mL (has no administration in time range)    ED Course/ Medical Decision Making/ A&P                             Medical Decision Making Amount and/or Complexity of Data Reviewed Independent Historian: caregiver Labs: ordered. Decision-making details documented in ED Course. Radiology: ordered and independent interpretation performed. Decision-making details documented in ED Course. ECG/medicine tests: ordered and independent interpretation performed. Decision-making details documented in ED Course.  Risk OTC drugs. Prescription drug management.   Left lower quadrant abdominal pain with dysuria, frequency and urgency.  Vital stable, no distress.  Abdomen soft without peritoneal signs.  Given history of urinary retention will evaluate with bladder scan, check urinalysis, check labs.  Considered kidney stone, diverticulitis.  Patient with multiple CT scans  in the system without acute abnormalities including Dec 2023. No AAA.  No clear indication to repeat CT scan today given multiple recent negative studies in the past.  Bladder scan 50 mL. No urinary retention. UA negative for infection or hematuria.  Testicular ultrasound is negative for torsion.  Does show stable hydrocele.  Renal ultrasound shows no hydronephrosis or kidney stone.  Labs reassuring. LFTs and lipase normal. Stable thrombocytopenia.   On recheck, pain has resolved.  Abdomen is soft and nontender.  Patient is tolerating p.o.  Low suspicion  for acute surgical pathology.  Consider possibly passed kidney stone versus diverticulitis.  Will give symptomatic care for home.  Follow-up with PCP.  Return to the ED with worsening pain, fever, vomiting, other concerns.       Final Clinical Impression(s) / ED Diagnoses Final diagnoses:  Left lower quadrant abdominal pain    Rx / DC Orders ED Discharge Orders     None         Aerica Rincon, Annie Main, MD 01/25/23 1119

## 2023-01-25 NOTE — ED Notes (Signed)
Patient transported to Ultrasound 

## 2023-01-25 NOTE — ED Notes (Signed)
50 mL bladder scan

## 2023-01-26 ENCOUNTER — Other Ambulatory Visit (HOSPITAL_BASED_OUTPATIENT_CLINIC_OR_DEPARTMENT_OTHER): Payer: Self-pay | Admitting: Emergency Medicine

## 2023-01-26 ENCOUNTER — Other Ambulatory Visit (HOSPITAL_BASED_OUTPATIENT_CLINIC_OR_DEPARTMENT_OTHER): Payer: Self-pay | Admitting: Family Medicine

## 2023-01-27 ENCOUNTER — Encounter (HOSPITAL_BASED_OUTPATIENT_CLINIC_OR_DEPARTMENT_OTHER): Payer: Self-pay | Admitting: Pharmacist

## 2023-01-27 ENCOUNTER — Other Ambulatory Visit (HOSPITAL_BASED_OUTPATIENT_CLINIC_OR_DEPARTMENT_OTHER): Payer: Self-pay

## 2023-01-27 MED ORDER — TAMSULOSIN HCL 0.4 MG PO CAPS
0.4000 mg | ORAL_CAPSULE | Freq: Every day | ORAL | 2 refills | Status: DC
  Filled 2023-01-27: qty 30, 30d supply, fill #0
  Filled 2023-03-05: qty 30, 30d supply, fill #1
  Filled 2023-04-11 – 2023-04-19 (×2): qty 30, 30d supply, fill #2

## 2023-01-29 NOTE — Progress Notes (Signed)
Letter sent out 

## 2023-02-03 ENCOUNTER — Other Ambulatory Visit (HOSPITAL_BASED_OUTPATIENT_CLINIC_OR_DEPARTMENT_OTHER): Payer: Self-pay

## 2023-02-05 ENCOUNTER — Other Ambulatory Visit (HOSPITAL_BASED_OUTPATIENT_CLINIC_OR_DEPARTMENT_OTHER): Payer: Self-pay

## 2023-02-05 ENCOUNTER — Other Ambulatory Visit: Payer: Self-pay

## 2023-02-05 ENCOUNTER — Encounter (HOSPITAL_COMMUNITY): Payer: Self-pay | Admitting: Emergency Medicine

## 2023-02-05 ENCOUNTER — Emergency Department (HOSPITAL_COMMUNITY): Payer: Medicare Other

## 2023-02-05 ENCOUNTER — Emergency Department (HOSPITAL_COMMUNITY)
Admission: EM | Admit: 2023-02-05 | Discharge: 2023-02-05 | Disposition: A | Discharge status: Home / Self Care | Payer: Medicare Other | Attending: Emergency Medicine | Admitting: Emergency Medicine

## 2023-02-05 ENCOUNTER — Other Ambulatory Visit (HOSPITAL_BASED_OUTPATIENT_CLINIC_OR_DEPARTMENT_OTHER): Payer: Self-pay | Admitting: Emergency Medicine

## 2023-02-05 ENCOUNTER — Other Ambulatory Visit (HOSPITAL_BASED_OUTPATIENT_CLINIC_OR_DEPARTMENT_OTHER): Payer: Self-pay | Admitting: Family Medicine

## 2023-02-05 DIAGNOSIS — Z1152 Encounter for screening for COVID-19: Secondary | ICD-10-CM | POA: Insufficient documentation

## 2023-02-05 DIAGNOSIS — B349 Viral infection, unspecified: Secondary | ICD-10-CM | POA: Diagnosis not present

## 2023-02-05 DIAGNOSIS — R058 Other specified cough: Secondary | ICD-10-CM | POA: Diagnosis not present

## 2023-02-05 DIAGNOSIS — R1013 Epigastric pain: Secondary | ICD-10-CM | POA: Insufficient documentation

## 2023-02-05 LAB — CBC WITH DIFFERENTIAL/PLATELET
Abs Immature Granulocytes: 0.01 10*3/uL (ref 0.00–0.07)
Basophils Absolute: 0 10*3/uL (ref 0.0–0.1)
Basophils Relative: 1 %
Eosinophils Absolute: 0.1 10*3/uL (ref 0.0–0.5)
Eosinophils Relative: 3 %
HCT: 41.4 % (ref 39.0–52.0)
Hemoglobin: 14.6 g/dL (ref 13.0–17.0)
Immature Granulocytes: 0 %
Lymphocytes Relative: 38 %
Lymphs Abs: 1.4 10*3/uL (ref 0.7–4.0)
MCH: 30.9 pg (ref 26.0–34.0)
MCHC: 35.3 g/dL (ref 30.0–36.0)
MCV: 87.5 fL (ref 80.0–100.0)
Monocytes Absolute: 0.3 10*3/uL (ref 0.1–1.0)
Monocytes Relative: 8 %
Neutro Abs: 1.9 10*3/uL (ref 1.7–7.7)
Neutrophils Relative %: 50 %
Platelets: 176 10*3/uL (ref 150–400)
RBC: 4.73 MIL/uL (ref 4.22–5.81)
RDW: 11.9 % (ref 11.5–15.5)
WBC: 3.8 10*3/uL — ABNORMAL LOW (ref 4.0–10.5)
nRBC: 0 % (ref 0.0–0.2)

## 2023-02-05 LAB — RESP PANEL BY RT-PCR (RSV, FLU A&B, COVID)  RVPGX2
Influenza A by PCR: NEGATIVE
Influenza B by PCR: NEGATIVE
Resp Syncytial Virus by PCR: NEGATIVE
SARS Coronavirus 2 by RT PCR: NEGATIVE

## 2023-02-05 LAB — COMPREHENSIVE METABOLIC PANEL
ALT: 35 U/L (ref 0–44)
AST: 31 U/L (ref 15–41)
Albumin: 3.9 g/dL (ref 3.5–5.0)
Alkaline Phosphatase: 79 U/L (ref 38–126)
Anion gap: 11 (ref 5–15)
BUN: 6 mg/dL — ABNORMAL LOW (ref 8–23)
CO2: 21 mmol/L — ABNORMAL LOW (ref 22–32)
Calcium: 8.9 mg/dL (ref 8.9–10.3)
Chloride: 107 mmol/L (ref 98–111)
Creatinine, Ser: 0.9 mg/dL (ref 0.61–1.24)
GFR, Estimated: >60 mL/min (ref >60)
Glucose, Bld: 106 mg/dL — ABNORMAL HIGH (ref 70–99)
Potassium: 3.6 mmol/L (ref 3.5–5.1)
Sodium: 139 mmol/L (ref 135–145)
Total Bilirubin: 1.4 mg/dL — ABNORMAL HIGH (ref 0.3–1.2)
Total Protein: 6.9 g/dL (ref 6.5–8.1)

## 2023-02-05 LAB — TROPONIN I (HIGH SENSITIVITY): Troponin I (High Sensitivity): 7 ng/L (ref <18)

## 2023-02-05 LAB — LIPASE, BLOOD: Lipase: 26 U/L (ref 11–51)

## 2023-02-05 MED ORDER — BENZONATATE 100 MG PO CAPS
100.0000 mg | ORAL_CAPSULE | Freq: Three times a day (TID) | ORAL | 0 refills | Status: DC
  Filled 2023-02-05: qty 21, 7d supply, fill #0

## 2023-02-05 MED ORDER — ALUM & MAG HYDROXIDE-SIMETH 200-200-20 MG/5ML PO SUSP
30.0000 mL | Freq: Once | ORAL | Status: AC
  Administered 2023-02-05: 30 mL via ORAL
  Filled 2023-02-05: qty 30

## 2023-02-05 MED ORDER — ONDANSETRON 4 MG PO TBDP
ORAL_TABLET | ORAL | 0 refills | Status: DC
  Filled 2023-02-05: qty 20, 5d supply, fill #0

## 2023-02-05 MED ORDER — HYDROCOD POLI-CHLORPHE POLI ER 10-8 MG/5ML PO SUER
5.0000 mL | Freq: Once | ORAL | Status: AC
  Administered 2023-02-05: 5 mL via ORAL
  Filled 2023-02-05: qty 5

## 2023-02-05 NOTE — ED Triage Notes (Signed)
Pt reports abdominal pain and chest burning that started yesterday. Pt also reports n/v. Denies fevers.

## 2023-02-05 NOTE — Discharge Instructions (Signed)
Try pepcid or tagamet up to twice a day.  Try to avoid things that may make this worse, most commonly these are spicy foods tomato based products fatty foods chocolate and peppermint.  Alcohol and tobacco can also make this worse.  Return to the emergency department for sudden worsening pain fever or inability to eat or drink.  Please call your family doctor and let them know what happened.  Return to the emergency department for worsening symptoms difficulty breathing worsening discomfort inability eat or drink.

## 2023-02-05 NOTE — ED Provider Notes (Signed)
**Note Jorge-Identified via Obfuscation** [facility] Provider Note   CSN: [id number] Arrival date & time: 02/05/23  [id number]     History  No chief complaint on file.   Jorge [name] is a 72 y.o. male.  46 yoM with a chief complaints of epigastric pain.  This been going on for a few days now.  Patient has been coughing quite a bit with this as well.  Has been able to eat and drink without issue.  Some fevers off and on.  No known sick contacts.  No vomiting no diarrhea.        Home Medications Prior to Admission medications   Medication Sig Start Date End Date Taking? Authorizing Provider  benzonatate (TESSALON) 100 MG capsule Take 1 capsule (100 mg total) by mouth every 8 (eight) hours. 02/05/23  Yes Jorge Etienne, DO  ondansetron (ZOFRAN-ODT) 4 MG disintegrating tablet 39m ODT q4 hours prn nausea/vomit 02/05/23  Yes FDeno Etienne DO  atorvastatin (LIPITOR) 40 MG tablet Take 40 mg by mouth at bedtime.    [provider]  dicyclomine (BENTYL) 20 MG tablet Take 0.5 tablets (10 mg total) by mouth 2 (two) times daily for 7 days. 01/25/23 02/01/23  Rancour, SAnnie Main MD  gabapentin (NEURONTIN) 300 MG capsule Take 1 capsule (300 mg total) by mouth 3 (three) times daily. 01/05/23   Jorge [name] Jorge J, MD  KLOR-CON M20 20 MEQ tablet Take 20 mEq by mouth daily. 01/02/23   [provider]  lisinopril (ZESTRIL) 10 MG tablet Take 1 tablet (10 mg total) by mouth daily. 11/12/22   Jorge [name] Jorge J, MD  omeprazole (PRILOSEC) 40 MG capsule Take 1 capsule (40 mg total) by mouth daily. 11/12/22   Jorge [name] RBlondell Reveal MD  pantoprazole (PROTONIX) 40 MG tablet Take 1 tablet (40 mg total) by mouth 2 (two) times daily before a meal. 12/05/22   DVeryl Speak MD  sucralfate (CARAFATE) 1 g tablet Take 1 tablet (1 g total) by mouth with breakfast, with lunch, and with evening meal for 14 days. 11/08/22 11/22/22  Jorge Sparrow DO  tamsulosin (FLOMAX) 0.4 MG CAPS capsule Take 1 capsule (0.4 mg total) by  mouth daily. 01/27/23   Jorge [name] Jorge J, MD      Allergies    Patient has no known allergies.    Review of Systems   Review of Systems  Physical Exam Updated Vital Signs BP 130/81   Pulse (!) 49   Temp 97.6 F (36.4 C) (Oral)   Resp 12   SpO2 95%  Physical Exam Vitals and nursing note reviewed.  Constitutional:      Appearance: He is well-developed.  HENT:     Head: Normocephalic and atraumatic.  Eyes:     Pupils: Pupils are equal, round, and reactive to light.  Neck:     Vascular: No JVD.  Cardiovascular:     Rate and Rhythm: Normal rate and regular rhythm.     Heart sounds: No murmur heard.    No friction rub. No gallop.  Pulmonary:     Effort: No respiratory distress.     Breath sounds: No wheezing.  Abdominal:     General: There is no distension.     Tenderness: There is no abdominal tenderness. There is no guarding or rebound.     Comments: Mild upper abdominal discomfort without obvious focality.  Negative Murphy sign.  Musculoskeletal:        General: Normal range of motion.  Cervical back: Normal range of motion and neck supple.  Skin:    Coloration: Skin is not pale.     Findings: No rash.  Neurological:     Mental Status: He is alert and oriented to person, place, and time.  Psychiatric:        Behavior: Behavior normal.     ED Results / Procedures / Treatments   Labs (all labs ordered are listed, but only abnormal results are displayed) Labs Reviewed  CBC WITH DIFFERENTIAL/PLATELET - Abnormal; Notable for the following components:      Result Value   WBC 3.8 (*)    All other components within normal limits  COMPREHENSIVE METABOLIC PANEL - Abnormal; Notable for the following components:   CO2 21 (*)    Glucose, Bld 106 (*)    BUN 6 (*)    Total Bilirubin 1.4 (*)    All other components within normal limits  RESP PANEL BY RT-PCR (RSV, FLU A&B, COVID)  RVPGX2  LIPASE, BLOOD  TROPONIN I (HIGH SENSITIVITY)    EKG EKG  Interpretation  Date/Time:  Friday February 05 2023 09:30:21 EST Ventricular Rate:  57 PR Interval:  190 QRS Duration: 92 QT Interval:  408 QTC Calculation: 398 R Axis:   35 Text Interpretation: Sinus rhythm Abnormal R-wave progression, early transition Abnormal inferior Q waves No significant change since last tracing Confirmed by Jorge [name] [phone]) on 02/05/2023 9:51:36 AM  Radiology DG Chest Port 1 View  Result Date: 02/05/2023 CLINICAL DATA:  Cough and chest pain EXAM: PORTABLE CHEST 1 VIEW COMPARISON:  Chest radiograph dated 09/20/2022 FINDINGS: Normal lung volumes. No focal consolidations. No pleural effusion or pneumothorax. Similar cardiomediastinal silhouette. The visualized skeletal structures are unremarkable. IMPRESSION: No active disease. Electronically Signed   By: Darrin Nipper M.D.   On: 02/05/2023 09:56    Procedures Procedures    Medications Ordered in ED Medications  chlorpheniramine-HYDROcodone (TUSSIONEX) 10-8 MG/5ML suspension 5 mL (5 mLs Oral Given 02/05/23 0946)  alum & mag hydroxide-simeth (MAALOX/MYLANTA) 200-200-20 MG/5ML suspension 30 mL (30 mLs Oral Given 02/05/23 0946)    ED Course/ Medical Decision Making/ A&P                             Medical Decision Making Amount and/or Complexity of Data Reviewed Labs: ordered. Radiology: ordered.  Risk OTC drugs. Prescription drug management.   72 yo M with a chief complaints of cough congestion fever and epigastric pain.  Going on for a few days now.  [name] based on history.  Will obtain a chest x-ray blood work.  Reassess.  Chest x-ray independently interpreted by me without focal for pneumothorax.  Mild leukopenia, no significant electrolyte abnormality, LFTs and lipase unremarkable.  Troponin negative.  COVID flu and RSV negative.  Patient feeling better on repeat assessment.  Tolerating by mouth.  Will discharge home.  PCP follow-up.  12:54 PM:  I have discussed the  diagnosis/risks/treatment options with the patient and family.  Evaluation and diagnostic testing in the emergency department does not suggest an emergent condition requiring admission or immediate intervention beyond what has been performed at this time.  They will follow up with PCP. We also discussed returning to the ED immediately if new or worsening sx occur. We discussed the sx which are most concerning (e.g., sudden worsening pain, fever, inability to tolerate by mouth) that necessitate immediate return. Medications administered to the patient during their  visit and any new prescriptions provided to the patient are listed below.  Medications given during this visit Medications  chlorpheniramine-HYDROcodone (TUSSIONEX) 10-8 MG/5ML suspension 5 mL (5 mLs Oral Given 02/05/23 0946)  alum & mag hydroxide-simeth (MAALOX/MYLANTA) 200-200-20 MG/5ML suspension 30 mL (30 mLs Oral Given 02/05/23 0946)     The patient appears reasonably screen and/or stabilized for discharge and I doubt any other medical condition or other [facility] requiring further screening, evaluation, or treatment in the ED at this time prior to discharge.          Final Clinical Impression(s) / ED Diagnoses Final diagnoses:  Viral syndrome  Epigastric pain    Rx / DC Orders ED Discharge Orders          Ordered    benzonatate (TESSALON) 100 MG capsule  Every 8 hours        02/05/23 1252    ondansetron (ZOFRAN-ODT) 4 MG disintegrating tablet        02/05/23 [place]o, [place], DO 02/05/23 1254

## 2023-02-05 NOTE — ED Notes (Signed)
Pt ambulated to restroom with steady gait.

## 2023-02-06 ENCOUNTER — Other Ambulatory Visit (HOSPITAL_BASED_OUTPATIENT_CLINIC_OR_DEPARTMENT_OTHER): Payer: Self-pay | Admitting: Family Medicine

## 2023-02-06 ENCOUNTER — Other Ambulatory Visit (HOSPITAL_BASED_OUTPATIENT_CLINIC_OR_DEPARTMENT_OTHER): Payer: Self-pay

## 2023-02-07 ENCOUNTER — Other Ambulatory Visit: Payer: Self-pay

## 2023-02-07 ENCOUNTER — Emergency Department (HOSPITAL_COMMUNITY): Payer: Medicare Other

## 2023-02-07 ENCOUNTER — Encounter (HOSPITAL_COMMUNITY): Payer: Self-pay

## 2023-02-07 ENCOUNTER — Emergency Department (HOSPITAL_COMMUNITY)
Admission: EM | Admit: 2023-02-07 | Discharge: 2023-02-07 | Disposition: A | Discharge status: Home / Self Care | Payer: Medicare Other | Attending: Emergency Medicine | Admitting: Emergency Medicine

## 2023-02-07 DIAGNOSIS — N50811 Right testicular pain: Secondary | ICD-10-CM | POA: Diagnosis present

## 2023-02-07 DIAGNOSIS — N50812 Left testicular pain: Secondary | ICD-10-CM | POA: Insufficient documentation

## 2023-02-07 LAB — URINALYSIS, ROUTINE W REFLEX MICROSCOPIC
Bilirubin Urine: NEGATIVE
Glucose, UA: NEGATIVE mg/dL
Hgb urine dipstick: NEGATIVE
Ketones, ur: NEGATIVE mg/dL
Leukocytes,Ua: NEGATIVE
Nitrite: NEGATIVE
Protein, ur: NEGATIVE mg/dL
Specific Gravity, Urine: 1.005 (ref 1.005–1.030)
pH: 7 (ref 5.0–8.0)

## 2023-02-07 LAB — CBC WITH DIFFERENTIAL/PLATELET
Abs Immature Granulocytes: 0.01 10*3/uL (ref 0.00–0.07)
Basophils Absolute: 0 10*3/uL (ref 0.0–0.1)
Basophils Relative: 1 %
Eosinophils Absolute: 0.1 10*3/uL (ref 0.0–0.5)
Eosinophils Relative: 3 %
HCT: 41.4 % (ref 39.0–52.0)
Hemoglobin: 14.6 g/dL (ref 13.0–17.0)
Immature Granulocytes: 0 %
Lymphocytes Relative: 42 %
Lymphs Abs: 1.9 10*3/uL (ref 0.7–4.0)
MCH: 31 pg (ref 26.0–34.0)
MCHC: 35.3 g/dL (ref 30.0–36.0)
MCV: 87.9 fL (ref 80.0–100.0)
Monocytes Absolute: 0.3 10*3/uL (ref 0.1–1.0)
Monocytes Relative: 7 %
Neutro Abs: 2.1 10*3/uL (ref 1.7–7.7)
Neutrophils Relative %: 47 %
Platelets: 162 10*3/uL (ref 150–400)
RBC: 4.71 MIL/uL (ref 4.22–5.81)
RDW: 11.9 % (ref 11.5–15.5)
WBC: 4.5 10*3/uL (ref 4.0–10.5)
nRBC: 0 % (ref 0.0–0.2)

## 2023-02-07 LAB — COMPREHENSIVE METABOLIC PANEL
ALT: 32 U/L (ref 0–44)
AST: 33 U/L (ref 15–41)
Albumin: 3.8 g/dL (ref 3.5–5.0)
Alkaline Phosphatase: 80 U/L (ref 38–126)
Anion gap: 8 (ref 5–15)
BUN: 7 mg/dL — ABNORMAL LOW (ref 8–23)
CO2: 23 mmol/L (ref 22–32)
Calcium: 9 mg/dL (ref 8.9–10.3)
Chloride: 107 mmol/L (ref 98–111)
Creatinine, Ser: 0.95 mg/dL (ref 0.61–1.24)
GFR, Estimated: >60 mL/min (ref >60)
Glucose, Bld: 107 mg/dL — ABNORMAL HIGH (ref 70–99)
Potassium: 3.8 mmol/L (ref 3.5–5.1)
Sodium: 138 mmol/L (ref 135–145)
Total Bilirubin: 1.2 mg/dL (ref 0.3–1.2)
Total Protein: 7.2 g/dL (ref 6.5–8.1)

## 2023-02-07 LAB — LIPASE, BLOOD: Lipase: 27 U/L (ref 11–51)

## 2023-02-07 NOTE — Discharge Instructions (Signed)
At this time there does not appear to be the presence of an emergent medical condition, however there is always the potential for conditions to change. Please read and follow the below instructions.  Please return to the Emergency Department immediately for any new or worsening symptoms. Please be sure to follow up with your Primary Care Provider within one week regarding your visit today; please call their office to schedule an appointment even if you are feeling better for a follow-up visit. Please call the urologist Dr. Alyson Ingles for further evaluation of your pain.   Please read the additional information packets attached to your discharge summary.  Go to the nearest Emergency Department immediately if: You have fever or chills Your pain does not go away as soon as your doctor says it should. You cannot stop vomiting. Your pain is only in areas of your belly, such as the right side or the left lower part of the belly. You have bloody or black poop, or poop that looks like tar. You have very bad pain, cramping, or bloating in your belly. You have signs of not having enough fluid or water in your body (dehydration), such as: Dark pee, very little pee, or no pee. Cracked lips. Dry mouth. Sunken eyes. Sleepiness. Weakness. You have trouble breathing or chest pain. You have testicle swelling, blood in your urine, discharge from your penis or any additional concerns.  Do not take your medicine if  develop an itchy rash, swelling in your mouth or lips, or difficulty breathing; call 911 and seek immediate emergency medical attention if this occurs.  You may review your lab tests and imaging results in their entirety on your MyChart account.  Please discuss all results of fully with your primary care provider and other specialist at your follow-up visit.  Note: Portions of this text may have been transcribed using voice recognition software. Every effort was made to ensure accuracy; however,  inadvertent computerized transcription errors may still be present. ===================== Below has been translated using Google translate.  Errors may be present.  Interpret with caution.  ?? ???? ?????? ?????? ???? ?????? ?????? ?? ????????? ??????? ??? ??????? ????????????? ???????? ?????????? ====================== ?? ????? ?????? ??????? ???????? ???????? ???????? ???? ??????, ?? ???????????? ???????? ???? ???????? ???? ?????? ????? ????????? ? ???? ??????????? ????? ??????????  1. ???? ??? ???? ?? ???????? ???? ?????????? ???? ????? ???????? ??????? ???????? ??????????? 2. ????? ??????? ???? ??????? ????????? ?? ????? ????? ????? ???????? ?????? ?????????? ????? ???? ??????? ????????; ??? ????? ???-?? ??????? ???? ?????? ????? ????? ????????? ??? ??? ???????? ??? ?????? ????? ????? ???????? ?????????? ?? ?????????? 3. ????? ??????? ??????? ?? ???????????? ???? ??????????? ??. ????????????? ?? ??????????   ????? ??????? ????????? ???????? ?????? ???????? ??????? ?????????? ??????????  ???????? ?????? ???????? ??????? ???????? ???: ? ??????? ????? ?? ???? ? ? ?????? ????? ?????? ???????? ???????????? ????? ??????? ? ????? ????? ????? ??????????? ? ??????? ????? ??????? ??? ?? ?????????? ?? ????? ?, ????? ?????? ??? ?? ??? ?? ????? ????? ???? ? ???????? ??? ?? ???? ?? ?, ?? ??? ????? ???????? ? ?????? ????? ???? ??????? ?????, ????????????, ?? ?????? ?????? ?? ? ??????? ?????? ???????? ??? ?????? ?? ???? ????? ???????? ??? (??????????), ?????: ? ???? ?????, ???? ???? ?????, ?? ????? ???? ? ?????? ??? ? ?????? ???? ? ?????? ???? ? ? ??????? ? ??????? ? ??????? ??? ????? ?????? ? ?? ???? ????? ?? ? ??????? ??????? ????????? ?, ??????? ??????? ??? ?, ??????? ??????? ????????? ?? ???? ???????? ?????? ??  ??????? ???, ??? ?? ???? ????????, ?? ??? ????? ?????? ???? ???? ?????????;  911 ?? ?? ????????? ? ??? ?? ??? ??? ?????? ???????? ???????? ????? ???????????  ????? ??????? MyChart  ?????? ??????? ?????????? ??????? ? ?????? ??????????? ???????????? ??????? ???? ??????????? ????? ????? ???????? ?????? ??????? ? ??????? ???-?? ??????? ???? ?????????????? ????? ????? ??? ????????? ???? ??????????  ???: ?? ????? ?????? ???? ?????? ???????? ?????? ???? ????????????? ?????? ??? ????? ??????? ????????? ???? ???? ?????? ?????? ????; ??????, ??????? ????????????? ??????????????? ????????? ??? ??? ??????? ??? ???????

## 2023-02-07 NOTE — ED Provider Notes (Signed)
[facility] Provider Note   CSN: [id number] Arrival date & time: 02/07/23  0801     History  Chief Complaint  Patient presents with   Groin Pain   Nepalese interpreter used during this visit. Jorge [name] is a 72 y.o. male presented to the emergency department for evaluation of bilateral testicle pain and abdominal pain.  Patient reports bilateral testicle pain that has been an intermittent issue for over 1 year, he describes pain as severe and worsening over the past day, no clear inciting event, pain will radiate to lower abdomen, no alleviating or aggravating factors. Patient also reports lower abdominal pain that is been intermittent issue for over 7 years.  This is also worsened over the past few days, no clear inciting event. Worsened with palpation, no alleviating factors.  Patient denies penile discharge, dysuria/hematuria, testicular swelling, injury, nausea, vomiting, diarrhea, fever or any additional concerns.  HPI     Home Medications Prior to Admission medications   Medication Sig Start Date End Date Taking? Authorizing Provider  atorvastatin (LIPITOR) 40 MG tablet Take 40 mg by mouth at bedtime.    [provider]  benzonatate (TESSALON) 100 MG capsule Take 1 capsule (100 mg total) by mouth every 8 (eight) hours. 02/05/23   Deno Etienne, DO  dicyclomine (BENTYL) 20 MG tablet Take 0.5 tablets (10 mg total) by mouth 2 (two) times daily for 7 days. 01/25/23 02/01/23  Rancour, Annie Main, MD  gabapentin (NEURONTIN) 300 MG capsule Take 1 capsule (300 mg total) by mouth 3 (three) times daily. 01/05/23   de [place], Raymond J, MD  KLOR-CON M20 20 MEQ tablet Take 20 mEq by mouth daily. 01/02/23   [provider]  lisinopril (ZESTRIL) 10 MG tablet Take 1 tablet (10 mg total) by mouth daily. 11/12/22   de [place], Raymond J, MD  omeprazole (PRILOSEC) 40 MG capsule Take 1 capsule (40 mg total) by mouth daily. 11/12/22   de [place],  Blondell Reveal, MD  ondansetron (ZOFRAN-ODT) 4 MG disintegrating tablet Dissolve 1 tablet under the tongue every 4 hours as needed for nausea/vomit 02/05/23   Deno Etienne, DO  pantoprazole (PROTONIX) 40 MG tablet Take 1 tablet (40 mg total) by mouth 2 (two) times daily before a meal. 12/05/22   Veryl Speak, MD  sucralfate (CARAFATE) 1 g tablet Take 1 tablet (1 g total) by mouth with breakfast, with lunch, and with evening meal for 14 days. 11/08/22 11/22/22  Jeanell Sparrow, DO  tamsulosin (FLOMAX) 0.4 MG CAPS capsule Take 1 capsule (0.4 mg total) by mouth daily. 01/27/23   de [place], Raymond J, MD      Allergies    Patient has no known allergies.    Review of Systems   Review of Systems Ten systems are reviewed and are negative for acute change except as noted in the HPI  Physical Exam Updated Vital Signs BP (!) 148/72 (BP Location: Right Arm)   Pulse 60   Temp 97.8 F (36.6 C) (Oral)   Resp 16   SpO2 98%  Physical Exam Constitutional:      General: He is not in acute distress.    Appearance: Normal appearance. He is well-developed. He is not ill-appearing or diaphoretic.  HENT:     Head: Normocephalic and atraumatic.  Eyes:     General: Vision grossly intact. Gaze aligned appropriately.     Pupils: Pupils are equal, round, and reactive to light.  Neck:  Trachea: Trachea and phonation normal.  Cardiovascular:     Pulses:          Dorsalis pedis pulses are 2+ on the right side and 2+ on the left side.  Pulmonary:     Effort: Pulmonary effort is normal. No respiratory distress.  Abdominal:     General: There is no distension.     Palpations: Abdomen is soft.     Tenderness: There is abdominal tenderness in the suprapubic area. There is no guarding or rebound.  Genitourinary:    Comments: Chaperone present during genital exam Chole RN and Ally PA-S.  No external genital lesions noted, no bumps on head of penis, specifically no vesicles concerning for herpes or chancre suggestive of  syphilis.  TTP to palpation of bilateral testes. No testicular swelling. No pain with palpation of the penis/glans, no discharge or urethritis noted.  Cremasteric reflex intact bilaterally. No palpable hernia noted.    Musculoskeletal:        General: Normal range of motion.     Cervical back: Normal range of motion.  Feet:     Right foot:     Protective Sensation: 3 sites tested.  3 sites sensed.     Left foot:     Protective Sensation: 3 sites tested.  3 sites sensed.  Skin:    General: Skin is warm and dry.  Neurological:     Mental Status: He is alert.     GCS: GCS eye subscore is 4. GCS verbal subscore is 5. GCS motor subscore is 6.     Comments: Speech is clear and goal oriented, follows commands Major Cranial nerves without deficit, no facial droop Moves extremities without ataxia, coordination intact  Psychiatric:        Behavior: Behavior normal.     ED Results / Procedures / Treatments   Labs (all labs ordered are listed, but only abnormal results are displayed) Labs Reviewed  URINALYSIS, ROUTINE W REFLEX MICROSCOPIC - Abnormal; Notable for the following components:      Result Value   Color, Urine STRAW (*)    All other components within normal limits  COMPREHENSIVE METABOLIC PANEL - Abnormal; Notable for the following components:   Glucose, Bld 107 (*)    BUN 7 (*)    All other components within normal limits  CBC WITH DIFFERENTIAL/PLATELET  LIPASE, BLOOD    EKG None  Radiology US SCROTUM W/DOPPLER  Result Date: 02/07/2023 CLINICAL DATA:  Recurrent testicular pain. EXAM: SCROTAL ULTRASOUND DOPPLER ULTRASOUND OF THE TESTICLES TECHNIQUE: Complete ultrasound examination of the testicles, epididymis, and other scrotal structures was performed. Color and spectral Doppler ultrasound were also utilized to evaluate blood flow to the testicles. COMPARISON:  01/25/2023 FINDINGS: Right testicle Measurements: 4.4 x 2.6 x 3.3 cm. No mass or microlithiasis visualized.  Left testicle Measurements: 4.0 x 2.8 x 2.3 cm. No mass or microlithiasis visualized. Right epididymis:  Normal in size and appearance. Left epididymis:  Normal in size and appearance. Hydrocele: Small volume simple fluid in each hemiscrotum, likely physiologic. Varicocele:  None visualized. Pulsed Doppler interrogation of both testes demonstrates normal low resistance arterial and venous waveforms bilaterally. IMPRESSION: Unremarkable scrotal ultrasound. Electronically Signed   By: Misty Stanley M.D.   On: 02/07/2023 09:28    Procedures Procedures    Medications Ordered in ED Medications - No data to display  ED Course/ Medical Decision Making/ A&P  Medical Decision Making 72 year old male presented today for evaluation of chronic testicular and abdominal pain.  On exam he is in no acute distress, vital signs are stable.  Patient is mildly tender to the bilateral testes, there is no swelling or overlying skin changes.  He is also minimally tender to the suprapubic area, no rebound or peritoneal signs.  Differential includes but not limited to epididymitis, orchitis, UTI, kidney stone disease, cystitis.  Amount and/or Complexity of Data Reviewed External Data Reviewed: notes.    Details: I reviewed patient's recent urology note on January 08, 2023.  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  He was reporting intermittent dysuria for 2 to 3 months.  They were discussing observation versus transurethral resection and the patient elected for a resection which was scheduled on February 18, 2023. Labs: ordered.    Details: CBC within normal limits, no leukocytosis to suggest infectious process, no anemia or thrombocytopenia. Lipase normal limits, doubt pancreatitis. CMP shows no emergent electrolyte derangement, AKI, LFT elevations or gap. Urinalysis without hematuria to suggest stone disease.  No evidence  for infection. Radiology: ordered.    Details: Scrotal ultrasound with Doppler ordered.  Radiologist interpretation is unremarkable.  Risk Risk Details: Patient's workup today overall reassuring.  Patient was reexamined he is resting comfortably in bed no acute distress.  He has been walking on the hallway without assistance or difficulty.  He reports his pain is improved.  I reviewed above test with patient and his son who is at bedside they both stated understanding.  The iPad translator was used during initial evaluation and history taking, I offered to use this again to discuss above and patient declined and had his son translate instead.  I recommended that they call their urologist Dr. Alyson Ingles to schedule follow-up appointment this week.  I discussed strict ER precautions with them today and they stated understanding.  Low suspicion for torsion, epididymitis, orchitis, kidney stone disease, appendicitis, UTI or other emergent causes of patient's chronic testicle pain at this time.  He appears stable for discharge and close outpatient follow-up with urology and PCP.   At this time there does not appear to be any evidence of an acute emergency medical condition and the patient appears stable for discharge with appropriate outpatient follow up. Diagnosis was discussed with patient who verbalizes understanding of care plan and is agreeable to discharge. I have discussed return precautions with patient and his son who verbalizes understanding. Patient encouraged to follow-up with their PCP and urology. All questions answered.  Patient's case discussed with Dr. Sherry Ruffing who agrees with plan to discharge with follow-up.   Note: Portions of this report may have been transcribed using voice recognition software. Every effort was made to ensure accuracy; however, inadvertent computerized transcription errors may still be present.         Final Clinical Impression(s) / ED Diagnoses Final diagnoses:   Pain in both testicles    Rx / DC Orders ED Discharge Orders     None         Gari Crown 02/07/23 1120    Tegeler, Gwenyth Allegra, MD 02/07/23 1304

## 2023-02-07 NOTE — ED Triage Notes (Signed)
Pt came in POV d/t groin pain. Pt reported it hurts sometimes when he urinates. Today the pain is worse. Rated it 9/10. A&O X4.

## 2023-02-08 ENCOUNTER — Other Ambulatory Visit (HOSPITAL_BASED_OUTPATIENT_CLINIC_OR_DEPARTMENT_OTHER): Payer: Self-pay

## 2023-02-08 MED ORDER — OMEPRAZOLE 40 MG PO CPDR
40.0000 mg | DELAYED_RELEASE_CAPSULE | Freq: Every day | ORAL | 0 refills | Status: DC
  Filled 2023-02-08: qty 90, 90d supply, fill #0

## 2023-02-12 NOTE — Patient Instructions (Addendum)
Jorge [name]  02/12/2023     @[email or web address]$ @   Your procedure is scheduled on 02/18/2023.  Report to [facility] at 8:00 A.M.  Call this number if you have problems the morning of surgery:  [phone]  If you experience any cold or flu symptoms such as cough, fever, chills, shortness of breath, etc. between now and your scheduled surgery, please notify us at the above number.   Remember:  Do not eat or drink after midnight.   Take these medicines the morning of surgery with A SIP OF WATER : Neurontin Prilosec Flomax    Do not wear jewelry, make-up or nail polish.  Do not wear lotions, powders, or perfumes, or deodorant.  Do not shave 48 hours prior to surgery.  Men may shave face and neck.  Do not bring valuables to the hospital.  [facility] is not responsible for any belongings or valuables.  Contacts, dentures or bridgework may not be worn into surgery.  Leave your suitcase in the car.  After surgery it may be brought to your room.  For patients admitted to the hospital, discharge time will be determined by your treatment team.  Patients discharged the day of surgery will not be allowed to drive home.   Name and phone number of your driver:   Family Special instructions:  N/A  Please read over the following fact sheets that you were given. Care and Recovery After Surgery        Transurethral Resection of Bladder Tumor  Transurethral resection of a bladder tumor is the removal (resection) of cancerous tissue (tumor) from the inside wall of the bladder. The bladder is the organ that holds urine. The tumor is removed through the tube that carries urine out of the body (urethra). In a transurethral resection, a thin telescope with a light, a tiny camera, and an electric cutting edge (resectoscope) is passed through the urethra. In men, the opening of the urethra is at the end of the penis. In women, it is just above the opening of the vagina. Tell a health care  provider about: Any allergies you have. All medicines you are taking, including vitamins, herbs, eye drops, creams, and over-the-counter medicines. Any problems you or family members have had with anesthetic medicines. Any bleeding problems you have. Any surgeries you have had. Any medical conditions you have, including recent urinary tract infections. Whether you are pregnant or may be pregnant. What are the risks? Generally, this is a safe procedure. However, problems may occur, including: Infection. Bleeding. Allergic reactions to medicines. Damage to nearby structures or organs. Difficulty urinating from blockage of the urethra or not being able to urinate (urinary retention). Deep vein thrombosis. This is a blood clot that can develop in your leg. Recurring cancer. What happens before the procedure? When to stop eating and drinking Follow instructions from your health care provider about what you may eat and drink before your procedure. These may include: 8 hours before your procedure Stop eating most foods. Do not eat meat, fried foods, or fatty foods. Eat only light foods, such as toast or crackers. All liquids are okay except energy drinks and alcohol. 6 hours before your procedure Stop eating. Drink only clear liquids, such as water, clear fruit juice, black coffee, plain tea, and sports drinks. Do not drink energy drinks or alcohol. 2 hours before your procedure Stop drinking all liquids. You may be allowed to take medicines with small sips of water. Medicines Ask your health  care provider about: Changing or stopping your regular medicines. This is especially important if you are taking diabetes medicines or blood thinners. Taking medicines such as aspirin and ibuprofen. These medicines can thin your blood. Do not take these medicines unless your health care provider tells you to take them. Taking over-the-counter medicines, vitamins, herbs, and supplements. General  instructions If you will be going home right after the procedure, plan to have a responsible adult: Take you home from the hospital or clinic. You will not be allowed to drive. Care for you for the time you are told. Ask your health care provider what steps will be taken to help prevent infection. These steps may include: Washing skin with a germ-killing soap. Taking antibiotic medicine. Do not use any products that contain nicotine or tobacco for at least 4 weeks before the procedure. These products include cigarettes, chewing tobacco, and vaping devices, such as e-cigarettes. If you need help quitting, ask your health care provider. What happens during the procedure? An IV will be inserted into one of your veins. You will be given one or more of the following: A medicine to help you relax (sedative). A medicine that is injected into your spine to numb the area below and slightly above the injection site (spinal anesthetic). A medicine that is injected into an area of your body to numb everything below the injection site (regional anesthetic). A medicine to make you fall asleep (general anesthetic). Your legs will be placed in foot rests (stirrups) to open your legs and bend your knees. The resectoscope will be passed through your urethra and into your bladder. The part of your bladder with the tumor will be resected by the cutting edge of the resectoscope. Fluid will be passed to rinse out the cut tissues (irrigation). The resectoscope will then be taken out. A small, thin tube (catheter) will be passed through your urethra and into your bladder. The catheter will drain urine into a bag outside of your body. The procedure may vary among health care providers and hospitals. What happens after the procedure? Your blood pressure, heart rate, breathing rate, and blood oxygen level will be monitored until you leave the hospital or clinic. You may continue to receive fluids and medicines through  an IV. You will be given pain medicine to relieve pain. You will have a catheter to drain your urine. The amount of urine will be measured. If you have blood in your urine, your bladder may be rinsed out by passing fluid through your catheter. You will be encouraged to walk as soon as you can. You may have to wear compression stockings. These stockings help to prevent blood clots and reduce swelling in your legs. If you were given a sedative during the procedure, it can affect you for several hours. Do not drive or operate machinery until your health care provider says that it is safe. Summary Transurethral resection of a bladder tumor is the removal (resection) of a cancerous growth (tumor) on the inside wall of the bladder. To do this procedure, your health care provider uses a thin telescope with a light, a tiny camera, and an electric cutting edge (resectoscope) that is guided to your bladder through your urethra. The part of your bladder that is affected by the tumor will be resected by the cutting edge of the resectoscope. A catheter will be passed through your urethra and into your bladder. The catheter will drain urine into a bag outside of your body. If you will  be going home right after the procedure, plan to have a responsible adult take you home from the hospital or clinic. You will not be allowed to drive. This information is not intended to replace advice given to you by your health care provider. Make sure you discuss any questions you have with your health care provider. Document Revised: 12/19/2021 Document Reviewed: 12/19/2021 Elsevier Patient Education  [place] Anesthesia, Adult General anesthesia is the use of medicine to make you fall asleep (unconscious) for a medical procedure. General anesthesia must be used for certain procedures. It is often recommended for surgery or procedures that: Last a long time. Require you to be still or in an unusual  position. Are major and can cause blood loss. Affect your breathing. The medicines used for general anesthesia are called general anesthetics. During general anesthesia, these medicines are given along with medicines that: Prevent pain. Control your blood pressure. Relax your muscles. Prevent nausea and vomiting after the procedure. Tell a health care provider about: Any allergies you have. All medicines you are taking, including vitamins, herbs, eye drops, creams, and over-the-counter medicines. Your history of any: Medical conditions you have, including: High blood pressure. Bleeding problems. Diabetes. Heart or lung conditions, such as: Heart failure. Sleep apnea. Asthma. Chronic obstructive pulmonary disease (COPD). Current or recent illnesses, such as: Upper respiratory, chest, or ear infections. Cough or fever. Tobacco or drug use, including marijuana or alcohol use. Depression or anxiety. Surgeries and types of anesthetics you have had. Problems you or family members have had with anesthetic medicines. Whether you are pregnant or may be pregnant. Whether you have any chipped or loose teeth, dentures, caps, bridgework, or issues with your mouth, swallowing, or choking. What are the risks? Your health care provider will talk with you about risks. These may include: Allergic reaction to the medicines. Lung and heart problems. Inhaling food or liquid from the stomach into the lungs (aspiration). Nerve injury. Injury to the lips, mouth, teeth, or gums. Stroke. Waking up during your procedure and being unable to move. This is rare. These problems are more likely to develop if you are having a major surgery or if you have an advanced or serious medical condition. You can prevent some of these complications by answering all of your health care provider's questions thoroughly and by following all instructions before your procedure. General anesthesia can cause side effects,  including: Nausea or vomiting. A sore throat or hoarseness from the breathing tube. Wheezing or coughing. Shaking chills or feeling cold. Body aches. Sleepiness. Confusion, agitation (delirium), or anxiety. What happens before the procedure? When to stop eating and drinking Follow instructions from your health care provider about what you may eat and drink before your procedure. If you do not follow your health care provider's instructions, your procedure may be delayed or canceled. Medicines Ask your health care provider about: Changing or stopping your regular medicines. These include any diabetes medicines or blood thinners you take. Taking medicines such as aspirin and ibuprofen. These medicines can thin your blood. Do not take them unless your health care provider tells you to. Taking over-the-counter medicines, vitamins, herbs, and supplements. General instructions Do not use any products that contain nicotine or tobacco for at least 4 weeks before the procedure. These products include cigarettes, chewing tobacco, and vaping devices, such as e-cigarettes. If you need help quitting, ask your health care provider. If you brush your teeth on the morning of the procedure, make sure to spit  out all of the water and toothpaste. If told by your health care provider, bring your sleep apnea device with you to surgery (if applicable). If you will be going home right after the procedure, plan to have a responsible adult: Take you home from the hospital or clinic. You will not be allowed to drive. Care for you for the time you are told. What happens during the procedure?  An IV will be inserted into one of your veins. You will be given one or more of the following through a face mask or IV: A sedative. This helps you relax. Anesthesia. This will: Numb certain areas of your body. Make you fall asleep for surgery. After you are unconscious, a breathing tube may be inserted down your throat to  help you breathe. This will be removed before you wake up. An anesthesia provider, such as an anesthesiologist, will stay with you throughout your procedure. The anesthesia provider will: Keep you comfortable and safe by continuing to give you medicines and adjusting the amount of medicine that you get. Monitor your blood pressure, heart rate, and oxygen levels to make sure that the anesthetics do not cause any problems. The procedure may vary among health care providers and hospitals. What happens after the procedure? Your blood pressure, temperature, heart rate, breathing rate, and blood oxygen level will be monitored until you leave the hospital or clinic. You will wake up in a recovery area. You may wake up slowly. You may be given medicine to help you with pain, nausea, or any other side effects from the anesthesia. Summary General anesthesia is the use of medicine to make you fall asleep (unconscious) for a medical procedure. Follow your health care provider's instructions about when to stop eating, drinking, or taking certain medicines before your procedure. Plan to have a responsible adult take you home from the hospital or clinic. This information is not intended to replace advice given to you by your health care provider. Make sure you discuss any questions you have with your health care provider. Document Revised: 03/12/2022 Document Reviewed: 03/12/2022 Elsevier Patient Education  [place].  How to Use Chlorhexidine Before Surgery Chlorhexidine gluconate (CHG) is a germ-killing (antiseptic) solution that is used to clean the skin. It can get rid of the bacteria that normally live on the skin and can keep them away for about 24 hours. To clean your skin with CHG, you may be given: A CHG solution to use in the shower or as part of a sponge bath. A prepackaged cloth that contains CHG. Cleaning your skin with CHG may help lower the risk for infection: While you are staying in  the intensive care unit of the hospital. If you have a vascular access, such as a central line, to provide short-term or long-term access to your veins. If you have a catheter to drain urine from your bladder. If you are on a ventilator. A ventilator is a machine that helps you breathe by moving air in and out of your lungs. After surgery. What are the risks? Risks of using CHG include: A skin reaction. Hearing loss, if CHG gets in your ears and you have a perforated eardrum. Eye injury, if CHG gets in your eyes and is not rinsed out. The CHG product catching fire. Make sure that you avoid smoking and flames after applying CHG to your skin. Do not use CHG: If you have a chlorhexidine allergy or have previously reacted to chlorhexidine. On babies younger than 2  months of age. How to use CHG solution Use CHG only as told by your health care provider, and follow the instructions on the label. Use the full amount of CHG as directed. Usually, this is one bottle. During a shower Follow these steps when using CHG solution during a shower (unless your health care provider gives you different instructions): Start the shower. Use your normal soap and shampoo to wash your face and hair. Turn off the shower or move out of the shower stream. Pour the CHG onto a clean washcloth. Do not use any type of brush or rough-edged sponge. Starting at your neck, lather your body down to your toes. Make sure you follow these instructions: If you will be having surgery, pay special attention to the part of your body where you will be having surgery. Scrub this area for at least 1 minute. Do not use CHG on your head or face. If the solution gets into your ears or eyes, rinse them well with water. Avoid your genital area. Avoid any areas of skin that have broken skin, cuts, or scrapes. Scrub your back and under your arms. Make sure to wash skin folds. Let the lather sit on your skin for 1-2 minutes or as long as  told by your health care provider. Thoroughly rinse your entire body in the shower. Make sure that all body creases and crevices are rinsed well. Dry off with a clean towel. Do not put any substances on your body afterward--such as powder, lotion, or perfume--unless you are told to do so by your health care provider. Only use lotions that are recommended by the manufacturer. Put on clean clothes or pajamas. If it is the night before your surgery, sleep in clean sheets.  During a sponge bath Follow these steps when using CHG solution during a sponge bath (unless your health care provider gives you different instructions): Use your normal soap and shampoo to wash your face and hair. Pour the CHG onto a clean washcloth. Starting at your neck, lather your body down to your toes. Make sure you follow these instructions: If you will be having surgery, pay special attention to the part of your body where you will be having surgery. Scrub this area for at least 1 minute. Do not use CHG on your head or face. If the solution gets into your ears or eyes, rinse them well with water. Avoid your genital area. Avoid any areas of skin that have broken skin, cuts, or scrapes. Scrub your back and under your arms. Make sure to wash skin folds. Let the lather sit on your skin for 1-2 minutes or as long as told by your health care provider. Using a different clean, wet washcloth, thoroughly rinse your entire body. Make sure that all body creases and crevices are rinsed well. Dry off with a clean towel. Do not put any substances on your body afterward--such as powder, lotion, or perfume--unless you are told to do so by your health care provider. Only use lotions that are recommended by the manufacturer. Put on clean clothes or pajamas. If it is the night before your surgery, sleep in clean sheets. How to use CHG prepackaged cloths Only use CHG cloths as told by your health care provider, and follow the instructions  on the label. Use the CHG cloth on clean, dry skin. Do not use the CHG cloth on your head or face unless your health care provider tells you to. When washing with the CHG cloth: Avoid  your genital area. Avoid any areas of skin that have broken skin, cuts, or scrapes. Before surgery Follow these steps when using a CHG cloth to clean before surgery (unless your health care provider gives you different instructions): Using the CHG cloth, vigorously scrub the part of your body where you will be having surgery. Scrub using a back-and-forth motion for 3 minutes. The area on your body should be completely wet with CHG when you are done scrubbing. Do not rinse. Discard the cloth and let the area air-dry. Do not put any substances on the area afterward, such as powder, lotion, or perfume. Put on clean clothes or pajamas. If it is the night before your surgery, sleep in clean sheets.  For general bathing Follow these steps when using CHG cloths for general bathing (unless your health care provider gives you different instructions). Use a separate CHG cloth for each area of your body. Make sure you wash between any folds of skin and between your fingers and toes. Wash your body in the following order, switching to a new cloth after each step: The front of your neck, shoulders, and chest. Both of your arms, under your arms, and your hands. Your stomach and groin area, avoiding the genitals. Your right leg and foot. Your left leg and foot. The back of your neck, your back, and your buttocks. Do not rinse. Discard the cloth and let the area air-dry. Do not put any substances on your body afterward--such as powder, lotion, or perfume--unless you are told to do so by your health care provider. Only use lotions that are recommended by the manufacturer. Put on clean clothes or pajamas. Contact a health care provider if: Your skin gets irritated after scrubbing. You have questions about using your solution or  cloth. You swallow any chlorhexidine. Call your local poison control center (1-727 701 2190 in the [place] Get help right away if: Your eyes itch badly, or they become very red or swollen. Your skin itches badly and is red or swollen. Your hearing changes. You have trouble seeing. You have swelling or tingling in your mouth or throat. You have trouble breathing. These symptoms may represent a serious problem that is an emergency. Do not wait to see if the symptoms will go away. Get medical help right away. Call your local emergency services (911 in the [place] Do not drive yourself to the hospital. Summary Chlorhexidine gluconate (CHG) is a germ-killing (antiseptic) solution that is used to clean the skin. Cleaning your skin with CHG may help to lower your risk for infection. You may be given CHG to use for bathing. It may be in a bottle or in a prepackaged cloth to use on your skin. Carefully follow your health care provider's instructions and the instructions on the product label. Do not use CHG if you have a chlorhexidine allergy. Contact your health care provider if your skin gets irritated after scrubbing. This information is not intended to replace advice given to you by your health care provider. Make sure you discuss any questions you have with your health care provider. Document Revised: 04/13/2022 Document Reviewed: 02/24/2021 Elsevier Patient Education  [place].

## 2023-02-15 ENCOUNTER — Encounter (HOSPITAL_BASED_OUTPATIENT_CLINIC_OR_DEPARTMENT_OTHER): Payer: Self-pay | Admitting: Family Medicine

## 2023-02-15 ENCOUNTER — Ambulatory Visit (INDEPENDENT_AMBULATORY_CARE_PROVIDER_SITE_OTHER): Payer: Medicare Other | Admitting: Family Medicine

## 2023-02-15 ENCOUNTER — Encounter (HOSPITAL_COMMUNITY): Payer: Self-pay

## 2023-02-15 ENCOUNTER — Encounter (HOSPITAL_COMMUNITY)
Admission: RE | Admit: 2023-02-15 | Discharge: 2023-02-15 | Disposition: A | Discharge status: Home / Self Care | Payer: Medicare Other | Source: Ambulatory Visit | Attending: Urology

## 2023-02-15 VITALS — BP 153/73 | HR 65 | Temp 97.8°F | Resp 18 | Ht 62.0 in | Wt 185.0 lb

## 2023-02-15 VITALS — BP 137/70 | HR 63 | Ht 62.0 in | Wt 181.0 lb

## 2023-02-15 DIAGNOSIS — G8929 Other chronic pain: Secondary | ICD-10-CM

## 2023-02-15 DIAGNOSIS — I10 Essential (primary) hypertension: Secondary | ICD-10-CM | POA: Insufficient documentation

## 2023-02-15 DIAGNOSIS — R9431 Abnormal electrocardiogram [ECG] [EKG]: Secondary | ICD-10-CM | POA: Diagnosis not present

## 2023-02-15 DIAGNOSIS — R109 Unspecified abdominal pain: Secondary | ICD-10-CM | POA: Diagnosis not present

## 2023-02-15 DIAGNOSIS — Z23 Encounter for immunization: Secondary | ICD-10-CM

## 2023-02-15 DIAGNOSIS — Z01810 Encounter for preprocedural cardiovascular examination: Secondary | ICD-10-CM | POA: Diagnosis present

## 2023-02-15 NOTE — Progress Notes (Signed)
Established Patient Office Visit  Subjective   Patient ID: Jorge [name], male    DOB: 1951/07/30  Age: 72 y.o. MRN: [id number]  Chief Complaint  Patient presents with   Follow-up    Pt here for f/u on chronic abdominal pain   Son present acting as interpreter for this visit.   HPI  Follow-up on abdominal pain.  Seen by PCP on 11/12/22 for abdominal pain.  ED visits for abdominal pain: 10/18/22 11/08/22 12/05/22 01/25/23 Recent CT scans of abdomen/pelvis:  11/08/22 CT scan abdominal : no acute findings.  12/05/22: CT scan abdominal : no acute findings.    Abdominal pain started 9 years ago. Pain moves to different area. Unsure of last colonoscopy date. Will refer today.   Review of Systems  Constitutional:  Negative for chills and fever.  Respiratory:  Negative for shortness of breath.   Cardiovascular:  Negative for chest pain.  Gastrointestinal:  Positive for abdominal pain (chronic). Negative for blood in stool, constipation, diarrhea, melena, nausea and vomiting.      Objective:     BP 137/70 (BP Location: Left Arm, Patient Position: Sitting, Cuff Size: Normal)   Pulse 63   Ht 5' 2"$  (1.575 m)   Wt 181 lb (82.1 kg)   SpO2 97%   BMI 33.11 kg/m  BP Readings from Last 3 Encounters:  02/15/23 137/70  02/15/23 (!) 153/73  02/07/23 (!) 148/72      Physical Exam Vitals and nursing note reviewed.  Constitutional:      General: He is not in acute distress.    Appearance: Normal appearance.  Cardiovascular:     Rate and Rhythm: Regular rhythm.     Heart sounds: Normal heart sounds.  Pulmonary:     Effort: Pulmonary effort is normal.     Breath sounds: Normal breath sounds.  Abdominal:     General: Bowel sounds are normal. There is no distension.     Palpations: Abdomen is soft.     Tenderness: There is abdominal tenderness (generalized.). There is no guarding or rebound.  Skin:    General: Skin is warm and dry.     Capillary Refill: Capillary refill  takes less than 2 seconds.  Neurological:     General: No focal deficit present.     Mental Status: He is alert. Mental status is at baseline.  Psychiatric:        Mood and Affect: Mood normal.        Behavior: Behavior normal.        Thought Content: Thought content normal.        Judgment: Judgment normal.     No results found for any visits on 02/15/23.  Last CBC Lab Results  Component Value Date   WBC 4.5 02/07/2023   HGB 14.6 02/07/2023   HCT 41.4 02/07/2023   MCV 87.9 02/07/2023   MCH 31.0 02/07/2023   RDW 11.9 02/07/2023   PLT 162 [id number]   Last metabolic panel Lab Results  Component Value Date   GLUCOSE 107 (H) 02/07/2023   NA 138 02/07/2023   K 3.8 02/07/2023   CL 107 02/07/2023   CO2 23 02/07/2023   BUN 7 (L) 02/07/2023   CREATININE 0.95 02/07/2023   GFRNONAA >60 02/07/2023   CALCIUM 9.0 02/07/2023   PROT 7.2 02/07/2023   ALBUMIN 3.8 02/07/2023   BILITOT 1.2 02/07/2023   ALKPHOS 80 02/07/2023   AST 33 02/07/2023   ALT 32 02/07/2023   ANIONGAP 8 02/07/2023  The 10-year ASCVD risk score (Arnett DK, et al., 2019) is: 46.2%    Assessment & Plan:   Problem List Items Addressed This Visit     Chronic abdominal pain    Returns for follow-up on chronic abdominal pain.  Son present with patient today reports that patient has had abdominal pain for the last 9 years.  His recent emergency department visits for abdominal pain include October 18, 2022, November 08, 2022, December 05, 2022, and January 25, 2023.  Recent CT scans of abdomen/pelvis done November 08, 2022 no acute findings and again in December 05, 2022 with no acute findings.  Patient denies nausea vomiting diarrhea constipation.  Unsure of last colonoscopy date, gastroenterology referral placed today for chronic abdominal pain and colonoscopy.  Last lab work done on February 07, 2023, lipase normal, complete metabolic panel is stable, CBC without anemia.        Relevant Orders   Ambulatory  referral to Gastroenterology   Need for immunization against influenza - Primary   Relevant Orders   Flu vaccine, recombinat, quadrivalent, inj (Completed)  Agrees with plan of care discussed.  Questions answered. Labs on 02/07/23: lipase normal, CMP stable, CBC without anemia.  Return in 4 weeks to see PCP for paperwork regarding caregiver services.    Return in about 4 weeks (around 03/15/2023) for paper work for caregiver services .    Chalmers Guest, FNP

## 2023-02-15 NOTE — Assessment & Plan Note (Addendum)
Returns for follow-up on chronic abdominal pain.  Son present with patient today reports that patient has had abdominal pain for the last 9 years.  His recent emergency department visits for abdominal pain include October 18, 2022, November 08, 2022, December 05, 2022, and January 25, 2023.  Recent CT scans of abdomen/pelvis done November 08, 2022 no acute findings and again in December 05, 2022 with no acute findings.  Patient denies nausea vomiting diarrhea constipation.  Unsure of last colonoscopy date, gastroenterology referral placed today for chronic abdominal pain and colonoscopy.  Last lab work done on February 07, 2023, lipase normal, complete metabolic panel is stable, CBC without anemia.

## 2023-02-16 ENCOUNTER — Other Ambulatory Visit (HOSPITAL_BASED_OUTPATIENT_CLINIC_OR_DEPARTMENT_OTHER): Payer: Self-pay

## 2023-02-16 ENCOUNTER — Telehealth: Payer: Self-pay | Admitting: Gastroenterology

## 2023-02-16 NOTE — Telephone Encounter (Signed)
Dr. Silverio Decamp,  We received an urgent referral from patient's PCP for severe abd pain, seen in ED several times.  Patient saw you in 2018 and then saw GI in [place] in 2019 (records in epic).  He is now back in [place] and wanting to return to [facility].  Please advise scheduling and urgency?  Thanks

## 2023-02-16 NOTE — Telephone Encounter (Signed)
Reviewed CT abdomen pelvis, negative for acute GI pathology.  Please schedule next available appointment with APP.  Thank you

## 2023-02-18 ENCOUNTER — Ambulatory Visit (HOSPITAL_COMMUNITY): Payer: Medicare Other | Admitting: Anesthesiology

## 2023-02-18 ENCOUNTER — Other Ambulatory Visit (HOSPITAL_BASED_OUTPATIENT_CLINIC_OR_DEPARTMENT_OTHER): Payer: Self-pay

## 2023-02-18 ENCOUNTER — Encounter (HOSPITAL_COMMUNITY): Payer: Self-pay | Admitting: Urology

## 2023-02-18 ENCOUNTER — Ambulatory Visit (HOSPITAL_BASED_OUTPATIENT_CLINIC_OR_DEPARTMENT_OTHER): Payer: Medicare Other | Admitting: Anesthesiology

## 2023-02-18 ENCOUNTER — Ambulatory Visit (HOSPITAL_COMMUNITY)
Admission: RE | Admit: 2023-02-18 | Discharge: 2023-02-18 | Disposition: A | Discharge status: Home / Self Care | Payer: Medicare Other | Attending: Urology | Admitting: Urology

## 2023-02-18 ENCOUNTER — Encounter (HOSPITAL_COMMUNITY)
Admission: RE | Disposition: A | Discharge status: Home / Self Care | Payer: Self-pay | Source: Home / Self Care | Attending: Urology

## 2023-02-18 DIAGNOSIS — N329 Bladder disorder, unspecified: Secondary | ICD-10-CM

## 2023-02-18 DIAGNOSIS — D494 Neoplasm of unspecified behavior of bladder: Secondary | ICD-10-CM | POA: Diagnosis present

## 2023-02-18 DIAGNOSIS — Z8551 Personal history of malignant neoplasm of bladder: Secondary | ICD-10-CM | POA: Diagnosis not present

## 2023-02-18 DIAGNOSIS — Z87891 Personal history of nicotine dependence: Secondary | ICD-10-CM | POA: Diagnosis not present

## 2023-02-18 DIAGNOSIS — I10 Essential (primary) hypertension: Secondary | ICD-10-CM | POA: Insufficient documentation

## 2023-02-18 DIAGNOSIS — Z79899 Other long term (current) drug therapy: Secondary | ICD-10-CM | POA: Diagnosis not present

## 2023-02-18 DIAGNOSIS — M1990 Unspecified osteoarthritis, unspecified site: Secondary | ICD-10-CM | POA: Insufficient documentation

## 2023-02-18 DIAGNOSIS — K219 Gastro-esophageal reflux disease without esophagitis: Secondary | ICD-10-CM | POA: Insufficient documentation

## 2023-02-18 DIAGNOSIS — M4727 Other spondylosis with radiculopathy, lumbosacral region: Secondary | ICD-10-CM | POA: Diagnosis not present

## 2023-02-18 DIAGNOSIS — N3080 Other cystitis without hematuria: Secondary | ICD-10-CM

## 2023-02-18 HISTORY — PX: TRANSURETHRAL RESECTION OF BLADDER TUMOR: SHX2575

## 2023-02-18 HISTORY — PX: CYSTOSCOPY: SHX5120

## 2023-02-18 SURGERY — CYSTOSCOPY
Anesthesia: General | Site: Urethra

## 2023-02-18 MED ORDER — FENTANYL CITRATE (PF) 100 MCG/2ML IJ SOLN
INTRAMUSCULAR | Status: DC | PRN
  Administered 2023-02-18: 50 ug via INTRAVENOUS
  Administered 2023-02-18 (×2): 25 ug via INTRAVENOUS

## 2023-02-18 MED ORDER — EPHEDRINE 5 MG/ML INJ
INTRAVENOUS | Status: AC
  Filled 2023-02-18: qty 5

## 2023-02-18 MED ORDER — PROPOFOL 10 MG/ML IV BOLUS
INTRAVENOUS | Status: AC
  Filled 2023-02-18: qty 20

## 2023-02-18 MED ORDER — ONDANSETRON HCL 4 MG/2ML IJ SOLN
INTRAMUSCULAR | Status: AC
  Filled 2023-02-18: qty 2

## 2023-02-18 MED ORDER — FENTANYL CITRATE (PF) 100 MCG/2ML IJ SOLN
INTRAMUSCULAR | Status: AC
  Filled 2023-02-18: qty 2

## 2023-02-18 MED ORDER — PROPOFOL 10 MG/ML IV BOLUS
INTRAVENOUS | Status: DC | PRN
  Administered 2023-02-18: 160 mg via INTRAVENOUS

## 2023-02-18 MED ORDER — MEPERIDINE HCL 50 MG/ML IJ SOLN: 6.2500 mg | INTRAMUSCULAR | Status: DC | PRN

## 2023-02-18 MED ORDER — CEFAZOLIN SODIUM-DEXTROSE 2-4 GM/100ML-% IV SOLN
2.0000 g | Freq: Once | INTRAVENOUS | Status: AC
  Administered 2023-02-18: 2 g via INTRAVENOUS
  Filled 2023-02-18: qty 100

## 2023-02-18 MED ORDER — LIDOCAINE HCL (PF) 2 % IJ SOLN
INTRAMUSCULAR | Status: AC
  Filled 2023-02-18: qty 5

## 2023-02-18 MED ORDER — GLYCOPYRROLATE 0.2 MG/ML IJ SOLN
INTRAMUSCULAR | Status: DC | PRN
  Administered 2023-02-18: .2 mg via INTRAVENOUS

## 2023-02-18 MED ORDER — SODIUM CHLORIDE 0.9 % IR SOLN
Status: DC | PRN
  Administered 2023-02-18 (×2): 3000 mL

## 2023-02-18 MED ORDER — LIDOCAINE HCL (CARDIAC) PF 50 MG/5ML IV SOSY
PREFILLED_SYRINGE | INTRAVENOUS | Status: DC | PRN
  Administered 2023-02-18: 80 mg via INTRAVENOUS

## 2023-02-18 MED ORDER — STERILE WATER FOR IRRIGATION IR SOLN
Status: DC | PRN
  Administered 2023-02-18: 500 mL

## 2023-02-18 MED ORDER — EPHEDRINE SULFATE (PRESSORS) 50 MG/ML IJ SOLN
INTRAMUSCULAR | Status: DC | PRN
  Administered 2023-02-18: 10 mg via INTRAVENOUS

## 2023-02-18 MED ORDER — LACTATED RINGERS IV SOLN: INTRAVENOUS | Status: DC

## 2023-02-18 MED ORDER — CHLORHEXIDINE GLUCONATE 0.12 % MT SOLN
15.0000 mL | Freq: Once | OROMUCOSAL | Status: AC
  Administered 2023-02-18: 15 mL via OROMUCOSAL
  Filled 2023-02-18: qty 15

## 2023-02-18 MED ORDER — HYDROCODONE-ACETAMINOPHEN 5-325 MG PO TABS
1.0000 | ORAL_TABLET | Freq: Four times a day (QID) | ORAL | 0 refills | Status: AC | PRN
  Filled 2023-02-18: qty 30, 8d supply, fill #0

## 2023-02-18 MED ORDER — ONDANSETRON HCL 4 MG/2ML IJ SOLN: 4.0000 mg | Freq: Once | INTRAMUSCULAR | Status: DC | PRN

## 2023-02-18 MED ORDER — ORAL CARE MOUTH RINSE: 15.0000 mL | Freq: Once | OROMUCOSAL | Status: AC

## 2023-02-18 MED ORDER — ONDANSETRON HCL 4 MG/2ML IJ SOLN
INTRAMUSCULAR | Status: DC | PRN
  Administered 2023-02-18: 4 mg via INTRAVENOUS

## 2023-02-18 MED ORDER — HYDROMORPHONE HCL 1 MG/ML IJ SOLN
0.2500 mg | INTRAMUSCULAR | Status: DC | PRN
  Administered 2023-02-18: 0.5 mg via INTRAVENOUS
  Filled 2023-02-18: qty 0.5

## 2023-02-18 SURGICAL SUPPLY — 25 items
BAG DRAIN URO TABLE W/ADPT NS (BAG) ×2 IMPLANT
BAG HAMPER (MISCELLANEOUS) ×2 IMPLANT
BAG URINE DRAIN 2000ML AR STRL (UROLOGICAL SUPPLIES) ×2 IMPLANT
CATH FOLEY LATEX FREE 22FR (CATHETERS) ×2
CATH FOLEY LF 22FR (CATHETERS) IMPLANT
CLOTH BEACON ORANGE TIMEOUT ST (SAFETY) ×2 IMPLANT
ELECT LOOP 22F BIPOLAR SML (ELECTROSURGICAL) ×2
ELECTRODE LOOP 22F BIPOLAR SML (ELECTROSURGICAL) ×2 IMPLANT
GLOVE BIO SURGEON STRL SZ8 (GLOVE) ×2 IMPLANT
GLOVE BIOGEL PI IND STRL 7.0 (GLOVE) ×4 IMPLANT
GLOVE ECLIPSE 6.5 STRL STRAW (GLOVE) IMPLANT
GLOVE ECLIPSE 7.0 STRL STRAW (GLOVE) IMPLANT
GOWN STRL REUS W/TWL LRG LVL3 (GOWN DISPOSABLE) ×4 IMPLANT
GOWN STRL REUS W/TWL XL LVL3 (GOWN DISPOSABLE) ×2 IMPLANT
IV NS IRRIG 3000ML ARTHROMATIC (IV SOLUTION) ×4 IMPLANT
KIT TURNOVER CYSTO (KITS) ×2 IMPLANT
MANIFOLD NEPTUNE II (INSTRUMENTS) ×2 IMPLANT
PACK CYSTO (CUSTOM PROCEDURE TRAY) ×2 IMPLANT
PAD ARMBOARD 7.5X6 YLW CONV (MISCELLANEOUS) ×2 IMPLANT
SYR 30ML LL (SYRINGE) ×2 IMPLANT
SYR TOOMEY IRRIG 70ML (MISCELLANEOUS) ×2
SYRINGE TOOMEY IRRIG 70ML (MISCELLANEOUS) ×2 IMPLANT
TOWEL NATURAL 4PK STERILE (DISPOSABLE) ×2 IMPLANT
TOWEL OR 17X26 4PK STRL BLUE (TOWEL DISPOSABLE) ×2 IMPLANT
WATER STERILE IRR 500ML POUR (IV SOLUTION) ×2 IMPLANT

## 2023-02-18 NOTE — Anesthesia Procedure Notes (Signed)
Procedure Name: LMA Insertion Date/Time: 02/18/2023 7:39 AM  Performed by: Ollen Bowl, CRNAPre-anesthesia Checklist: Patient identified, Patient being monitored, Emergency Drugs available, Timeout performed and Suction available Patient Re-evaluated:Patient Re-evaluated prior to induction Oxygen Delivery Method: Circle System Utilized Preoxygenation: Pre-oxygenation with 100% oxygen Induction Type: IV induction Ventilation: Mask ventilation without difficulty LMA: LMA inserted LMA Size: 4.0 Number of attempts: 1 Placement Confirmation: positive ETCO2 and breath sounds checked- equal and bilateral

## 2023-02-18 NOTE — Anesthesia Postprocedure Evaluation (Signed)
Anesthesia Post Note  Patient: Jorge [name]  Procedure(s) Performed: CYSTOSCOPY (Urethra) TRANSURETHRAL RESECTION OF BLADDER TUMOR (TURBT)- prostatic urethra biopsy (Bladder)  Patient location during evaluation: Phase II Anesthesia Type: General Level of consciousness: awake and alert and oriented Pain management: pain level controlled Vital Signs Assessment: post-procedure vital signs reviewed and stable Respiratory status: spontaneous breathing, nonlabored ventilation and respiratory function stable Cardiovascular status: blood pressure returned to baseline and stable Postop Assessment: no apparent nausea or vomiting Anesthetic complications: no  No notable events documented.   Last Vitals:  Vitals:   02/18/23 0900 02/18/23 0928  BP: 122/78 (!) 155/87  Pulse: 73 73  Resp: 11 16  Temp:  36.8 C  SpO2: 97% 96%    Last Pain:  Vitals:   02/18/23 0928  TempSrc: Oral  PainSc: 5                  Fatima Fedie C Bitania Shankland

## 2023-02-18 NOTE — Transfer of Care (Signed)
Immediate Anesthesia Transfer of Care Note  Patient: Jorge [name]  Procedure(s) Performed: CYSTOSCOPY (Urethra) TRANSURETHRAL RESECTION OF BLADDER TUMOR (TURBT)- prostatic urethra biopsy (Bladder)  Patient Location: PACU  Anesthesia Type:General  Level of Consciousness: awake  Airway & Oxygen Therapy: Patient Spontanous Breathing  Post-op Assessment: Report given to RN  Post vital signs: Reviewed and stable  Last Vitals:  Vitals Value Taken Time  BP 141/77 02/18/23 0830  Temp 36.8 C 02/18/23 0831  Pulse 92 02/18/23 0833  Resp 15 02/18/23 0833  SpO2 94 % 02/18/23 0833  Vitals shown include unvalidated device data.  Last Pain:  Vitals:   02/18/23 0710  PainSc: 0-No pain         Complications: No notable events documented.

## 2023-02-18 NOTE — H&P (Signed)
Bladder lesion     HPI:   Jorge [name] is a 72yo here for evaluation of a bladder mass. He was diagnosed with a bladder tumor in [place] over 1 year ago. He has not had a cystoscopy in 1 year. He has intermittent dysuria for the past 2-3 months.    PMH:     Past Medical History:  Diagnosis Date   Acid reflux     Arthritis      right leg   Cataract      bil removed   Hypertension        Surgical History:      Past Surgical History:  Procedure Laterality Date   cataracts Bilateral     LUMBAR LAMINECTOMY/DECOMPRESSION MICRODISCECTOMY Right 12/25/2016    Procedure: Lumbar three-five Laminectomy, Right Lumbar three-four, Lumbar four-five Diskectomy ;  Surgeon: Kevan Ny Ditty, MD;  Location: [facility];  Service: Neurosurgery;  Laterality: Right;      Home Medications:  Allergies as of 01/08/2023   No Known Allergies         Medication List           Accurate as of January 08, 2023 10:49 AM. If you have any questions, ask your nurse or doctor.              atorvastatin 40 MG tablet Commonly known as: LIPITOR Take 40 mg by mouth at bedtime.    dicyclomine 20 MG tablet Commonly known as: BENTYL Take 0.5 tablets (10 mg total) by mouth 2 (two) times daily for 7 days.    gabapentin 300 MG capsule Commonly known as: NEURONTIN Take 1 capsule (300 mg total) by mouth 3 (three) times daily.    Klor-Con M20 20 MEQ tablet Generic drug: potassium chloride SA Take 20 mEq by mouth daily.    lisinopril 10 MG tablet Commonly known as: ZESTRIL Take 1 tablet (10 mg total) by mouth daily.    omeprazole 40 MG capsule Commonly known as: PRILOSEC Take 1 capsule (40 mg total) by mouth daily.    pantoprazole 40 MG tablet Commonly known as: Protonix Take 1 tablet (40 mg total) by mouth 2 (two) times daily before a meal.    sucralfate 1 g tablet Commonly known as: Carafate Take 1 tablet (1 g total) by mouth with breakfast, with lunch, and with evening meal for 14 days.    tamsulosin  0.4 MG Caps capsule Commonly known as: FLOMAX Take 1 capsule (0.4 mg total) by mouth daily.             Allergies: No Known Allergies   Family History:      Family History  Problem Relation Age of Onset   Colon cancer Neg Hx     Esophageal cancer Neg Hx     Pancreatic cancer Neg Hx     Prostate cancer Neg Hx     Rectal cancer Neg Hx     Stomach cancer Neg Hx        Social History:  reports that he quit smoking about 11 years ago. His smoking use included cigarettes. He has a 10.00 pack-year smoking history. He has never used smokeless tobacco. He reports that he does not drink alcohol and does not use drugs.   ROS: All other review of systems were reviewed and are negative except what is noted above in HPI   Physical Exam: BP 119/65   Pulse 73   Constitutional:  Alert and oriented, No acute distress. HEENT: [place] AT,  moist mucus membranes.  Trachea midline, no masses. Cardiovascular: No clubbing, cyanosis, or edema. Respiratory: Normal respiratory effort, no increased work of breathing. GI: Abdomen is soft, nontender, nondistended, no abdominal masses GU: No CVA tenderness.  Lymph: No cervical or inguinal lymphadenopathy. Skin: No rashes, bruises or suspicious lesions. Neurologic: Grossly intact, no focal deficits, moving all 4 extremities. Psychiatric: Normal mood and affect.   Laboratory Data: Recent Labs       Lab Results  Component Value Date    WBC 4.7 12/05/2022    HGB 14.9 12/05/2022    HCT 42.1 12/05/2022    MCV 86.3 12/05/2022    PLT 163 12/05/2022        Recent Labs       Lab Results  Component Value Date    CREATININE 0.92 12/05/2022        Recent Labs  No results found for: "PSA"     Recent Labs  No results found for: "TESTOSTERONE"     Recent Labs       Lab Results  Component Value Date    HGBA1C 5.9 (H) 12/16/2016        Urinalysis Labs (Brief)          Component Value Date/Time    COLORURINE COLORLESS (A) 11/07/2022 2031     APPEARANCEUR CLEAR 11/07/2022 2031    LABSPEC <1.005 (L) 11/07/2022 2031    PHURINE 7.5 11/07/2022 2031    GLUCOSEU NEGATIVE 11/07/2022 2031    HGBUR NEGATIVE 11/07/2022 2031    BILIRUBINUR NEGATIVE 11/07/2022 2031    KETONESUR NEGATIVE 11/07/2022 2031    PROTEINUR NEGATIVE 11/07/2022 2031    NITRITE NEGATIVE 11/07/2022 2031    LEUKOCYTESUR NEGATIVE 11/07/2022 2031        Recent Labs       Lab Results  Component Value Date    BACTERIA NONE SEEN 03/28/2021        Pertinent Imaging:   Results for orders placed during the hospital encounter of 12/10/16   DG Abdomen 1 View   Narrative CLINICAL DATA:  72 y/o  M; 2-3 days of constipation.   EXAM: ABDOMEN - 1 VIEW   COMPARISON:  None.   FINDINGS: Normal bowel gas pattern. Small volume of stool within the colon. No radiopaque stone disease identified.   IMPRESSION: Negative.     Electronically Signed By: Kristine Garbe M.D. On: 12/10/2016 04:10   No results found for this or any previous visit.   No results found for this or any previous visit.   No results found for this or any previous visit.   No results found for this or any previous visit.   No valid procedures specified. No results found for this or any previous visit.   Results for orders placed during the hospital encounter of 04/01/22   CT Renal Stone Study   Narrative CLINICAL DATA:  Flank pain, kidney stone suspected. Painful urination. Constipation. Left lower quadrant pain.   EXAM: CT ABDOMEN AND PELVIS WITHOUT CONTRAST   TECHNIQUE: Multidetector CT imaging of the abdomen and pelvis was performed following the standard protocol without IV contrast.   RADIATION DOSE REDUCTION: This exam was performed according to the departmental dose-optimization program which includes automated exposure control, adjustment of the mA and/or kV according to patient size and/or use of iterative reconstruction technique.   COMPARISON:   01/18/2022   FINDINGS: Lower chest: Mild dependent atelectasis in the lung bases.   Hepatobiliary: No focal liver abnormality is seen. No  gallstones, gallbladder wall thickening, or biliary dilatation.   Pancreas: Unremarkable. No pancreatic ductal dilatation or surrounding inflammatory changes.   Spleen: Normal in size without focal abnormality.   Adrenals/Urinary Tract: Adrenal glands are unremarkable. Kidneys are normal, without renal calculi, focal lesion, or hydronephrosis. Bladder is unremarkable.   Stomach/Bowel: Stomach is within normal limits. Appendix appears normal. No evidence of bowel wall thickening, distention, or inflammatory changes.   Vascular/Lymphatic: Aortic atherosclerosis. No enlarged abdominal or pelvic lymph nodes.   Reproductive: Prostate is unremarkable.   Other: No abdominal wall hernia or abnormality. No abdominopelvic ascites.   Musculoskeletal: No acute or significant osseous findings. Postoperative laminectomies at L4 and L5. 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   IMPRESSION: 1. No renal or ureteral stone or obstruction. 2. No evidence of bowel obstruction or inflammation. 3. Aortic atherosclerosis.     Electronically Signed By: Lucienne Capers M.D. On: 04/01/2022 01:08     Assessment & Plan:     1. Lesion of bladder -the risks/benefits/alternatives to prostatic uretrha biopsy was explained to the patient and he understands and wishes to proceed with the surgery - Urinalysis, Routine w reflex microscopic     No follow-ups on file.   Nicolette Bang, MD   [facility] [place]

## 2023-02-18 NOTE — Op Note (Signed)
.  Preoperative diagnosis: bladder tumor  Postoperative diagnosis: Same  Procedure: 1 cystoscopy 2. Transurethral resection of bladder tumor, small 3. 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  Ureteral orifices in normal anatomic location.   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  Procedure in detail: The patient was brought to the operating room and a brief timeout was done to ensure correct patient, correct procedure, correct site.  General anesthesia was administered patient was placed in dorsal lithotomy position.  Their genitalia was then prepped and draped in usual sterile fashion.  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 Using the sequential dilators we dilated the urethral from 16 french to 28 french. We then passed the scope into the bladder.  Bladder was inspected and we noted a large a 0.5cm tumor in the prostatic urethra.  the ureteral orifices were in the normal orthotopic locations.  We then removed the cystoscope and placed a resectoscope into the bladder.  Using the bipolar resectoscope we removed the tumor down to the base. Hemostasis was then obtained with electrocautery. We then removed the bladder tumor chips and sent them for pathology. We then re-inspected the bladder and found no residula bleeding.  the bladder was then drained, a 22 French foley was placed and this concluded the procedure which was well tolerated by patient.  Complications: None  Condition:  Stable, extubated, transferred to PACU  Plan: Patient is to be discharged home and followup in 5 days for foley catheter removal and pathology discussion.

## 2023-02-18 NOTE — Anesthesia Preprocedure Evaluation (Addendum)
Anesthesia Evaluation  Patient identified by MRN, date of birth, ID band Patient awake    Reviewed: Allergy & Precautions, H&P , NPO status , Patient's Chart, lab work & pertinent test results  History of Anesthesia Complications Negative for: history of anesthetic complications  Airway Mallampati: II  TM Distance: >3 FB Neck ROM: Full    Dental  (+) Dental Advisory Given, Missing   Pulmonary shortness of breath and with exertion, former smoker   Pulmonary exam normal breath sounds clear to auscultation       Cardiovascular Exercise Tolerance: Good hypertension, Pt. 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)  negative psych ROS   GI/Hepatic Neg liver ROS,GERD  Medicated and Controlled,,  Endo/Other  negative endocrine ROS    Renal/GU negative Renal ROS  negative genitourinary   Musculoskeletal  (+) Arthritis , Osteoarthritis,    Abdominal   Peds negative pediatric ROS (+)  Hematology negative hematology ROS (+)   Anesthesia Other Findings   Reproductive/Obstetrics negative OB ROS                             Anesthesia Physical Anesthesia Plan  ASA: 2  Anesthesia Plan: General   Post-op Pain Management: Dilaudid IV   Induction: Intravenous  PONV Risk Score and Plan: 4 or greater and Ondansetron and Dexamethasone  Airway Management Planned: LMA  Additional Equipment:   Intra-op Plan:   Post-operative Plan: Extubation in OR  Informed Consent: I have reviewed the patients History and Physical, chart, labs and discussed the procedure including the risks, benefits and alternatives for the proposed anesthesia with the patient or authorized representative who has indicated his/her understanding and acceptance.     Dental advisory given  Plan Discussed with: CRNA and  Surgeon  Anesthesia Plan Comments:         Anesthesia Quick Evaluation

## 2023-02-19 LAB — SURGICAL PATHOLOGY

## 2023-02-24 ENCOUNTER — Encounter: Payer: Self-pay | Admitting: Urology

## 2023-02-24 ENCOUNTER — Ambulatory Visit (INDEPENDENT_AMBULATORY_CARE_PROVIDER_SITE_OTHER): Payer: Medicare Other | Admitting: Urology

## 2023-02-24 VITALS — BP 165/88 | HR 69

## 2023-02-24 DIAGNOSIS — N329 Bladder disorder, unspecified: Secondary | ICD-10-CM

## 2023-02-24 NOTE — Progress Notes (Unsigned)
02/24/2023 9:19 AM   Jorge [name] [birth year] [id number]  Referring provider: de [place], Raymond J, MD [address] [place],  [place] [address]  No chief complaint on file.   HPI:    PMH: Past Medical History:  Diagnosis Date   Acid reflux    Arthritis    right leg   Cataract    bil removed   Hypertension     Surgical History: Past Surgical History:  Procedure Laterality Date   cataracts Bilateral    LUMBAR LAMINECTOMY/DECOMPRESSION MICRODISCECTOMY Right 12/25/2016   Procedure: Lumbar three-five Laminectomy, Right Lumbar three-four, Lumbar four-five Diskectomy ;  Surgeon: Kevan Ny Ditty, MD;  Location: [facility];  Service: Neurosurgery;  Laterality: Right;    Home Medications:  Allergies as of 02/24/2023   No Known Allergies      Medication List        Accurate as of February 24, 2023  9:19 AM. If you have any questions, ask your nurse or doctor.          benzonatate 100 MG capsule Commonly known as: TESSALON Take 1 capsule (100 mg total) by mouth every 8 (eight) hours.   dicyclomine 20 MG tablet Commonly known as: BENTYL Take 0.5 tablets (10 mg total) by mouth 2 (two) times daily for 7 days.   gabapentin 300 MG capsule Commonly known as: NEURONTIN Take 1 capsule (300 mg total) by mouth 3 (three) times daily.   HYDROcodone-acetaminophen 5-325 MG tablet Commonly known as: Norco Take 1 tablet by mouth every 6 (six) hours as needed for moderate pain.   lisinopril 10 MG tablet Commonly known as: ZESTRIL Take 1 tablet (10 mg total) by mouth daily.   omeprazole 40 MG capsule Commonly known as: PRILOSEC Take 1 capsule (40 mg total) by mouth daily.   ondansetron 4 MG disintegrating tablet Commonly known as: ZOFRAN-ODT Dissolve 1 tablet under the tongue every 4 hours as needed for nausea/vomit   pantoprazole 40 MG tablet Commonly known as: Protonix Take 1 tablet (40 mg total) by mouth 2 (two) times daily before a meal.   tamsulosin 0.4  MG Caps capsule Commonly known as: FLOMAX Take 1 capsule (0.4 mg total) by mouth daily.        Allergies: No Known Allergies  Family History: Family History  Problem Relation Age of Onset   Colon cancer Neg Hx    Esophageal cancer Neg Hx    Pancreatic cancer Neg Hx    Prostate cancer Neg Hx    Rectal cancer Neg Hx    Stomach cancer Neg Hx     Social History:  reports that he quit smoking about 11 years ago. His smoking use included cigarettes. He has a 10.00 pack-year smoking history. He has never used smokeless tobacco. He reports that he does not drink alcohol and does not use drugs.  ROS: All other review of systems were reviewed and are negative except what is noted above in HPI  Physical Exam: BP (!) 165/88   Pulse 69   Constitutional:  Alert and oriented, No acute distress. HEENT: Power AT, moist mucus membranes.  Trachea midline, no masses. Cardiovascular: No clubbing, cyanosis, or edema. Respiratory: Normal respiratory effort, no increased work of breathing. GI: Abdomen is soft, nontender, nondistended, no abdominal masses GU: No CVA tenderness.  Lymph: No cervical or inguinal lymphadenopathy. Skin: No rashes, bruises or suspicious lesions. Neurologic: Grossly intact, no focal deficits, moving all 4 extremities. Psychiatric: Normal mood and affect.  Laboratory Data: Lab  Results  Component Value Date   WBC 4.5 02/07/2023   HGB 14.6 02/07/2023   HCT 41.4 02/07/2023   MCV 87.9 02/07/2023   PLT 162 02/07/2023    Lab Results  Component Value Date   CREATININE 0.95 02/07/2023    No results found for: "PSA"  No results found for: "TESTOSTERONE"  Lab Results  Component Value Date   HGBA1C 5.9 (H) 12/16/2016    Urinalysis    Component Value Date/Time   COLORURINE STRAW (A) 02/07/2023 0818   APPEARANCEUR CLEAR 02/07/2023 0818   APPEARANCEUR Clear 01/08/2023 1022   LABSPEC 1.005 02/07/2023 0818   PHURINE 7.0 02/07/2023 0818   GLUCOSEU NEGATIVE  02/07/2023 0818   HGBUR NEGATIVE 02/07/2023 0818   BILIRUBINUR NEGATIVE 02/07/2023 0818   BILIRUBINUR Negative 01/08/2023 1022   KETONESUR NEGATIVE 02/07/2023 0818   PROTEINUR NEGATIVE 02/07/2023 0818   NITRITE NEGATIVE 02/07/2023 0818   LEUKOCYTESUR NEGATIVE 02/07/2023 0818    Lab Results  Component Value Date   LABMICR Comment 01/08/2023   BACTERIA NONE SEEN 01/25/2023    Pertinent Imaging: *** Results for orders placed during the hospital encounter of 12/10/16  DG Abdomen 1 View  Narrative CLINICAL DATA:  72 y/o  M; 2-3 days of constipation.  EXAM: ABDOMEN - 1 VIEW  COMPARISON:  None.  FINDINGS: Normal bowel gas pattern. Small volume of stool within the colon. No radiopaque stone disease identified.  IMPRESSION: Negative.   Electronically Signed By: Kristine Garbe M.D. On: 12/10/2016 04:10  No results found for this or any previous visit.  No results found for this or any previous visit.  No results found for this or any previous visit.  Results for orders placed during the hospital encounter of 01/25/23  US Renal  Narrative CLINICAL DATA:  Bilateral flank pain and dysuria  EXAM: RENAL / URINARY TRACT ULTRASOUND COMPLETE  COMPARISON:  None Available.  FINDINGS: Right Kidney:  Renal measurements: 11.0 x 5.5 x 5.4 cm = volume: 171.9 mL. Echogenicity within normal limits. No mass or hydronephrosis visualized.  Left Kidney:  Renal measurements: 10.0 x 5.2 x 5.5 cm = volume: 150.4 mL. Echogenicity within normal limits. No mass or hydronephrosis visualized.  Bladder:  Appears normal for degree of bladder distention.  Other:  Prostatomegaly, measuring 5.0 x 4.0 x 4.7 cm with a volume of 49.4 mL.  IMPRESSION: 1. No hydronephrosis. 2. Prostatomegaly.   Electronically Signed By: Yetta Glassman M.D. On: 01/25/2023 08:59  No valid procedures specified. No results found for this or any previous visit.  Results for orders  placed during the hospital encounter of 04/01/22  CT Renal Stone Study  Narrative CLINICAL DATA:  Flank pain, kidney stone suspected. Painful urination. Constipation. Left lower quadrant pain.  EXAM: CT ABDOMEN AND PELVIS WITHOUT CONTRAST  TECHNIQUE: Multidetector CT imaging of the abdomen and pelvis was performed following the standard protocol without IV contrast.  RADIATION DOSE REDUCTION: This exam was performed according to the departmental dose-optimization program which includes automated exposure control, adjustment of the mA and/or kV according to patient size and/or use of iterative reconstruction technique.  COMPARISON:  01/18/2022  FINDINGS: Lower chest: Mild dependent atelectasis in the lung bases.  Hepatobiliary: No focal liver abnormality is seen. No gallstones, gallbladder wall thickening, or biliary dilatation.  Pancreas: Unremarkable. No pancreatic ductal dilatation or surrounding inflammatory changes.  Spleen: Normal in size without focal abnormality.  Adrenals/Urinary Tract: Adrenal glands are unremarkable. Kidneys are normal, without renal calculi, focal lesion, or hydronephrosis. Bladder  is unremarkable.  Stomach/Bowel: Stomach is within normal limits. Appendix appears normal. No evidence of bowel wall thickening, distention, or inflammatory changes.  Vascular/Lymphatic: Aortic atherosclerosis. No enlarged abdominal or pelvic lymph nodes.  Reproductive: Prostate is unremarkable.  Other: No abdominal wall hernia or abnormality. No abdominopelvic ascites.  Musculoskeletal: No acute or significant osseous findings. Postoperative laminectomies at L4 and L5. 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  IMPRESSION: 1. No renal or ureteral stone or obstruction. 2. No evidence of bowel obstruction or inflammation. 3. Aortic atherosclerosis.   Electronically Signed By: Lucienne Capers M.D. On: 04/01/2022 01:08   Assessment & Plan:    1. 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 I will see him back in 1 year for cystoscopy - Bladder Voiding Trial   No follow-ups on file.  Nicolette Bang, MD  [facility] [place]

## 2023-02-24 NOTE — Progress Notes (Signed)
Fill and Pull Catheter Removal  Patient is present today for a catheter removal.  Patient was cleaned and prepped in a sterile fashion 111m of sterile water/ saline was instilled into the bladder when the patient felt the urge to urinate. 119mof water was then drained from the balloon.  A 22FR foley cath was removed from the bladder no complications were noted .  Patient as then given some time to void on their own.  Patient can void  125 ml on their own after some time.  Patient tolerated well.  Performed by: ShMarisue BrooklynCMA  Follow up/ Additional notes: Keep F/U appt

## 2023-02-25 ENCOUNTER — Encounter (HOSPITAL_COMMUNITY): Payer: Self-pay | Admitting: Urology

## 2023-03-08 ENCOUNTER — Telehealth (HOSPITAL_BASED_OUTPATIENT_CLINIC_OR_DEPARTMENT_OTHER): Payer: Self-pay | Admitting: Family Medicine

## 2023-03-08 NOTE — Telephone Encounter (Signed)
Contacted Jorge [name] to schedule their annual wellness visit. Appointment made for 03/23/2023.  Thank you,  [place] Direct dial  [phone]

## 2023-03-10 ENCOUNTER — Encounter (HOSPITAL_BASED_OUTPATIENT_CLINIC_OR_DEPARTMENT_OTHER): Payer: Self-pay

## 2023-03-16 ENCOUNTER — Other Ambulatory Visit (HOSPITAL_BASED_OUTPATIENT_CLINIC_OR_DEPARTMENT_OTHER): Payer: Self-pay

## 2023-03-16 ENCOUNTER — Ambulatory Visit (HOSPITAL_BASED_OUTPATIENT_CLINIC_OR_DEPARTMENT_OTHER): Payer: Medicare Other | Admitting: Family Medicine

## 2023-03-16 ENCOUNTER — Encounter (HOSPITAL_BASED_OUTPATIENT_CLINIC_OR_DEPARTMENT_OTHER): Payer: Self-pay | Admitting: Family Medicine

## 2023-03-16 VITALS — BP 123/64 | HR 85 | Temp 99.1°F | Ht 62.0 in | Wt 177.0 lb

## 2023-03-16 DIAGNOSIS — M4727 Other spondylosis with radiculopathy, lumbosacral region: Secondary | ICD-10-CM | POA: Diagnosis not present

## 2023-03-16 DIAGNOSIS — M79603 Pain in arm, unspecified: Secondary | ICD-10-CM | POA: Insufficient documentation

## 2023-03-16 DIAGNOSIS — M79601 Pain in right arm: Secondary | ICD-10-CM

## 2023-03-16 MED ORDER — DICLOFENAC SODIUM 1 % EX GEL
2.0000 g | Freq: Four times a day (QID) | CUTANEOUS | 1 refills | Status: AC
  Filled 2023-03-16: qty 100, 12d supply, fill #0
  Filled 2023-05-18: qty 100, 12d supply, fill #1

## 2023-03-16 NOTE — Progress Notes (Signed)
    Procedures performed today:    None.  Independent interpretation of notes and tests performed by another provider:   None.  Brief History, Exam, Impression, and Recommendations:    BP 123/64 (BP Location: Right Arm, Patient Position: Sitting, Cuff Size: Normal)   Pulse 85   Temp 99.1 F (37.3 C) (Oral)   Ht 5\' 2"  (1.575 m)   Wt 177 lb (80.3 kg)   SpO2 98%   BMI 32.37 kg/m   Lumbosacral spondylosis with radiculopathy Patient reports ongoing issues with low back pain with radiation of symptoms into lower extremities.  He has had chronic issues with this and has had imaging completed in the past.  Denies any new numbness or tingling into lower extremities, no saddle anesthesia, no new urinary symptoms.  He has had ongoing urinary tract evaluation, however denies any new symptoms related to this. On exam, no significant tenderness to palpation through spinous processes and lumbar spine, no significant paraspinal muscle tenderness.  Negative straight leg raise bilaterally.  Normal gait in office today.  Normal deep tendon reflexes in bilateral lower extremities. Review of prior imaging indicates that he has had MRI of lumbar spine completed, this was about 6 years ago.  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 Discussed options with patient and family today, they would prefer to proceed with further evaluation with surgical specialist which is reasonable, referral has been placed today.    Return in about 2 months (around 05/16/2023).   ___________________________________________ Chosen Geske de [place], MD, ABFM, CAQSM Primary Care and Sports Medicine [facility]

## 2023-03-23 ENCOUNTER — Ambulatory Visit (HOSPITAL_BASED_OUTPATIENT_CLINIC_OR_DEPARTMENT_OTHER): Payer: Medicare Other

## 2023-03-23 ENCOUNTER — Encounter (HOSPITAL_BASED_OUTPATIENT_CLINIC_OR_DEPARTMENT_OTHER): Payer: Self-pay

## 2023-03-23 VITALS — Ht 62.0 in | Wt 177.0 lb

## 2023-03-23 DIAGNOSIS — Z0000 Encounter for general adult medical examination without abnormal findings: Secondary | ICD-10-CM | POA: Diagnosis not present

## 2023-03-23 DIAGNOSIS — Z1211 Encounter for screening for malignant neoplasm of colon: Secondary | ICD-10-CM | POA: Diagnosis not present

## 2023-03-23 NOTE — Progress Notes (Signed)
Subjective:   Jorge [name] is a 72 y.o. male who presents for Medicare Annual/Subsequent preventive examination.  Review of Systems    Virtual Visit via Telephone Note  I connected with  Jorge [name] on 03/23/23 at  3:30 PM EDT by telephone and verified that I am speaking with the correct person using two identifiers.  Location: Patient: Home Provider: Office Persons participating in the virtual visit: patient/Nurse Health Advisor   I discussed the limitations, risks, security and privacy concerns of performing an evaluation and management service by telephone and the availability of in person appointments. The patient expressed understanding and agreed to proceed.  Interactive audio and video telecommunications were attempted between this nurse and patient, however failed, due to patient having technical difficulties OR patient did not have access to video capability.  We continued and completed visit with audio only.  Some vital signs may be absent or patient reported.   Criselda Peaches, LPN  Cardiac Risk Factors include: advanced age (>76men, >53 women);male gender;hypertension     Objective:    Today's Vitals   03/23/23 1545  Weight: 177 lb (80.3 kg)  Height: 5\' 2"  (1.575 m)   Body mass index is 32.37 kg/m.     03/23/2023    3:49 PM 02/18/2023    6:53 AM 02/15/2023    2:21 PM 02/05/2023    9:28 AM 01/25/2023    7:18 AM 12/05/2022    5:10 AM 11/07/2022    8:30 PM  Advanced Directives  Does Patient Have a Medical Advance Directive? No No No No No No No  Would patient like information on creating a medical advance directive? No - Patient declined No - Patient declined No - Patient declined   No - Patient declined No - Patient declined    Current Medications (verified) Outpatient Encounter Medications as of 03/23/2023  Medication Sig   benzonatate (TESSALON) 100 MG capsule Take 1 capsule (100 mg total) by mouth every 8 (eight) hours.   diclofenac Sodium  (VOLTAREN) 1 % GEL Apply 2 g topically 4 (four) times daily to affected joint.   dicyclomine (BENTYL) 20 MG tablet Take 0.5 tablets (10 mg total) by mouth 2 (two) times daily for 7 days. (Patient not taking: Reported on 02/11/2023)   gabapentin (NEURONTIN) 300 MG capsule Take 1 capsule (300 mg total) by mouth 3 (three) times daily.   HYDROcodone-acetaminophen (NORCO) 5-325 MG tablet Take 1 tablet by mouth every 6 (six) hours as needed for moderate pain.   lisinopril (ZESTRIL) 10 MG tablet Take 1 tablet (10 mg total) by mouth daily.   omeprazole (PRILOSEC) 40 MG capsule Take 1 capsule (40 mg total) by mouth daily.   ondansetron (ZOFRAN-ODT) 4 MG disintegrating tablet Dissolve 1 tablet under the tongue every 4 hours as needed for nausea/vomit   pantoprazole (PROTONIX) 40 MG tablet Take 1 tablet (40 mg total) by mouth 2 (two) times daily before a meal.   tamsulosin (FLOMAX) 0.4 MG CAPS capsule Take 1 capsule (0.4 mg total) by mouth daily.   No facility-administered encounter medications on file as of 03/23/2023.    Allergies (verified) Patient has no known allergies.   History: Past Medical History:  Diagnosis Date   Acid reflux    Arthritis    right leg   Cataract    bil removed   Hypertension    Past Surgical History:  Procedure Laterality Date   cataracts Bilateral    CYSTOSCOPY N/A 02/18/2023   Procedure: CYSTOSCOPY;  Surgeon: Cleon Gustin, MD;  Location: [facility];  Service: Urology;  Laterality: N/A;   LUMBAR LAMINECTOMY/DECOMPRESSION MICRODISCECTOMY Right 12/25/2016   Procedure: Lumbar three-five Laminectomy, Right Lumbar three-four, Lumbar four-five Diskectomy ;  Surgeon: Kevan Ny Ditty, MD;  Location: [facility];  Service: Neurosurgery;  Laterality: Right;   TRANSURETHRAL RESECTION OF BLADDER TUMOR N/A 02/18/2023   Procedure: TRANSURETHRAL RESECTION OF BLADDER TUMOR (TURBT)- prostatic urethra biopsy;  Surgeon: Cleon Gustin, MD;  Location: [facility];  Service: Urology;   Laterality: N/A;   Family History  Problem Relation Age of Onset   Colon cancer Neg Hx    Esophageal cancer Neg Hx    Pancreatic cancer Neg Hx    Prostate cancer Neg Hx    Rectal cancer Neg Hx    Stomach cancer Neg Hx    Social History   Socioeconomic History   Marital status: Married    Spouse name: Not on file   Number of children: Not on file   Years of education: Not on file   Highest education level: Not on file  Occupational History   Not on file  Tobacco Use   Smoking status: Former    Packs/day: 0.25    Years: 40.00    Additional pack years: 0.00    Total pack years: 10.00    Types: Cigarettes    Quit date: 12/17/2011    Years since quitting: 11.2   Smokeless tobacco: Never  Vaping Use   Vaping Use: Never used  Substance and Sexual Activity   Alcohol use: No   Drug use: No   Sexual activity: Not on file  Other Topics Concern   Not on file  Social History Narrative   Not on file   Social Determinants of Health   Financial Resource Strain: Low Risk  (03/23/2023)   Overall Financial Resource Strain (CARDIA)    Difficulty of Paying Living Expenses: Not hard at all  Food Insecurity: No Food Insecurity (03/23/2023)   Hunger Vital Sign    Worried About Running Out of Food in the Last Year: Never true    Ran Out of Food in the Last Year: Never true  Transportation Needs: No Transportation Needs (03/23/2023)   PRAPARE - Hydrologist (Medical): No    Lack of Transportation (Non-Medical): No  Physical Activity: Insufficiently Active (03/23/2023)   Exercise Vital Sign    Days of Exercise per Week: 5 days    Minutes of Exercise per Session: 20 min  Stress: No Stress Concern Present (03/23/2023)   [place]    Feeling of Stress : Not at all  Social Connections: Moderately Isolated (03/23/2023)   Social Connection and Isolation Panel [NHANES]    Frequency of Communication  with Friends and Family: More than three times a week    Frequency of Social Gatherings with Friends and Family: More than three times a week    Attends Religious Services: Never    Marine scientist or Organizations: No    Attends Music therapist: Never    Marital Status: Married    Tobacco Counseling Counseling given: Not Answered   Clinical Intake:  Pre-visit preparation completed: No  Pain : No/denies pain     BMI - recorded: 32.37 Nutritional Status: BMI > 30  Obese Nutritional Risks: None Diabetes: No  How often do you need to have someone help you when you read instructions, pamphlets, or other  written materials from your doctor or pharmacy?: 4 - Often (Family assist)  Diabetic?  No  Interpreter Needed?: No  Information entered by :: Rolene Arbour LPN   Activities of Daily Living    03/23/2023    3:57 PM 03/16/2023    3:58 PM  In your present state of health, do you have any difficulty performing the following activities:  Hearing? 0 0  Vision? 0 0  Difficulty concentrating or making decisions? 0 0  Walking or climbing stairs? 0 0  Dressing or bathing? 0 0  Doing errands, shopping? 0 0  Preparing Food and eating ? N   Using the Toilet? N   In the past six months, have you accidently leaked urine? N   Do you have problems with loss of bowel control? N   Managing your Medications? N   Managing your Finances? N   Housekeeping or managing your Housekeeping? N     Patient Care Team: de [place], Blondell Reveal, MD as PCP - General (Family Medicine)  Indicate any recent Medical Services you may have received from other than [facility] providers in the past year (date may be approximate).     Assessment:   This is a routine wellness examination for Jorge [name].  Hearing/Vision screen Hearing Screening - Comments:: Denies hearing difficulties   Vision Screening - Comments:: Wears reading glasses - up to date with routine eye exams with  [facility]  Dietary  issues and exercise activities discussed: Current Exercise Habits: Home exercise routine, Type of exercise: walking, Time (Minutes): 20, Frequency (Times/Week): 5, Weekly Exercise (Minutes/Week): 100, Intensity: Mild, Exercise limited by: None identified   Goals Addressed               This Visit's Progress     No current goal (pt-stated)         Depression Screen    03/23/2023    3:55 PM 03/16/2023    3:58 PM 11/12/2022    3:01 PM  PHQ 2/9 Scores  PHQ - 2 Score 0 0 0  PHQ- 9 Score   0  Exception Documentation  Medical reason Medical reason    Fall Risk    03/23/2023    3:58 PM 03/16/2023    3:58 PM 11/12/2022    3:01 PM  Fall Risk   Falls in the past year? 0 0 0  Number falls in past yr: 0 0 0  Injury with Fall? 0 0 0  Risk for fall due to : No Fall Risks No Fall Risks No Fall Risks  Follow up Falls prevention discussed Falls evaluation completed Falls evaluation completed    [place]:  Any stairs in or around the home? No  If so, are there any without handrails? No  Home free of loose throw rugs in walkways, pet beds, electrical cords, etc? Yes  Adequate lighting in your home to reduce risk of falls? Yes   ASSISTIVE DEVICES UTILIZED TO PREVENT FALLS:  Life alert? No  Use of a cane, walker or w/c? No  Grab bars in the bathroom? No  Shower chair or bench in shower? No  Elevated toilet seat or a handicapped toilet? No   TIMED UP AND GO:  Was the test performed? No . Audio Visit  Cognitive Function:        03/23/2023    3:58 PM  6CIT Screen  What Year? 4 points  What month? 3 points  What time? 3 points  Count  back from 20 4 points  Months in reverse 4 points  Repeat phrase 10 points  Total Score 28 points    Immunizations Immunization History  Administered Date(s) Administered   Influenza, Quadrivalent, Recombinant, Inj, Pf 02/15/2023      Flu Vaccine status: Completed at today's visit    Covid-19  vaccine status: Completed vaccines  Qualifies for Shingles Vaccine? Yes   Zostavax completed No   Shingrix Completed?: No.    Education has been provided regarding the importance of this vaccine. Patient has been advised to call insurance company to determine out of pocket expense if they have not yet received this vaccine. Advised may also receive vaccine at local pharmacy or Health Dept. Verbalized acceptance and understanding.  Screening Tests Health Maintenance  Topic Date Due   COVID-19 Vaccine (1) 04/08/2023 (Originally 06/28/1951)   Zoster Vaccines- Shingrix (1 of 2) 05/16/2023 (Originally 12/28/2000)   Pneumonia Vaccine 6+ Years old (1 of 1 - PCV) 02/16/2024 (Originally 12/29/2015)   COLONOSCOPY (Pts 45-11yrs Insurance coverage will need to be confirmed)  03/22/2024 (Originally 06/22/2022)   Medicare Annual Wellness (AWV)  03/22/2024   INFLUENZA VACCINE  Completed   HPV VACCINES  Aged Out   DTaP/Tdap/Td  Discontinued   Hepatitis C Screening  Discontinued    Health Maintenance  There are no preventive care reminders to display for this patient.   Colorectal cancer screening: Referral to GI placed 03/23/23. Pt aware the office will call re: appt.  Lung Cancer Screening: (Low Dose CT Chest recommended if Age 2-80 years, 30 pack-year currently smoking OR have quit w/in 15years.) does not qualify.     Additional Screening:   Vision Screening: Recommended annual ophthalmology exams for early detection of glaucoma and other disorders of the eye. Is the patient up to date with their annual eye exam?  No  Who is the provider or what is the name of the office in which the patient attends annual eye exams? [facility] If pt is not established with a provider, would they like to be referred to a provider to establish care? No .   Dental Screening: Recommended annual dental exams for proper oral hygiene  Community Resource Referral / Chronic Care Management:  CRR required this visit?  No    CCM required this visit?  No      Plan:     I have personally reviewed and noted the following in the patient's chart:   Medical and social history Use of alcohol, tobacco or illicit drugs  Current medications and supplements including opioid prescriptions. Patient is currently taking opioid prescriptions. Information provided to patient regarding non-opioid alternatives. Patient advised to discuss non-opioid treatment plan with their provider. Functional ability and status Nutritional status Physical activity Advanced directives List of other physicians Hospitalizations, surgeries, and ER visits in previous 12 months Vitals Screenings to include cognitive, depression, and falls Referrals and appointments  In addition, I have reviewed and discussed with patient certain preventive protocols, quality metrics, and best practice recommendations. A written personalized care plan for preventive services as well as general preventive health recommendations were provided to patient.     Criselda Peaches, LPN   [phone]   Nurse Notes: Patient unable to complete 6CIT due to language barrier.

## 2023-03-23 NOTE — Patient Instructions (Addendum)
Jorge [name] , Thank you for taking time to come for your Medicare Wellness Visit. I appreciate your ongoing commitment to your health goals. Please review the following plan we discussed and let me know if I can assist you in the future.   These are the goals we discussed:  Goals       No current goal (pt-stated)        This is a list of the screening recommended for you and due dates:  Health Maintenance  Topic Date Due   COVID-19 Vaccine (1) 04/08/2023*   Zoster (Shingles) Vaccine (1 of 2) 05/16/2023*   Pneumonia Vaccine (1 of 1 - PCV) 02/16/2024*   Colon Cancer Screening  03/22/2024*   Medicare Annual Wellness Visit  03/22/2024   Flu Shot  Completed   HPV Vaccine  Aged Out   DTaP/Tdap/Td vaccine  Discontinued   Hepatitis C Screening: USPSTF Recommendation to screen - Ages 18-79 yo.  Discontinued  *Topic was postponed. The date shown is not the original due date.   Opioid Pain Medicine Management Opioids are powerful medicines that are used to treat moderate to severe pain. When used for short periods of time, they can help you to: Sleep better. Do better in physical or occupational therapy. Feel better in the first few days after an injury. Recover from surgery. Opioids should be taken with the supervision of a trained health care provider. They should be taken for the shortest period of time possible. This is because opioids can be addictive, and the longer you take opioids, the greater your risk of addiction. This addiction can also be called opioid use disorder. What are the risks? Using opioid pain medicines for longer than 3 days increases your risk of side effects. Side effects include: Constipation. Nausea and vomiting. Breathing difficulties (respiratory depression). Drowsiness. Confusion. Opioid use disorder. Itching. Taking opioid pain medicine for a long period of time can affect your ability to do daily tasks. It also puts you at risk for: Motor vehicle  crashes. Depression. Suicide. Heart attack. Overdose, which can be life-threatening. What is a pain treatment plan? A pain treatment plan is an agreement between you and your health care provider. Pain is unique to each person, and treatments vary depending on your condition. To manage your pain, you and your health care provider need to work together. To help you do this: Discuss the goals of your treatment, including how much pain you might expect to have and how you will manage the pain. Review the risks and benefits of taking opioid medicines. Remember that a good treatment plan uses more than one approach and minimizes the chance of side effects. Be honest about the amount of medicines you take and about any drug or alcohol use. Get pain medicine prescriptions from only one health care provider. Pain can be managed with many types of alternative treatments. Ask your health care provider to refer you to one or more specialists who can help you manage pain through: Physical or occupational therapy. Counseling (cognitive behavioral therapy). Good nutrition. Biofeedback. Massage. Meditation. Non-opioid medicine. Following a gentle exercise program. How to use opioid pain medicine Taking medicine Take your pain medicine exactly as told by your health care provider. Take it only when you need it. If your pain gets less severe, you may take less than your prescribed dose if your health care provider approves. If you are not having pain, do nottake pain medicine unless your health care provider tells you to take it.  If your pain is severe, do nottry to treat it yourself by taking more pills than instructed on your prescription. Contact your health care provider for help. Write down the times when you take your pain medicine. It is easy to become confused while on pain medicine. Writing the time can help you avoid overdose. Take other over-the-counter or prescription medicines only as told by  your health care provider. Keeping yourself and others safe  While you are taking opioid pain medicine: Do not drive, use machinery, or power tools. Do not sign legal documents. Do not drink alcohol. Do not take sleeping pills. Do not supervise children by yourself. Do not do activities that require climbing or being in high places. Do not go to a lake, river, ocean, spa, or swimming pool. Do not share your pain medicine with anyone. Keep pain medicine in a locked cabinet or in a secure area where pets and children cannot reach it. Stopping your use of opioids If you have been taking opioid medicine for more than a few weeks, you may need to slowly decrease (taper) how much you take until you stop completely. Tapering your use of opioids can decrease your risk of symptoms of withdrawal, such as: Pain and cramping in the abdomen. Nausea. Sweating. Sleepiness. Restlessness. Uncontrollable shaking (tremors). Cravings for the medicine. Do not attempt to taper your use of opioids on your own. Talk with your health care provider about how to do this. Your health care provider may prescribe a step-down schedule based on how much medicine you are taking and how long you have been taking it. Getting rid of leftover pills Do not save any leftover pills. Get rid of leftover pills safely by: Taking the medicine to a prescription take-back program. This is usually offered by the county or law enforcement. Bringing them to a pharmacy that has a drug disposal container. Flushing them down the toilet. Check the label or package insert of your medicine to see whether this is safe to do. Throwing them out in the trash. Check the label or package insert of your medicine to see whether this is safe to do. If it is safe to throw it out, remove the medicine from the original container, put it into a sealable bag or container, and mix it with used coffee grounds, food scraps, dirt, or cat litter before putting  it in the trash. Follow these instructions at home: Activity Do exercises as told by your health care provider. Avoid activities that make your pain worse. Return to your normal activities as told by your health care provider. Ask your health care provider what activities are safe for you. General instructions You may need to take these actions to prevent or treat constipation: Drink enough fluid to keep your urine pale yellow. Take over-the-counter or prescription medicines. Eat foods that are high in fiber, such as beans, whole grains, and fresh fruits and vegetables. Limit foods that are high in fat and processed sugars, such as fried or sweet foods. Keep all follow-up visits. This is important. Where to find support If you have been taking opioids for a long time, you may benefit from receiving support for quitting from a local support group or counselor. Ask your health care provider for a referral to these resources in your area. Where to find more information Centers for Disease Control and Prevention (CDC): http://www.wolf.info/ [place] Food and Drug Administration (FDA): [place] Get help right away if: You may have taken too much of an opioid (  overdosed). Common symptoms of an overdose: Your breathing is slower or more shallow than normal. You have a very slow heartbeat (pulse). You have slurred speech. You have nausea and vomiting. Your pupils become very small. You have other potential symptoms: You are very confused. You faint or feel like you will faint. You have cold, clammy skin. You have blue lips or fingernails. You have thoughts of harming yourself or harming others. These symptoms may represent a serious problem that is an emergency. Do not wait to see if the symptoms will go away. Get medical help right away. Call your local emergency services (911 in the [place] Do not drive yourself to the hospital.  If you ever feel like you may hurt yourself or others, or have thoughts  about taking your own life, get help right away. Go to your nearest emergency department or: Call your local emergency services (911 in the [place] Call the [facility] 503-527-7921 in the [place] Call a suicide crisis helpline, such as the [facility] at 224-507-1517 or 988 in the [place]. This is open 24 hours a day in the [place] Text the Crisis Text Line at (208) 506-6934 (in the [place].). Summary Opioid medicines can help you manage moderate to severe pain for a short period of time. A pain treatment plan is an agreement between you and your health care provider. Discuss the goals of your treatment, including how much pain you might expect to have and how you will manage the pain. If you think that you or someone else may have taken too much of an opioid, get medical help right away. This information is not intended to replace advice given to you by your health care provider. Make sure you discuss any questions you have with your health care provider. Document Revised: 07/09/2021 Document Reviewed: 03/26/2021 Elsevier Patient Education  [place] directives: Advance directive discussed with you today. Even though you declined this today, please call our office should you change your mind, and we can give you the proper paperwork for you to fill out.   Conditions/risks identified: None  Next appointment: Follow up in one year for your annual wellness visit.   Preventive Care 21 Years and Older, Male  Preventive care refers to lifestyle choices and visits with your health care provider that can promote health and wellness. What does preventive care include? A yearly physical exam. This is also called an annual well check. Dental exams once or twice a year. Routine eye exams. Ask your health care provider how often you should have your eyes checked. Personal lifestyle choices, including: Daily care of your teeth and gums. Regular physical  activity. Eating a healthy diet. Avoiding tobacco and drug use. Limiting alcohol use. Practicing safe sex. Taking low doses of aspirin every day. Taking vitamin and mineral supplements as recommended by your health care provider. What happens during an annual well check? The services and screenings done by your health care provider during your annual well check will depend on your age, overall health, lifestyle risk factors, and family history of disease. Counseling  Your health care provider may ask you questions about your: Alcohol use. Tobacco use. Drug use. Emotional well-being. Home and relationship well-being. Sexual activity. Eating habits. History of falls. Memory and ability to understand (cognition). Work and work Statistician. Screening  You may have the following tests or measurements: Height, weight, and BMI. Blood pressure. Lipid and cholesterol levels. These may be checked every 5 years,  or more frequently if you are over 6 years old. Skin check. Lung cancer screening. You may have this screening every year starting at age 58 if you have a 30-pack-year history of smoking and currently smoke or have quit within the past 15 years. Fecal occult blood test (FOBT) of the stool. You may have this test every year starting at age 47. Flexible sigmoidoscopy or colonoscopy. You may have a sigmoidoscopy every 5 years or a colonoscopy every 10 years starting at age 24. Prostate cancer screening. Recommendations will vary depending on your family history and other risks. Hepatitis C blood test. Hepatitis B blood test. Sexually transmitted disease (STD) testing. Diabetes screening. This is done by checking your blood sugar (glucose) after you have not eaten for a while (fasting). You may have this done every 1-3 years. Abdominal aortic aneurysm (AAA) screening. You may need this if you are a current or former smoker. Osteoporosis. You may be screened starting at age 22 if you are  at high risk. Talk with your health care provider about your test results, treatment options, and if necessary, the need for more tests. Vaccines  Your health care provider may recommend certain vaccines, such as: Influenza vaccine. This is recommended every year. Tetanus, diphtheria, and acellular pertussis (Tdap, Td) vaccine. You may need a Td booster every 10 years. Zoster vaccine. You may need this after age 10. Pneumococcal 13-valent conjugate (PCV13) vaccine. One dose is recommended after age 10. Pneumococcal polysaccharide (PPSV23) vaccine. One dose is recommended after age 88. Talk to your health care provider about which screenings and vaccines you need and how often you need them. This information is not intended to replace advice given to you by your health care provider. Make sure you discuss any questions you have with your health care provider. Document Released: 01/10/2016 Document Revised: 09/02/2016 Document Reviewed: 10/15/2015 Elsevier Interactive Patient Education  2017 [place] Prevention in the Home Falls can cause injuries. They can happen to people of all ages. There are many things you can do to make your home safe and to help prevent falls. What can I do on the outside of my home? Regularly fix the edges of walkways and driveways and fix any cracks. Remove anything that might make you trip as you walk through a door, such as a raised step or threshold. Trim any bushes or trees on the path to your home. Use bright outdoor lighting. Clear any walking paths of anything that might make someone trip, such as rocks or tools. Regularly check to see if handrails are loose or broken. Make sure that both sides of any steps have handrails. Any raised decks and porches should have guardrails on the edges. Have any leaves, snow, or ice cleared regularly. Use sand or salt on walking paths during winter. Clean up any spills in your garage right away. This includes oil  or grease spills. What can I do in the bathroom? Use night lights. Install grab bars by the toilet and in the tub and shower. Do not use towel bars as grab bars. Use non-skid mats or decals in the tub or shower. If you need to sit down in the shower, use a plastic, non-slip stool. Keep the floor dry. Clean up any water that spills on the floor as soon as it happens. Remove soap buildup in the tub or shower regularly. Attach bath mats securely with double-sided non-slip rug tape. Do not have throw rugs and other things on the floor  that can make you trip. What can I do in the bedroom? Use night lights. Make sure that you have a light by your bed that is easy to reach. Do not use any sheets or blankets that are too big for your bed. They should not hang down onto the floor. Have a firm chair that has side arms. You can use this for support while you get dressed. Do not have throw rugs and other things on the floor that can make you trip. What can I do in the kitchen? Clean up any spills right away. Avoid walking on wet floors. Keep items that you use a lot in easy-to-reach places. If you need to reach something above you, use a strong step stool that has a grab bar. Keep electrical cords out of the way. Do not use floor polish or wax that makes floors slippery. If you must use wax, use non-skid floor wax. Do not have throw rugs and other things on the floor that can make you trip. What can I do with my stairs? Do not leave any items on the stairs. Make sure that there are handrails on both sides of the stairs and use them. Fix handrails that are broken or loose. Make sure that handrails are as long as the stairways. Check any carpeting to make sure that it is firmly attached to the stairs. Fix any carpet that is loose or worn. Avoid having throw rugs at the top or bottom of the stairs. If you do have throw rugs, attach them to the floor with carpet tape. Make sure that you have a light  switch at the top of the stairs and the bottom of the stairs. If you do not have them, ask someone to add them for you. What else can I do to help prevent falls? Wear shoes that: Do not have high heels. Have rubber bottoms. Are comfortable and fit you well. Are closed at the toe. Do not wear sandals. If you use a stepladder: Make sure that it is fully opened. Do not climb a closed stepladder. Make sure that both sides of the stepladder are locked into place. Ask someone to hold it for you, if possible. Clearly mark and make sure that you can see: Any grab bars or handrails. First and last steps. Where the edge of each step is. Use tools that help you move around (mobility aids) if they are needed. These include: Canes. Walkers. Scooters. Crutches. Turn on the lights when you go into a dark area. Replace any light bulbs as soon as they burn out. Set up your furniture so you have a clear path. Avoid moving your furniture around. If any of your floors are uneven, fix them. If there are any pets around you, be aware of where they are. Review your medicines with your doctor. Some medicines can make you feel dizzy. This can increase your chance of falling. Ask your doctor what other things that you can do to help prevent falls. This information is not intended to replace advice given to you by your health care provider. Make sure you discuss any questions you have with your health care provider. Document Released: 10/10/2009 Document Revised: 05/21/2016 Document Reviewed: 01/18/2015 Elsevier Interactive Patient Education  2017 [employer].

## 2023-04-08 ENCOUNTER — Encounter (HOSPITAL_BASED_OUTPATIENT_CLINIC_OR_DEPARTMENT_OTHER): Payer: Self-pay

## 2023-04-08 ENCOUNTER — Other Ambulatory Visit (HOSPITAL_BASED_OUTPATIENT_CLINIC_OR_DEPARTMENT_OTHER): Payer: Self-pay

## 2023-04-08 ENCOUNTER — Emergency Department (HOSPITAL_BASED_OUTPATIENT_CLINIC_OR_DEPARTMENT_OTHER)
Admission: EM | Admit: 2023-04-08 | Discharge: 2023-04-08 | Disposition: A | Discharge status: Home / Self Care | Payer: Medicare Other | Attending: Emergency Medicine | Admitting: Emergency Medicine

## 2023-04-08 ENCOUNTER — Emergency Department (HOSPITAL_BASED_OUTPATIENT_CLINIC_OR_DEPARTMENT_OTHER): Payer: Medicare Other

## 2023-04-08 ENCOUNTER — Other Ambulatory Visit: Payer: Self-pay

## 2023-04-08 DIAGNOSIS — Z79899 Other long term (current) drug therapy: Secondary | ICD-10-CM | POA: Insufficient documentation

## 2023-04-08 DIAGNOSIS — R1084 Generalized abdominal pain: Secondary | ICD-10-CM | POA: Diagnosis present

## 2023-04-08 DIAGNOSIS — R001 Bradycardia, unspecified: Secondary | ICD-10-CM | POA: Diagnosis not present

## 2023-04-08 DIAGNOSIS — I10 Essential (primary) hypertension: Secondary | ICD-10-CM | POA: Diagnosis not present

## 2023-04-08 DIAGNOSIS — K5900 Constipation, unspecified: Secondary | ICD-10-CM | POA: Insufficient documentation

## 2023-04-08 LAB — URINALYSIS, ROUTINE W REFLEX MICROSCOPIC
Bilirubin Urine: NEGATIVE
Glucose, UA: NEGATIVE mg/dL
Hgb urine dipstick: NEGATIVE
Ketones, ur: NEGATIVE mg/dL
Leukocytes,Ua: NEGATIVE
Nitrite: NEGATIVE
Protein, ur: NEGATIVE mg/dL
Specific Gravity, Urine: 1.008 (ref 1.005–1.030)
pH: 7.5 (ref 5.0–8.0)

## 2023-04-08 LAB — COMPREHENSIVE METABOLIC PANEL
ALT: 28 U/L (ref 0–44)
AST: 24 U/L (ref 15–41)
Albumin: 4.5 g/dL (ref 3.5–5.0)
Alkaline Phosphatase: 81 U/L (ref 38–126)
Anion gap: 8 (ref 5–15)
BUN: 11 mg/dL (ref 8–23)
CO2: 23 mmol/L (ref 22–32)
Calcium: 9.4 mg/dL (ref 8.9–10.3)
Chloride: 107 mmol/L (ref 98–111)
Creatinine, Ser: 0.76 mg/dL (ref 0.61–1.24)
GFR, Estimated: >60 mL/min (ref >60)
Glucose, Bld: 94 mg/dL (ref 70–99)
Potassium: 3.9 mmol/L (ref 3.5–5.1)
Sodium: 138 mmol/L (ref 135–145)
Total Bilirubin: 0.8 mg/dL (ref 0.3–1.2)
Total Protein: 7.2 g/dL (ref 6.5–8.1)

## 2023-04-08 LAB — LIPASE, BLOOD: Lipase: <10 U/L — ABNORMAL LOW (ref 11–51)

## 2023-04-08 LAB — CBC
HCT: 42.7 % (ref 39.0–52.0)
Hemoglobin: 14.7 g/dL (ref 13.0–17.0)
MCH: 29.9 pg (ref 26.0–34.0)
MCHC: 34.4 g/dL (ref 30.0–36.0)
MCV: 87 fL (ref 80.0–100.0)
Platelets: 174 10*3/uL (ref 150–400)
RBC: 4.91 MIL/uL (ref 4.22–5.81)
RDW: 12.1 % (ref 11.5–15.5)
WBC: 4.2 10*3/uL (ref 4.0–10.5)
nRBC: 0 % (ref 0.0–0.2)

## 2023-04-08 MED ORDER — FAMOTIDINE IN NACL 20-0.9 MG/50ML-% IV SOLN
20.0000 mg | Freq: Once | INTRAVENOUS | Status: AC
  Administered 2023-04-08: 20 mg via INTRAVENOUS
  Filled 2023-04-08: qty 50

## 2023-04-08 MED ORDER — ALUM & MAG HYDROXIDE-SIMETH 200-200-20 MG/5ML PO SUSP
30.0000 mL | Freq: Once | ORAL | Status: AC
  Administered 2023-04-08: 30 mL via ORAL
  Filled 2023-04-08: qty 30

## 2023-04-08 MED ORDER — ACETAMINOPHEN 325 MG PO TABS
650.0000 mg | ORAL_TABLET | Freq: Once | ORAL | Status: AC
  Administered 2023-04-08: 650 mg via ORAL
  Filled 2023-04-08: qty 2

## 2023-04-08 MED ORDER — IOHEXOL 300 MG/ML  SOLN
100.0000 mL | Freq: Once | INTRAMUSCULAR | Status: AC | PRN
  Administered 2023-04-08: 80 mL via INTRAVENOUS

## 2023-04-08 MED ORDER — FAMOTIDINE 20 MG PO TABS
20.0000 mg | ORAL_TABLET | Freq: Two times a day (BID) | ORAL | 0 refills | Status: DC
  Filled 2023-04-08: qty 30, 15d supply, fill #0

## 2023-04-08 MED ORDER — METAMUCIL SMOOTH TEXTURE 58.6 % PO POWD
1.0000 | Freq: Three times a day (TID) | ORAL | 12 refills | Status: AC
  Filled 2023-04-08 – 2023-05-18 (×2): qty 283, fill #0

## 2023-04-08 MED ORDER — SENNOSIDES-DOCUSATE SODIUM 8.6-50 MG PO TABS
1.0000 | ORAL_TABLET | Freq: Every day | ORAL | 0 refills | Status: DC
  Filled 2023-04-08: qty 100, 100d supply, fill #0

## 2023-04-08 MED ORDER — POLYETHYLENE GLYCOL 3350 17 G PO PACK
17.0000 g | PACK | Freq: Every day | ORAL | 0 refills | Status: DC
  Filled 2023-04-08 – 2023-05-18 (×2): qty 14, 14d supply, fill #0

## 2023-04-08 MED ORDER — MAALOX MAX 400-400-40 MG/5ML PO SUSP
15.0000 mL | Freq: Four times a day (QID) | ORAL | 0 refills | Status: AC | PRN
  Filled 2023-04-08: qty 355, 6d supply, fill #0

## 2023-04-08 NOTE — Discharge Instructions (Signed)
You were seen in the emergency department for your abdominal pain.  Your workup showed no signs of infection and is likely due to constipation and your acid reflux.  I have given you a stool softener, MiraLAX which is a laxative and a fiber supplement that you should take daily until you are having regular soft bowel movements.  Then you can start to take a half dose of the MiraLAX daily until you are having regular soft bowel movements and then can return to as needed.  You can then start taking the Senokot as needed once you are having daily soft bowel movements without the MiraLAX.  You should continue to take your omeprazole for your acid reflux and can take Pepcid and Maalox as needed for additional burning type of pain.  You should follow-up with your primary doctor in the next few days to have your symptoms rechecked.  You should return to the emergency department for significantly worsening pain, repetitive vomiting, fevers or if you have any other new or concerning symptoms.

## 2023-04-08 NOTE — ED Provider Notes (Signed)
[facility] AT [facility] Provider Note   CSN: [id number] Arrival date & time: 04/08/23  0857     History  Chief Complaint  Patient presents with   Abdominal Pain    Jorge [name] is a 72 y.o. male.  Patient is a 72 year old male with a past medical history of hypertension and GERD presenting to the emergency department with abdominal pain.  The patient states that for the last 2 days he has had a burning diffuse type of abdominal pain.  He states that it comes and goes.  He states that it does not seem to be affected by eating or with bowel movements.  He states that he has been constipated and has been straining with only small amount of stool coming out.  He states that his stools appear dark but denies any blood in his stools.  He denies any fevers or chills, nausea or vomiting, dysuria or hematuria.  The history is provided by the patient and a relative. The history is limited by a language barrier. No language interpreter was used (Requested family member to translate).  Abdominal Pain      Home Medications Prior to Admission medications   Medication Sig Start Date End Date Taking? Authorizing Provider  alum & mag hydroxide-simeth (MAALOX MAX) 400-400-40 MG/5ML suspension Take 15 mLs by mouth every 6 (six) hours as needed for indigestion. 04/08/23  Yes Theresia Lo, Benetta Spar K, DO  famotidine (PEPCID) 20 MG tablet Take 1 tablet (20 mg total) by mouth 2 (two) times daily. 04/08/23  Yes Theresia Lo, Turkey K, DO  polyethylene glycol (MIRALAX) 17 g packet Take 17 g by mouth daily. 04/08/23  Yes Theresia Lo, Briaunna Grindstaff K, DO  psyllium (METAMUCIL SMOOTH TEXTURE) 58.6 % powder Take 1 packet by mouth 3 (three) times daily. 04/08/23  Yes Theresia Lo, Turkey K, DO  senna-docusate (SENOKOT-S) 8.6-50 MG tablet Take 1 tablet by mouth daily. 04/08/23  Yes Theresia Lo, Turkey K, DO  benzonatate (TESSALON) 100 MG capsule Take 1 capsule (100 mg total) by mouth every 8 (eight)  hours. 02/05/23   Melene Plan, DO  diclofenac Sodium (VOLTAREN) 1 % GEL Apply 2 g topically 4 (four) times daily to affected joint. 03/16/23   de [place], Buren Kos, MD  dicyclomine (BENTYL) 20 MG tablet Take 0.5 tablets (10 mg total) by mouth 2 (two) times daily for 7 days. Patient not taking: Reported on 02/11/2023 01/25/23 02/11/23  Glynn Octave, MD  gabapentin (NEURONTIN) 300 MG capsule Take 1 capsule (300 mg total) by mouth 3 (three) times daily. 01/05/23   de [place], Buren Kos, MD  HYDROcodone-acetaminophen (NORCO) 5-325 MG tablet Take 1 tablet by mouth every 6 (six) hours as needed for moderate pain. 02/18/23   McKenzie, Mardene Celeste, MD  lisinopril (ZESTRIL) 10 MG tablet Take 1 tablet (10 mg total) by mouth daily. 11/12/22   de [place], Raymond J, MD  omeprazole (PRILOSEC) 40 MG capsule Take 1 capsule (40 mg total) by mouth daily. 02/08/23   Novella Olive, FNP  ondansetron (ZOFRAN-ODT) 4 MG disintegrating tablet Dissolve 1 tablet under the tongue every 4 hours as needed for nausea/vomit 02/05/23   Melene Plan, DO  pantoprazole (PROTONIX) 40 MG tablet Take 1 tablet (40 mg total) by mouth 2 (two) times daily before a meal. 12/05/22   Geoffery Lyons, MD  tamsulosin (FLOMAX) 0.4 MG CAPS capsule Take 1 capsule (0.4 mg total) by mouth daily. 01/27/23   de [place], Raymond J, MD      Allergies  Patient has no known allergies.    Review of Systems   Review of Systems  Gastrointestinal:  Positive for abdominal pain.    Physical Exam Updated Vital Signs BP 135/72 (BP Location: Left Arm)   Pulse (!) 51   Temp 97.8 F (36.6 C) (Oral)   Resp 16   Ht 5\' 2"  (1.575 m)   Wt 72.6 kg   SpO2 98%   BMI 29.26 kg/m  Physical Exam Vitals and nursing note reviewed.  Constitutional:      General: He is not in acute distress.    Appearance: He is well-developed.  HENT:     Head: Normocephalic and atraumatic.     Mouth/Throat:     Mouth: Mucous membranes are moist.     Pharynx: Oropharynx is clear.  Eyes:      Extraocular Movements: Extraocular movements intact.  Cardiovascular:     Rate and Rhythm: Regular rhythm. Bradycardia present.     Heart sounds: Normal heart sounds.  Pulmonary:     Effort: Pulmonary effort is normal.     Breath sounds: Normal breath sounds.  Abdominal:     General: Abdomen is flat.     Palpations: Abdomen is soft.     Tenderness: There is generalized abdominal tenderness. There is no right CVA tenderness, left CVA tenderness, guarding or rebound.  Skin:    General: Skin is warm and dry.  Neurological:     General: No focal deficit present.     Mental Status: He is alert and oriented to person, place, and time.  Psychiatric:        Mood and Affect: Mood normal.        Behavior: Behavior normal.     ED Results / Procedures / Treatments   Labs (all labs ordered are listed, but only abnormal results are displayed) Labs Reviewed  LIPASE, BLOOD - Abnormal; Notable for the following components:      Result Value   Lipase <10 (*)    All other components within normal limits  URINALYSIS, ROUTINE W REFLEX MICROSCOPIC - Abnormal; Notable for the following components:   Color, Urine COLORLESS (*)    All other components within normal limits  COMPREHENSIVE METABOLIC PANEL  CBC    EKG None  Radiology CT Abdomen Pelvis W Contrast  Result Date: 04/08/2023 CLINICAL DATA:  Acute generalized abdominal pain. EXAM: CT ABDOMEN AND PELVIS WITH CONTRAST TECHNIQUE: Multidetector CT imaging of the abdomen and pelvis was performed using the standard protocol following bolus administration of intravenous contrast. RADIATION DOSE REDUCTION: This exam was performed according to the departmental dose-optimization program which includes automated exposure control, adjustment of the mA and/or kV according to patient size and/or use of iterative reconstruction technique. CONTRAST:  32mL OMNIPAQUE IOHEXOL 300 MG/ML  SOLN COMPARISON:  December 05, 2022. FINDINGS: Lower chest: Stable 3 mm  nodule seen medially in right lower lobe. No acute abnormality seen. Hepatobiliary: Stable left hepatic cyst. No cholelithiasis or biliary dilatation. Pancreas: Unremarkable. No pancreatic ductal dilatation or surrounding inflammatory changes. Spleen: Normal in size without focal abnormality. Adrenals/Urinary Tract: Adrenal glands are unremarkable. Kidneys are normal, without renal calculi, focal lesion, or hydronephrosis. Bladder is unremarkable. Stomach/Bowel: Stomach is within normal limits. Appendix appears normal. No evidence of bowel wall thickening, distention, or inflammatory changes. Vascular/Lymphatic: Aortic atherosclerosis. No enlarged abdominal or pelvic lymph nodes. Reproductive: Prostate is unremarkable. Other: No abdominal wall hernia or abnormality. No abdominopelvic ascites. Musculoskeletal: No acute or significant osseous findings. IMPRESSION: Stable 3 mm  nodule seen in right lower lobe. No follow-up needed if patient is low-risk.This recommendation follows the consensus statement: Guidelines for Management of Incidental Pulmonary Nodules Detected on CT Images: From the Fleischner Society 2017; Radiology 2017; 284:228-243. No acute abnormality seen in the abdomen or pelvis. Aortic Atherosclerosis (ICD10-I70.0). Electronically Signed   By: Lupita Raider M.D.   On: 04/08/2023 10:31    Procedures Procedures    Medications Ordered in ED Medications  famotidine (PEPCID) IVPB 20 mg premix (has no administration in time range)  alum & mag hydroxide-simeth (MAALOX/MYLANTA) 200-200-20 MG/5ML suspension 30 mL (has no administration in time range)  acetaminophen (TYLENOL) tablet 650 mg (has no administration in time range)  iohexol (OMNIPAQUE) 300 MG/ML solution 100 mL (80 mLs Intravenous Contrast Given 04/08/23 1011)    ED Course/ Medical Decision Making/ A&P Clinical Course as of 04/08/23 1102  Thu Apr 08, 2023  1039 No acute abnormalities on CT, moderate stool burden. He will be started  on stool softners and miralax and recommended PCP follow up. [VK]    Clinical Course User Index [VK] Rexford Maus, DO                             Medical Decision Making This patient presents to the ED with chief complaint(s) of abdominal pain with pertinent past medical history of hypertension, GERD which further complicates the presenting complaint. The complaint involves an extensive differential diagnosis and also carries with it a high risk of complications and morbidity.    The differential diagnosis includes pancreatitis, hepatitis, cholelithiasis, cholecystitis, diverticulitis or other intra-abdominal infection, constipation, SBO unlikely as patient has been having bowel movements and no vomiting, UTI, gastritis, gastroenteritis, GERD  Additional history obtained: Additional history obtained from family Records reviewed Primary Care Documents and outpatient urology records  ED Course and Reassessment: Patient is well-appearing on arrival no acute distress.  Due to patient's age and diffuse abdominal tenderness he will have labs including LFTs and lipase, urine and CT abdomen and pelvis performed to evaluate for cause of his pain.  He will be given GI cocktail and Tylenol and will be closely reassessed.  Independent labs interpretation:  The following labs were independently interpreted: Within normal range  Independent visualization of imaging: - I independently visualized the following imaging with scope of interpretation limited to determining acute life threatening conditions related to emergency care: CT AP, which revealed constipation otherwise no acute disease  Consultation: - Consulted or discussed management/test interpretation w/ external professional: N/A  Consideration for admission or further workup: Patient has no emergent conditions requiring admission or further work-up at this time and is stable for discharge home with primary care follow-up  Social  Determinants of health: N/A    Amount and/or Complexity of Data Reviewed Labs: ordered. Radiology: ordered.  Risk OTC drugs. Prescription drug management.          Final Clinical Impression(s) / ED Diagnoses Final diagnoses:  Constipation, unspecified constipation type  Generalized abdominal pain    Rx / DC Orders ED Discharge Orders          Ordered    senna-docusate (SENOKOT-S) 8.6-50 MG tablet  Daily        04/08/23 1100    polyethylene glycol (MIRALAX) 17 g packet  Daily        04/08/23 1100    psyllium (METAMUCIL SMOOTH TEXTURE) 58.6 % powder  3 times daily  04/08/23 1100    famotidine (PEPCID) 20 MG tablet  2 times daily        04/08/23 1100    alum & mag hydroxide-simeth (MAALOX MAX) 400-400-40 MG/5ML suspension  Every 6 hours PRN        04/08/23 1100              LaramieKingsley, LevelockVictoria K, DO 04/08/23 1102

## 2023-04-08 NOTE — ED Notes (Signed)
Discharge paperwork given and verbally understood. 

## 2023-04-08 NOTE — ED Triage Notes (Signed)
Patient arrives to ED POV C/O abdominal pain x3 days. Pt states "I feel burning in my stomach and chest". Denies N/V/D but states he has been constipated. No other complaints at this time. Pt A/O x4.

## 2023-04-11 ENCOUNTER — Other Ambulatory Visit (HOSPITAL_BASED_OUTPATIENT_CLINIC_OR_DEPARTMENT_OTHER): Payer: Self-pay | Admitting: Emergency Medicine

## 2023-04-11 ENCOUNTER — Other Ambulatory Visit (HOSPITAL_BASED_OUTPATIENT_CLINIC_OR_DEPARTMENT_OTHER): Payer: Self-pay | Admitting: Family Medicine

## 2023-04-12 ENCOUNTER — Other Ambulatory Visit: Payer: Self-pay

## 2023-04-12 ENCOUNTER — Other Ambulatory Visit (HOSPITAL_BASED_OUTPATIENT_CLINIC_OR_DEPARTMENT_OTHER): Payer: Self-pay

## 2023-04-12 MED ORDER — LISINOPRIL 10 MG PO TABS
10.0000 mg | ORAL_TABLET | Freq: Every day | ORAL | 1 refills | Status: AC
  Filled 2023-04-12 – 2023-05-18 (×2): qty 90, 90d supply, fill #0
  Filled 2023-10-28: qty 90, 90d supply, fill #1

## 2023-04-12 MED ORDER — GABAPENTIN 300 MG PO CAPS
300.0000 mg | ORAL_CAPSULE | Freq: Three times a day (TID) | ORAL | 1 refills | Status: AC
  Filled 2023-04-12: qty 90, 30d supply, fill #0
  Filled 2023-05-18: qty 90, 30d supply, fill #1

## 2023-04-13 ENCOUNTER — Other Ambulatory Visit (HOSPITAL_BASED_OUTPATIENT_CLINIC_OR_DEPARTMENT_OTHER): Payer: Self-pay | Admitting: Family Medicine

## 2023-04-13 ENCOUNTER — Other Ambulatory Visit (HOSPITAL_BASED_OUTPATIENT_CLINIC_OR_DEPARTMENT_OTHER): Payer: Self-pay

## 2023-04-13 NOTE — Assessment & Plan Note (Addendum)
Patient reports ongoing issues with low back pain with radiation of symptoms into lower extremities.  He has had chronic issues with this and has had imaging completed in the past.  Denies any new numbness or tingling into lower extremities, no saddle anesthesia, no new urinary symptoms.  He has had ongoing urinary tract evaluation, however denies any new symptoms related to this. On exam, no significant tenderness to palpation through spinous processes and lumbar spine, no significant paraspinal muscle tenderness.  Negative straight leg raise bilaterally.  Normal gait in office today.  Normal deep tendon reflexes in bilateral lower extremities. Review of prior imaging indicates that he has had MRI of lumbar spine completed, this was about 6 years ago.  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 Discussed options with patient and family today, they would prefer to proceed with further evaluation with surgical specialist which is reasonable, referral has been placed today.

## 2023-04-14 ENCOUNTER — Other Ambulatory Visit (HOSPITAL_BASED_OUTPATIENT_CLINIC_OR_DEPARTMENT_OTHER): Payer: Self-pay

## 2023-04-14 MED ORDER — DICYCLOMINE HCL 20 MG PO TABS
10.0000 mg | ORAL_TABLET | Freq: Two times a day (BID) | ORAL | 0 refills | Status: DC
  Filled 2023-04-14 – 2023-04-19 (×2): qty 7, 7d supply, fill #0

## 2023-04-19 ENCOUNTER — Other Ambulatory Visit (HOSPITAL_BASED_OUTPATIENT_CLINIC_OR_DEPARTMENT_OTHER): Payer: Self-pay

## 2023-04-20 ENCOUNTER — Other Ambulatory Visit (HOSPITAL_BASED_OUTPATIENT_CLINIC_OR_DEPARTMENT_OTHER): Payer: Self-pay

## 2023-04-26 ENCOUNTER — Ambulatory Visit (INDEPENDENT_AMBULATORY_CARE_PROVIDER_SITE_OTHER): Payer: Medicare Other | Admitting: Family Medicine

## 2023-04-26 ENCOUNTER — Encounter (HOSPITAL_BASED_OUTPATIENT_CLINIC_OR_DEPARTMENT_OTHER): Payer: Self-pay | Admitting: Family Medicine

## 2023-04-26 VITALS — BP 125/76 | HR 79 | Temp 97.7°F | Ht 62.0 in | Wt 181.6 lb

## 2023-04-26 DIAGNOSIS — R42 Dizziness and giddiness: Secondary | ICD-10-CM

## 2023-04-26 LAB — POCT GLUCOSE (DEVICE FOR HOME USE): POC Glucose: 125 mg/dl — AB (ref 70–99)

## 2023-04-26 NOTE — Progress Notes (Signed)
Established Patient Office Visit  Patient ID: Jorge [name], male    DOB: December 23, 1951  Age: 72 y.o. MRN: [id number]  Chief Complaint  Patient presents with   Follow-up    Pt here for hospital f/u, he also stated he need paperwork filled out if possible    Jorge [name] is a 72 yo male patient who presents today for completion of [employer] [facility] paperwork. Daughter is present for translation.   Patient was seen in ED for constipation on 04/08/2023. Denies current issues with constipation, dysuria, urinary frequency/urgency, abdominal pain, N/V, chest pain, shob, and cough. He reports that he has occasional dizziness when he does not change positions. He reports that he feels this sensation when he is sitting and then "comes and it stays still for a minute then goes away."  Denies heart palpitations, visual changes, N/V, syncope, slurred speech, difficulty with speech, and weakness.   Subjective    Review of Systems  Constitutional:  Negative for malaise/fatigue and weight loss.  Respiratory:  Negative for cough and shortness of breath.   Cardiovascular:  Negative for chest pain and palpitations.  Gastrointestinal:  Negative for abdominal pain, nausea and vomiting.  Genitourinary:  Negative for frequency and urgency.  Musculoskeletal:  Negative for myalgias.  Neurological:  Positive for dizziness. Negative for weakness and headaches.     Objective:   BP 125/76 (BP Location: Left Arm, Patient Position: Sitting, Cuff Size: Normal)   Pulse 79   Temp 97.7 F (36.5 C) (Oral)   Ht 5\' 2"  (1.575 m)   Wt 181 lb 9.6 oz (82.4 kg)   SpO2 96%   BMI 33.22 kg/m  BP Readings from Last 3 Encounters:  04/26/23 125/76  04/08/23 (!) 143/85  03/16/23 123/64    Physical Exam Constitutional:      Appearance: Normal appearance.  Eyes:     Extraocular Movements: Extraocular movements intact.     Pupils: Pupils are equal, round, and reactive to light.  Cardiovascular:      Rate and Rhythm: Normal rate and regular rhythm.     Pulses: Normal pulses.     Heart sounds: Normal heart sounds.  Pulmonary:     Effort: Pulmonary effort is normal.     Breath sounds: Normal breath sounds.  Neurological:     General: No focal deficit present.     Mental Status: He is alert.  Psychiatric:        Mood and Affect: Mood normal.        Behavior: Behavior normal.        Thought Content: Thought content normal.        Judgment: Judgment normal.    Assessment & Plan:   1. Dizziness Review of notes and recent labs completed. No concerns for anemia, acute infection, urinary infection, or kidney and liver abnormalities. Reports that this does not occur with position changes. On exam, patient in no acute distress. No focal deficits. Neurological exam normal. EOMs and PERRLA intact. Cardiovascular exam with heart regular rate and rhythm. Normal heart sounds, no murmurs present. No lower extremity edema present. Lungs clear to auscultation bilaterally. No acute concerns present on exam today. Will assess blood glucose level. Patient reports he is not fasting and ate about 2 hours ago. POCT results 125mg /dL. Less likely for dizziness to be related to hypoglycemic events. Advised patient to continue with adequate hydration, consumption of healthy foods, to continue with physical exercise as tolerated, and to change positions slowly due  to anti-hypertensive medications. Advised patient to return to office if symptoms persist or worsen.  - POCT Glucose (Device for Home Use)  Return if symptoms worsen or fail to improve.    Alyson Reedy, FNP

## 2023-04-27 ENCOUNTER — Encounter: Payer: Self-pay | Admitting: Physician Assistant

## 2023-05-03 ENCOUNTER — Telehealth: Payer: Self-pay | Admitting: Family Medicine

## 2023-05-03 NOTE — Telephone Encounter (Signed)
Error

## 2023-05-18 ENCOUNTER — Other Ambulatory Visit (HOSPITAL_BASED_OUTPATIENT_CLINIC_OR_DEPARTMENT_OTHER): Payer: Self-pay | Admitting: Family Medicine

## 2023-05-18 ENCOUNTER — Other Ambulatory Visit (HOSPITAL_BASED_OUTPATIENT_CLINIC_OR_DEPARTMENT_OTHER): Payer: Self-pay

## 2023-05-18 ENCOUNTER — Ambulatory Visit (INDEPENDENT_AMBULATORY_CARE_PROVIDER_SITE_OTHER): Payer: Medicare Other | Admitting: Family Medicine

## 2023-05-18 ENCOUNTER — Encounter (HOSPITAL_BASED_OUTPATIENT_CLINIC_OR_DEPARTMENT_OTHER): Payer: Self-pay | Admitting: Family Medicine

## 2023-05-18 ENCOUNTER — Other Ambulatory Visit: Payer: Self-pay

## 2023-05-18 VITALS — BP 132/65 | HR 56 | Ht 62.0 in | Wt 179.4 lb

## 2023-05-18 DIAGNOSIS — G8929 Other chronic pain: Secondary | ICD-10-CM | POA: Diagnosis not present

## 2023-05-18 DIAGNOSIS — I10 Essential (primary) hypertension: Secondary | ICD-10-CM

## 2023-05-18 DIAGNOSIS — R109 Unspecified abdominal pain: Secondary | ICD-10-CM | POA: Diagnosis not present

## 2023-05-18 MED ORDER — DICYCLOMINE HCL 20 MG PO TABS
10.0000 mg | ORAL_TABLET | Freq: Two times a day (BID) | ORAL | 0 refills | Status: AC
  Filled 2023-05-18: qty 7, 7d supply, fill #0

## 2023-05-18 MED ORDER — OMEPRAZOLE 40 MG PO CPDR
40.0000 mg | DELAYED_RELEASE_CAPSULE | Freq: Every day | ORAL | 0 refills | Status: DC
  Filled 2023-05-18: qty 90, 90d supply, fill #0

## 2023-05-18 MED ORDER — POLYETHYLENE GLYCOL 3350 17 G PO PACK
17.0000 g | PACK | Freq: Every day | ORAL | 1 refills | Status: AC
  Filled 2023-05-18 – 2023-10-28 (×3): qty 14, 14d supply, fill #0

## 2023-05-18 NOTE — Progress Notes (Signed)
    Procedures performed today:    None.  Independent interpretation of notes and tests performed by another provider:   None.  Brief History, Exam, Impression, and Recommendations:    BP 132/65 (BP Location: Left Arm, Patient Position: Sitting, Cuff Size: Normal)   Pulse (!) 56   Ht 5\' 2"  (1.575 m)   Wt 179 lb 6.4 oz (81.4 kg)   BMI 32.81 kg/m   Primary hypertension Blood pressure at goal in office today.  Patient continues with lisinopril, denies any issues with medication at this time.  Denies any missed doses.  No issues with cough. Given good control of blood pressure, can continue with current dose of medication, no changes made today.  Recommend intermittent monitoring of blood pressure at home, DASH diet  Chronic abdominal pain Ongoing issue for patient, he did have recent emergency department evaluation for abdominal discomfort and ultimately diagnosed with constipation.  He has been using MiraLAX to assist with ongoing diarrhea which has been helpful, however he is out of medication.  He indicates that abdominal discomfort is mostly through central abdomen.  He previously was referred to gastroenterology and does have appointment upcoming in a little bit over 1 month. Given progress with use of MiraLAX, can allow for intermittent use of medication, refill sent to pharmacy on file.  Recommend continuing with scheduled evaluation with gastroenterology given ongoing abdominal pain and continued issues with constipation.  Return in about 3 months (around 08/18/2023) for HTN.   ___________________________________________ Monifa Blanchette de [place], MD, ABFM, [facility]

## 2023-05-18 NOTE — Assessment & Plan Note (Signed)
Ongoing issue for patient, he did have recent emergency department evaluation for abdominal discomfort and ultimately diagnosed with constipation.  He has been using MiraLAX to assist with ongoing diarrhea which has been helpful, however he is out of medication.  He indicates that abdominal discomfort is mostly through central abdomen.  He previously was referred to gastroenterology and does have appointment upcoming in a little bit over 1 month. Given progress with use of MiraLAX, can allow for intermittent use of medication, refill sent to pharmacy on file.  Recommend continuing with scheduled evaluation with gastroenterology given ongoing abdominal pain and continued issues with constipation.

## 2023-05-18 NOTE — Assessment & Plan Note (Signed)
Blood pressure at goal in office today.  Patient continues with lisinopril, denies any issues with medication at this time.  Denies any missed doses.  No issues with cough. Given good control of blood pressure, can continue with current dose of medication, no changes made today.  Recommend intermittent monitoring of blood pressure at home, DASH diet

## 2023-06-21 ENCOUNTER — Other Ambulatory Visit (HOSPITAL_BASED_OUTPATIENT_CLINIC_OR_DEPARTMENT_OTHER): Payer: Self-pay

## 2023-06-30 ENCOUNTER — Encounter: Payer: Self-pay | Admitting: Physician Assistant

## 2023-06-30 ENCOUNTER — Other Ambulatory Visit (HOSPITAL_BASED_OUTPATIENT_CLINIC_OR_DEPARTMENT_OTHER): Payer: Self-pay

## 2023-06-30 ENCOUNTER — Ambulatory Visit (INDEPENDENT_AMBULATORY_CARE_PROVIDER_SITE_OTHER): Payer: Medicare Other | Admitting: Physician Assistant

## 2023-06-30 DIAGNOSIS — R109 Unspecified abdominal pain: Secondary | ICD-10-CM

## 2023-06-30 DIAGNOSIS — Z86010 Personal history of colonic polyps: Secondary | ICD-10-CM

## 2023-06-30 DIAGNOSIS — R12 Heartburn: Secondary | ICD-10-CM | POA: Diagnosis not present

## 2023-06-30 MED ORDER — OMEPRAZOLE 40 MG PO CPDR
40.0000 mg | DELAYED_RELEASE_CAPSULE | Freq: Two times a day (BID) | ORAL | 8 refills | Status: DC
  Filled 2023-06-30 – 2023-07-10 (×2): qty 60, 30d supply, fill #0

## 2023-06-30 NOTE — Progress Notes (Signed)
Subjective:    Patient ID: Jorge [name], male    DOB: 1951-09-12, 72 y.o.   MRN: [id number]  HPI  Tamaris is a pleasant 72 year old [place] non-English-speaking male, here with his daughter today who interprets for him.  He is established with Dr. Arnoldo Lenis was last seen in 2018.  He is referred back by his PCP today Dr. De Peru for evaluation of recent abdominal pain. He did undergo colonoscopy in June 2018 with finding of 2 small polyps and nonbleeding internal hemorrhoids.  Path showed 1 tubular adenoma and 1 benign colonic mucosa and he was indicated for follow-up at a 7-year interval in 2025. He had EGD in June 2018 which showed scattered moderate gastritis.  Biopsy showed mild chronic gastritis no H. pylori. He has been having intermittent upper abdominal pain over the past 3 to 4 years apparently that comes and goes.  His daughter says he may do okay for a month or so and then goes through spells where he complains of upper abdominal pain and heartburn which may go on for 1 to 2 weeks gradually improve for a while and then recurs.  He cannot associate this to any particular foods or diet, sometimes says he feels worse if he has an empty stomach.  No associated nausea or vomiting appetite has been okay.  Appetite is off sometimes during these episodes.  He is not on any regular NSAIDs, no EtOH use. He is currently on omeprazole 40 mg once daily and famotidine 20 mg p.o. twice daily. He also has Protonix on his med list at 40 mg once daily and he and his daughter think he may also be taking this better if not certain.  He did have CT of the abdomen pelvis in April 2024 for evaluation of abdominal pain this was negative other than noting a stable 3 mm nodule in the right lower lobe of the lung and evidence of atherosclerosis.  Labs from April 2024 CBC and c-Met both unremarkable, lipase within normal limits.  Review of Systems Pertinent positive and negative review of systems were noted in  the above HPI section.  All other review of systems was otherwise negative.   Outpatient Encounter Medications as of 06/30/2023  Medication Sig   alum & mag hydroxide-simeth (MAALOX MAX) 400-400-40 MG/5ML suspension Take 15 mLs by mouth every 6 (six) hours as needed for indigestion.   benzonatate (TESSALON) 100 MG capsule Take 1 capsule (100 mg total) by mouth every 8 (eight) hours.   diclofenac Sodium (VOLTAREN) 1 % GEL Apply 2 g topically 4 (four) times daily to affected joint.   famotidine (PEPCID) 20 MG tablet Take 1 tablet (20 mg total) by mouth 2 (two) times daily.   gabapentin (NEURONTIN) 300 MG capsule Take 1 capsule (300 mg total) by mouth 3 (three) times daily.   HYDROcodone-acetaminophen (NORCO) 5-325 MG tablet Take 1 tablet by mouth every 6 (six) hours as needed for moderate pain.   lisinopril (ZESTRIL) 10 MG tablet Take 1 tablet (10 mg total) by mouth daily.   ondansetron (ZOFRAN-ODT) 4 MG disintegrating tablet Dissolve 1 tablet under the tongue every 4 hours as needed for nausea/vomit   polyethylene glycol (MIRALAX) 17 g packet Take 17 g by mouth daily.   psyllium (METAMUCIL SMOOTH TEXTURE) 58.6 % powder Take 1 packet by mouth 3 (three) times daily.   senna-docusate (SENOKOT-S) 8.6-50 MG tablet Take 1 tablet by mouth daily.   tamsulosin (FLOMAX) 0.4 MG CAPS capsule Take 1 capsule (  0.4 mg total) by mouth daily.   [DISCONTINUED] omeprazole (PRILOSEC) 40 MG capsule Take 1 capsule (40 mg total) by mouth daily.   [DISCONTINUED] pantoprazole (PROTONIX) 40 MG tablet Take 1 tablet (40 mg total) by mouth 2 (two) times daily before a meal.   dicyclomine (BENTYL) 20 MG tablet Take 0.5 tablets (10 mg total) by mouth 2 (two) times daily for 7 days.   omeprazole (PRILOSEC) 40 MG capsule Take 1 capsule (40 mg total) by mouth 2 (two) times daily before a meal.   No facility-administered encounter medications on file as of 06/30/2023.   No Known Allergies Patient Active Problem List   Diagnosis  Date Noted   Upper extremity pain 03/16/2023   Need for immunization against influenza 02/15/2023   Chronic abdominal pain 11/12/2022   Lesion of bladder 11/12/2022   Primary hypertension 11/12/2022   Abdominal pain, epigastric 05/09/2017   Lumbosacral spondylosis with radiculopathy 12/25/2016   Social History   Socioeconomic History   Marital status: Married    Spouse name: Not on file   Number of children: 7   Years of education: Not on file   Highest education level: Not on file  Occupational History   Not on file  Tobacco Use   Smoking status: Former    Packs/day: 0.25    Years: 40.00    Additional pack years: 0.00    Total pack years: 10.00    Types: Cigarettes    Quit date: 12/17/2011    Years since quitting: 11.5   Smokeless tobacco: Never  Vaping Use   Vaping Use: Never used  Substance and Sexual Activity   Alcohol use: No   Drug use: No   Sexual activity: Yes  Other Topics Concern   Not on file  Social History Narrative   Not on file   Social Determinants of Health   Financial Resource Strain: Patient Declined (05/18/2023)   Overall Financial Resource Strain (CARDIA)    Difficulty of Paying Living Expenses: Patient declined  Food Insecurity: No Food Insecurity (05/18/2023)   Hunger Vital Sign    Worried About Running Out of Food in the Last Year: Never true    Ran Out of Food in the Last Year: Never true  Transportation Needs: No Transportation Needs (05/18/2023)   PRAPARE - Administrator, Civil Service (Medical): No    Lack of Transportation (Non-Medical): No  Physical Activity: Unknown (05/18/2023)   Exercise Vital Sign    Days of Exercise per Week: Patient declined    Minutes of Exercise per Session: 20 min  Recent Concern: Physical Activity - Insufficiently Active (03/23/2023)   Exercise Vital Sign    Days of Exercise per Week: 5 days    Minutes of Exercise per Session: 20 min  Stress: No Stress Concern Present (05/18/2023)   [employer] of Occupational Health - Occupational Stress Questionnaire    Feeling of Stress : Not at all  Social Connections: Unknown (05/18/2023)   Social Connection and Isolation Panel [NHANES]    Frequency of Communication with Friends and Family: More than three times a week    Frequency of Social Gatherings with Friends and Family: Three times a week    Attends Religious Services: Patient declined    Active Member of Clubs or Organizations: No    Attends Banker Meetings: Never    Marital Status: Married  Recent Concern: Social Connections - Moderately Isolated (03/23/2023)   Social Connection and Isolation Panel [NHANES]  Frequency of Communication with Friends and Family: More than three times a week    Frequency of Social Gatherings with Friends and Family: More than three times a week    Attends Religious Services: Never    Database administrator or Organizations: No    Attends Banker Meetings: Never    Marital Status: Married  Catering manager Violence: Not At Risk (03/23/2023)   Humiliation, Afraid, Rape, and Kick questionnaire    Fear of Current or Ex-Partner: No    Emotionally Abused: No    Physically Abused: No    Sexually Abused: No    Mr. Dey family history is not on file.      Objective:    Vitals:   06/30/23 0924  BP: 110/70  Pulse: (!) 55    Physical Exam Well-developed well-nourished  in no acute distress.  Height, Weight,175  BMI 32.08  HEENT; nontraumatic normocephalic, EOMI, PE R LA, sclera anicteric. Oropharynx;not done Neck; supple, no JVD Cardiovascular; regular rate and rhythm with S1-S2, no murmur rub or gallop Pulmonary; Clear bilaterally Abdomen; soft, nontender, nondistended, no palpable mass or hepatosplenomegaly, bowel sounds are active Rectal;not done  Skin; benign exam, no jaundice rash or appreciable lesions Extremities; no clubbing cyanosis or edema skin warm and dry Neuro/Psych; alert and oriented x4,  grossly nonfocal mood and affect appropriate        Assessment & Plan:   #[age] year old [place] male with history of gastritis documented on EGD in June 2018 # chronic active gastritis by biopsy no H. pylori. Patient has been on chronic PPI and currently is taking PPI and H2 blocker. He has not been seen here in several years and comes in today with complaints of at least 3 to 4-year history of intermittent episodes of abdominal pain and what he describes as burning.  The may be asymptomatic for a month or so and then has recurrence of symptoms that may last for 1 to 2 weeks.  This is in spite of taking the higher doses of PPI and H2 blocker.  He is not on any NSAIDs or EtOH. CT abdomen and pelvis - April 2024.  Etiology of his intermittent episodes of pain is not entirely clear, he may have chronic gastropathy, rule out nonacid dyspepsia, rule out gallbladder disease,  #2 hypertension #3.  History of adenomatous colon polyps-up-to-date with colonoscopy will be due June 2025  Plan; patient will be scheduled for upper abdominal ultrasound Will schedule for EGD with Dr. Lavon Paganini.  Procedure was discussed in detail with the patient including indications risk and benefits and he is agreeable to proceed. Will increase omeprazole to 40 mg p.o. twice daily AC breakfast and dinner For now continue famotidine 20 mg p.o. twice daily Stop pantoprazole Further recommendations pending results of above.  Natlie Asfour Oswald Hillock PA-C 06/30/2023   Cc: de Peru, Raymond J, MD

## 2023-06-30 NOTE — Patient Instructions (Signed)
_______________________________________________________  If your blood pressure at your visit was 140/90 or greater, please contact your primary care physician to follow up on this.  _______________________________________________________  If you are age 72 or older, your body mass index should be between 23-30. Your Body mass index is 32.08 kg/m. If this is out of the aforementioned range listed, please consider follow up with your Primary Care Provider.  If you are age 39 or younger, your body mass index should be between 19-25. Your Body mass index is 32.08 kg/m. If this is out of the aformentioned range listed, please consider follow up with your Primary Care Provider.    Increase your Omeprazole to 40 mg twice daily before meals. Continue famotidine 20 mg twice daily. Stop pantoprazole  ________________________________________________________  The [facility] providers would like to encourage you to use [facility] to communicate with providers for non-urgent requests or questions.  Due to long hold times on the telephone, sending your provider a message by [facility] may be a faster and more efficient way to get a response.  Please allow 48 business hours for a response.  Please remember that this is for non-urgent requests.  _______________________________________________________   Bonita Quin have been scheduled for an endoscopy. Please follow written instructions given to you at your visit today.  If you use inhalers (even only as needed), please bring them with you on the day of your procedure.  If you take any of the following medications, they will need to be adjusted prior to your procedure:   DO NOT TAKE 7 DAYS PRIOR TO TEST- Trulicity (dulaglutide) Ozempic, Wegovy (semaglutide) Mounjaro (tirzepatide) Bydureon Bcise (exanatide extended release)  DO NOT TAKE 1 DAY PRIOR TO YOUR TEST Rybelsus (semaglutide) Adlyxin (lixisenatide) Victoza (liraglutide) Byetta  (exanatide) ___________________________________________________________________________     Bonita Quin have been scheduled for an abdominal ultrasound at [facility] (1st floor of hospital) on July 08, 2023 at 9am. Please arrive 30 minutes prior to your appointment for registration. Make certain not to have anything to eat or drink 6 hours prior to your appointment. Should you need to reschedule your appointment, please contact radiology at [phone]. This test typically takes about 30 minutes to perform.   Due to recent changes in healthcare laws, you may see the results of your imaging and laboratory studies on MyChart before your provider has had a chance to review them.  We understand that in some cases there may be results that are confusing or concerning to you. Not all laboratory results come back in the same time frame and the provider may be waiting for multiple results in order to interpret others.  Please give [place] 48 hours in order for your provider to thoroughly review all the results before contacting the office for clarification of your results.    It was a pleasure to see you today!  Thank you for trusting me with your gastrointestinal care!

## 2023-07-06 NOTE — Progress Notes (Signed)
Reviewed and agree with documentation and assessment and plan. K. Veena Casmer Yepiz , MD   

## 2023-07-08 ENCOUNTER — Ambulatory Visit (HOSPITAL_COMMUNITY): Admission: RE | Admit: 2023-07-08 | Payer: Medicare Other | Source: Ambulatory Visit

## 2023-07-10 ENCOUNTER — Other Ambulatory Visit (HOSPITAL_BASED_OUTPATIENT_CLINIC_OR_DEPARTMENT_OTHER): Payer: Self-pay

## 2023-07-12 ENCOUNTER — Other Ambulatory Visit: Payer: Self-pay | Admitting: Internal Medicine

## 2023-07-13 LAB — COMPLETE METABOLIC PANEL WITH GFR
AG Ratio: 1.8 (calc) (ref 1.0–2.5)
ALT: 25 U/L (ref 9–46)
AST: 26 U/L (ref 10–35)
Albumin: 4.5 g/dL (ref 3.6–5.1)
Alkaline phosphatase (APISO): 95 U/L (ref 35–144)
BUN: 12 mg/dL (ref 7–25)
CO2: 22 mmol/L (ref 20–32)
Calcium: 9.5 mg/dL (ref 8.6–10.3)
Chloride: 104 mmol/L (ref 98–110)
Creat: 0.93 mg/dL (ref 0.70–1.28)
Globulin: 2.5 g/dL (calc) (ref 1.9–3.7)
Glucose, Bld: 107 mg/dL — ABNORMAL HIGH (ref 65–99)
Potassium: 4.2 mmol/L (ref 3.5–5.3)
Sodium: 136 mmol/L (ref 135–146)
Total Bilirubin: 0.8 mg/dL (ref 0.2–1.2)
Total Protein: 7 g/dL (ref 6.1–8.1)
eGFR: 87 mL/min/{1.73_m2} (ref >=60)

## 2023-07-13 LAB — LIPID PANEL
Cholesterol: 183 mg/dL (ref <200)
HDL: 28 mg/dL — ABNORMAL LOW (ref >=40)
Non-HDL Cholesterol (Calc): 155 mg/dL (calc) — ABNORMAL HIGH (ref <130)
Total CHOL/HDL Ratio: 6.5 (calc) — ABNORMAL HIGH (ref <5.0)
Triglycerides: 410 mg/dL — ABNORMAL HIGH (ref <150)

## 2023-07-13 LAB — HEMOGLOBIN A1C W/OUT EAG: Hgb A1c MFr Bld: 6.1 % of total Hgb — ABNORMAL HIGH (ref <5.7)

## 2023-07-13 LAB — CBC
HCT: 42.7 % (ref 38.5–50.0)
Hemoglobin: 14.7 g/dL (ref 13.2–17.1)
MCH: 30.9 pg (ref 27.0–33.0)
MCHC: 34.4 g/dL (ref 32.0–36.0)
MCV: 89.7 fL (ref 80.0–100.0)
MPV: 11 fL (ref 7.5–12.5)
Platelets: 173 10*3/uL (ref 140–400)
RBC: 4.76 10*6/uL (ref 4.20–5.80)
RDW: 12.8 % (ref 11.0–15.0)
WBC: 4.8 10*3/uL (ref 3.8–10.8)

## 2023-07-13 LAB — TSH: TSH: 0.74 mIU/L (ref 0.40–4.50)

## 2023-07-13 LAB — PSA: PSA: 2.81 ng/mL (ref <=4.00)

## 2023-07-24 ENCOUNTER — Emergency Department (HOSPITAL_BASED_OUTPATIENT_CLINIC_OR_DEPARTMENT_OTHER): Payer: Medicare Other

## 2023-07-24 ENCOUNTER — Other Ambulatory Visit: Payer: Self-pay

## 2023-07-24 ENCOUNTER — Other Ambulatory Visit (HOSPITAL_BASED_OUTPATIENT_CLINIC_OR_DEPARTMENT_OTHER): Payer: Self-pay

## 2023-07-24 ENCOUNTER — Encounter (HOSPITAL_BASED_OUTPATIENT_CLINIC_OR_DEPARTMENT_OTHER): Payer: Self-pay

## 2023-07-24 ENCOUNTER — Emergency Department (HOSPITAL_BASED_OUTPATIENT_CLINIC_OR_DEPARTMENT_OTHER)
Admission: EM | Admit: 2023-07-24 | Discharge: 2023-07-24 | Disposition: A | Discharge status: Home / Self Care | Payer: Medicare Other | Attending: Emergency Medicine | Admitting: Emergency Medicine

## 2023-07-24 DIAGNOSIS — R001 Bradycardia, unspecified: Secondary | ICD-10-CM | POA: Insufficient documentation

## 2023-07-24 DIAGNOSIS — R1030 Lower abdominal pain, unspecified: Secondary | ICD-10-CM | POA: Insufficient documentation

## 2023-07-24 DIAGNOSIS — R1013 Epigastric pain: Secondary | ICD-10-CM | POA: Diagnosis not present

## 2023-07-24 DIAGNOSIS — Z8719 Personal history of other diseases of the digestive system: Secondary | ICD-10-CM

## 2023-07-24 LAB — CBC WITH DIFFERENTIAL/PLATELET
Abs Immature Granulocytes: 0.01 10*3/uL (ref 0.00–0.07)
Basophils Absolute: 0 10*3/uL (ref 0.0–0.1)
Basophils Relative: 1 %
Eosinophils Absolute: 0.1 10*3/uL (ref 0.0–0.5)
Eosinophils Relative: 3 %
HCT: 40.6 % (ref 39.0–52.0)
Hemoglobin: 14.7 g/dL (ref 13.0–17.0)
Immature Granulocytes: 0 %
Lymphocytes Relative: 44 %
Lymphs Abs: 2.3 10*3/uL (ref 0.7–4.0)
MCH: 31.3 pg (ref 26.0–34.0)
MCHC: 36.2 g/dL — ABNORMAL HIGH (ref 30.0–36.0)
MCV: 86.4 fL (ref 80.0–100.0)
Monocytes Absolute: 0.5 10*3/uL (ref 0.1–1.0)
Monocytes Relative: 9 %
Neutro Abs: 2.2 10*3/uL (ref 1.7–7.7)
Neutrophils Relative %: 43 %
Platelets: 182 10*3/uL (ref 150–400)
RBC: 4.7 MIL/uL (ref 4.22–5.81)
RDW: 12.1 % (ref 11.5–15.5)
WBC: 5.1 10*3/uL (ref 4.0–10.5)
nRBC: 0 % (ref 0.0–0.2)

## 2023-07-24 LAB — COMPREHENSIVE METABOLIC PANEL
ALT: 29 U/L (ref 0–44)
AST: 27 U/L (ref 15–41)
Albumin: 4.5 g/dL (ref 3.5–5.0)
Alkaline Phosphatase: 82 U/L (ref 38–126)
Anion gap: 9 (ref 5–15)
BUN: 10 mg/dL (ref 8–23)
CO2: 26 mmol/L (ref 22–32)
Calcium: 9.6 mg/dL (ref 8.9–10.3)
Chloride: 104 mmol/L (ref 98–111)
Creatinine, Ser: 0.91 mg/dL (ref 0.61–1.24)
GFR, Estimated: >60 mL/min (ref >60)
Glucose, Bld: 110 mg/dL — ABNORMAL HIGH (ref 70–99)
Potassium: 3.5 mmol/L (ref 3.5–5.1)
Sodium: 139 mmol/L (ref 135–145)
Total Bilirubin: 0.9 mg/dL (ref 0.3–1.2)
Total Protein: 7.5 g/dL (ref 6.5–8.1)

## 2023-07-24 LAB — LIPASE, BLOOD: Lipase: <10 U/L — ABNORMAL LOW (ref 11–51)

## 2023-07-24 LAB — TROPONIN I (HIGH SENSITIVITY): Troponin I (High Sensitivity): 9 ng/L (ref <18)

## 2023-07-24 MED ORDER — ONDANSETRON HCL 4 MG/2ML IJ SOLN
4.0000 mg | Freq: Once | INTRAMUSCULAR | Status: AC
  Administered 2023-07-24: 4 mg via INTRAVENOUS
  Filled 2023-07-24: qty 2

## 2023-07-24 MED ORDER — ALUM & MAG HYDROXIDE-SIMETH 200-200-20 MG/5ML PO SUSP
30.0000 mL | Freq: Once | ORAL | Status: AC
  Administered 2023-07-24: 30 mL via ORAL
  Filled 2023-07-24: qty 30

## 2023-07-24 MED ORDER — FAMOTIDINE 20 MG PO TABS: 20.0000 mg | ORAL_TABLET | Freq: Two times a day (BID) | ORAL | 1 refills | Status: DC

## 2023-07-24 MED ORDER — IOHEXOL 300 MG/ML  SOLN
100.0000 mL | Freq: Once | INTRAMUSCULAR | Status: AC | PRN
  Administered 2023-07-24: 100 mL via INTRAVENOUS

## 2023-07-24 MED ORDER — OMEPRAZOLE 20 MG PO CPDR
20.0000 mg | DELAYED_RELEASE_CAPSULE | Freq: Every day | ORAL | 1 refills | Status: DC
  Filled 2023-07-24: qty 30, 30d supply, fill #0
  Filled 2023-08-11: qty 30, 30d supply, fill #1

## 2023-07-24 MED ORDER — LIDOCAINE VISCOUS HCL 2 % MT SOLN
15.0000 mL | Freq: Once | OROMUCOSAL | Status: AC
  Administered 2023-07-24: 15 mL via ORAL
  Filled 2023-07-24: qty 15

## 2023-07-24 MED ORDER — SODIUM CHLORIDE 0.9 % IV BOLUS
1000.0000 mL | Freq: Once | INTRAVENOUS | Status: AC
  Administered 2023-07-24: 1000 mL via INTRAVENOUS

## 2023-07-24 MED ORDER — FAMOTIDINE 20 MG PO TABS
20.0000 mg | ORAL_TABLET | Freq: Two times a day (BID) | ORAL | 1 refills | Status: DC
  Filled 2023-07-24: qty 60, 30d supply, fill #0

## 2023-07-24 MED ORDER — MORPHINE SULFATE (PF) 4 MG/ML IV SOLN
4.0000 mg | Freq: Once | INTRAVENOUS | Status: AC
  Administered 2023-07-24: 4 mg via INTRAVENOUS
  Filled 2023-07-24: qty 1

## 2023-07-24 NOTE — Discharge Instructions (Addendum)
Begin taking Pepcid and Prilosec as prescribed.  Begin taking Maalox as needed for reflux.  This medication is available over-the-counter.  Follow-up with primary doctor if not improving in the next few days.

## 2023-07-24 NOTE — ED Notes (Signed)
Patient transported to CT 

## 2023-07-24 NOTE — ED Notes (Signed)
Patient tachy in the 160's then HR dropped to the 50's. Pt is no c/o nausea. EDP has been notified.

## 2023-07-24 NOTE — ED Notes (Signed)
CT has been called for an update on when he will go for imaging. CT Tech stated he will be next.

## 2023-07-24 NOTE — ED Provider Notes (Signed)
[place] EMERGENCY DEPARTMENT AT [facility] Provider Note   CSN: [id number] Arrival date & time: 07/24/23  0342     History  Chief Complaint  Patient presents with   Gastroesophageal Reflux    Jorge [name] is a 72 y.o. male.  Patient is a 72 year old male presenting with complaints of burning in his abdomen.  He has history of GERD and this feels like a flareup.  He has been off of his medications that he normally takes.  He denies any chest pain, shortness of breath, fevers, or chills.  He denies any vomiting or diarrhea, but does feel nauseated sometimes.  He has been here in the past with similar symptoms and a GI cocktail usually helps.  Patient's speaks Nepali and history and history taken with the assistance of the son at bedside.  The history is provided by the patient.       Home Medications Prior to Admission medications   Medication Sig Start Date End Date Taking? Authorizing Provider  alum & mag hydroxide-simeth (MAALOX MAX) 400-400-40 MG/5ML suspension Take 15 mLs by mouth every 6 (six) hours as needed for indigestion. 04/08/23   Elayne Snare K, DO  benzonatate (TESSALON) 100 MG capsule Take 1 capsule (100 mg total) by mouth every 8 (eight) hours. 02/05/23   Melene Plan, DO  diclofenac Sodium (VOLTAREN) 1 % GEL Apply 2 g topically 4 (four) times daily to affected joint. 03/16/23   de Peru, Buren Kos, MD  dicyclomine (BENTYL) 20 MG tablet Take 0.5 tablets (10 mg total) by mouth 2 (two) times daily for 7 days. 05/18/23 05/25/23  de Peru, Raymond J, MD  famotidine (PEPCID) 20 MG tablet Take 1 tablet (20 mg total) by mouth 2 (two) times daily. 04/08/23   Elayne Snare K, DO  gabapentin (NEURONTIN) 300 MG capsule Take 1 capsule (300 mg total) by mouth 3 (three) times daily. 04/12/23   Novella Olive, FNP  HYDROcodone-acetaminophen (NORCO) 5-325 MG tablet Take 1 tablet by mouth every 6 (six) hours as needed for moderate pain. 02/18/23   McKenzie, Mardene Celeste, MD   lisinopril (ZESTRIL) 10 MG tablet Take 1 tablet (10 mg total) by mouth daily. 04/12/23   Novella Olive, FNP  omeprazole (PRILOSEC) 40 MG capsule Take 1 capsule (40 mg total) by mouth 2 (two) times daily before a meal. 06/30/23   Esterwood, Amy S, PA-C  ondansetron (ZOFRAN-ODT) 4 MG disintegrating tablet Dissolve 1 tablet under the tongue every 4 hours as needed for nausea/vomit 02/05/23   Melene Plan, DO  polyethylene glycol (MIRALAX) 17 g packet Take 17 g by mouth daily. 05/18/23   de Peru, Buren Kos, MD  psyllium (METAMUCIL SMOOTH TEXTURE) 58.6 % powder Take 1 packet by mouth 3 (three) times daily. 04/08/23   Elayne Snare K, DO  senna-docusate (SENOKOT-S) 8.6-50 MG tablet Take 1 tablet by mouth daily. 04/08/23   Elayne Snare K, DO  tamsulosin (FLOMAX) 0.4 MG CAPS capsule Take 1 capsule (0.4 mg total) by mouth daily. 01/27/23   de Peru, Raymond J, MD      Allergies    Patient has no known allergies.    Review of Systems   Review of Systems  All other systems reviewed and are negative.   Physical Exam Updated Vital Signs BP (!) 168/74   Pulse (!) 47   Temp 98.8 F (37.1 C)   Resp 18   Ht 5\' 2"  (1.575 m)   Wt 78 kg   SpO2 99%  BMI 31.45 kg/m  Physical Exam Vitals and nursing note reviewed.  Constitutional:      General: He is not in acute distress.    Appearance: He is well-developed. He is not diaphoretic.  HENT:     Head: Normocephalic and atraumatic.  Cardiovascular:     Rate and Rhythm: Normal rate and regular rhythm.     Heart sounds: No murmur heard.    No friction rub.  Pulmonary:     Effort: Pulmonary effort is normal. No respiratory distress.     Breath sounds: Normal breath sounds. No wheezing or rales.  Abdominal:     General: Bowel sounds are normal. There is no distension.     Palpations: Abdomen is soft.     Tenderness: There is abdominal tenderness. There is no guarding or rebound.     Comments: There is mild epigastric tenderness.  No rebound or  guarding.  Musculoskeletal:        General: Normal range of motion.     Cervical back: Normal range of motion and neck supple.  Skin:    General: Skin is warm and dry.  Neurological:     Mental Status: He is alert and oriented to person, place, and time.     Coordination: Coordination normal.     ED Results / Procedures / Treatments   Labs (all labs ordered are listed, but only abnormal results are displayed) Labs Reviewed - No data to display  EKG EKG Interpretation Date/Time:  Saturday July 24 2023 03:52:34 EDT Ventricular Rate:  47 PR Interval:  179 QRS Duration:  93 QT Interval:  414 QTC Calculation: 366 R Axis:   54  Text Interpretation: Sinus bradycardia Abnormal R-wave progression, early transition Abnormal inferior Q waves No significant change since 02/15/2023 Confirmed by Geoffery Lyons ([id number]) on 07/24/2023 4:10:56 AM  Radiology No results found.  Procedures Procedures    Medications Ordered in ED Medications  alum & mag hydroxide-simeth (MAALOX/MYLANTA) 200-200-20 MG/5ML suspension 30 mL (has no administration in time range)    And  lidocaine (XYLOCAINE) 2 % viscous mouth solution 15 mL (has no administration in time range)    ED Course/ Medical Decision Making/ A&P  Patient presenting with complaints of abdominal pain as described in the HPI.  He describes this as a burning sensation that is consistent with his symptoms of reflux.  Patient arrives with stable vital signs and is afebrile.  Physical examination reveals epigastric tenderness, but no peritoneal signs.  Laboratory studies obtained including CBC, CMP, lipase, and troponin.  All studies unremarkable.  CT scan of the abdomen and pelvis shows slightly dilated mid to lower abdominal bowel segments with ileus versus early small bowel obstruction is the differential.  Patient has been having regular bowel movements and small bowel obstruction does not fit the clinical picture.  Patient given normal  saline along with a GI cocktail, however did not get much relief.  Patient given morphine and seems to be feeling better.  At this point I feel as though patient can safely be discharged.  I suspect symptoms are reflux related.  To follow-up as needed.  Final Clinical Impression(s) / ED Diagnoses Final diagnoses:  None    Rx / DC Orders ED Discharge Orders     None         Geoffery Lyons, MD 07/24/23 [id number]

## 2023-07-24 NOTE — ED Notes (Signed)
All appropriate discharge materials reviewed at length with patient. Time for questions provided. Pt has no other questions at this time and verbalizes understanding of all provided materials.  

## 2023-07-24 NOTE — ED Triage Notes (Addendum)
POV from home, A&O x 4, GCS 15, amb to room  Couple days of upper epigastric pain, burning in nature, hx of same, prescribed meds but no longer takes them (prilosec, maalox, pepsid)  Son translating

## 2023-08-02 ENCOUNTER — Other Ambulatory Visit (HOSPITAL_BASED_OUTPATIENT_CLINIC_OR_DEPARTMENT_OTHER): Payer: Self-pay

## 2023-08-11 ENCOUNTER — Other Ambulatory Visit (HOSPITAL_BASED_OUTPATIENT_CLINIC_OR_DEPARTMENT_OTHER): Payer: Self-pay

## 2023-08-13 ENCOUNTER — Other Ambulatory Visit (HOSPITAL_BASED_OUTPATIENT_CLINIC_OR_DEPARTMENT_OTHER): Payer: Self-pay

## 2023-08-18 ENCOUNTER — Ambulatory Visit (HOSPITAL_BASED_OUTPATIENT_CLINIC_OR_DEPARTMENT_OTHER): Payer: Medicare Other | Admitting: Family Medicine

## 2023-08-21 ENCOUNTER — Encounter (HOSPITAL_BASED_OUTPATIENT_CLINIC_OR_DEPARTMENT_OTHER): Payer: Self-pay | Admitting: Emergency Medicine

## 2023-08-21 ENCOUNTER — Emergency Department (HOSPITAL_BASED_OUTPATIENT_CLINIC_OR_DEPARTMENT_OTHER)
Admission: EM | Admit: 2023-08-21 | Discharge: 2023-08-21 | Disposition: A | Discharge status: Home / Self Care | Payer: Medicare Other | Attending: Emergency Medicine | Admitting: Emergency Medicine

## 2023-08-21 ENCOUNTER — Emergency Department (HOSPITAL_BASED_OUTPATIENT_CLINIC_OR_DEPARTMENT_OTHER): Payer: Medicare Other

## 2023-08-21 ENCOUNTER — Other Ambulatory Visit: Payer: Self-pay

## 2023-08-21 DIAGNOSIS — E119 Type 2 diabetes mellitus without complications: Secondary | ICD-10-CM | POA: Insufficient documentation

## 2023-08-21 DIAGNOSIS — R110 Nausea: Secondary | ICD-10-CM | POA: Diagnosis not present

## 2023-08-21 DIAGNOSIS — R1011 Right upper quadrant pain: Secondary | ICD-10-CM | POA: Diagnosis present

## 2023-08-21 DIAGNOSIS — I10 Essential (primary) hypertension: Secondary | ICD-10-CM | POA: Insufficient documentation

## 2023-08-21 LAB — URINALYSIS, ROUTINE W REFLEX MICROSCOPIC
Bilirubin Urine: NEGATIVE
Glucose, UA: NEGATIVE mg/dL
Hgb urine dipstick: NEGATIVE
Ketones, ur: NEGATIVE mg/dL
Leukocytes,Ua: NEGATIVE
Nitrite: NEGATIVE
Protein, ur: NEGATIVE mg/dL
Specific Gravity, Urine: 1.005 (ref 1.005–1.030)
pH: 6.5 (ref 5.0–8.0)

## 2023-08-21 LAB — COMPREHENSIVE METABOLIC PANEL
ALT: 22 U/L (ref 0–44)
AST: 23 U/L (ref 15–41)
Albumin: 4.1 g/dL (ref 3.5–5.0)
Alkaline Phosphatase: 75 U/L (ref 38–126)
Anion gap: 8 (ref 5–15)
BUN: 10 mg/dL (ref 8–23)
CO2: 24 mmol/L (ref 22–32)
Calcium: 9 mg/dL (ref 8.9–10.3)
Chloride: 108 mmol/L (ref 98–111)
Creatinine, Ser: 0.85 mg/dL (ref 0.61–1.24)
GFR, Estimated: >60 mL/min (ref >60)
Glucose, Bld: 107 mg/dL — ABNORMAL HIGH (ref 70–99)
Potassium: 3.4 mmol/L — ABNORMAL LOW (ref 3.5–5.1)
Sodium: 140 mmol/L (ref 135–145)
Total Bilirubin: 0.6 mg/dL (ref 0.3–1.2)
Total Protein: 6.5 g/dL (ref 6.5–8.1)

## 2023-08-21 LAB — CBC WITH DIFFERENTIAL/PLATELET
Abs Immature Granulocytes: 0 10*3/uL (ref 0.00–0.07)
Basophils Absolute: 0 10*3/uL (ref 0.0–0.1)
Basophils Relative: 1 %
Eosinophils Absolute: 0.1 10*3/uL (ref 0.0–0.5)
Eosinophils Relative: 3 %
HCT: 37.5 % — ABNORMAL LOW (ref 39.0–52.0)
Hemoglobin: 13.3 g/dL (ref 13.0–17.0)
Immature Granulocytes: 0 %
Lymphocytes Relative: 47 %
Lymphs Abs: 1.5 10*3/uL (ref 0.7–4.0)
MCH: 30.9 pg (ref 26.0–34.0)
MCHC: 35.5 g/dL (ref 30.0–36.0)
MCV: 87.2 fL (ref 80.0–100.0)
Monocytes Absolute: 0.3 10*3/uL (ref 0.1–1.0)
Monocytes Relative: 10 %
Neutro Abs: 1.3 10*3/uL — ABNORMAL LOW (ref 1.7–7.7)
Neutrophils Relative %: 39 %
Platelets: 154 10*3/uL (ref 150–400)
RBC: 4.3 MIL/uL (ref 4.22–5.81)
RDW: 12 % (ref 11.5–15.5)
WBC: 3.3 10*3/uL — ABNORMAL LOW (ref 4.0–10.5)
nRBC: 0 % (ref 0.0–0.2)

## 2023-08-21 LAB — LIPASE, BLOOD: Lipase: <10 U/L — ABNORMAL LOW (ref 11–51)

## 2023-08-21 MED ORDER — ONDANSETRON HCL 4 MG/2ML IJ SOLN
4.0000 mg | Freq: Once | INTRAMUSCULAR | Status: AC
  Administered 2023-08-21: 4 mg via INTRAVENOUS
  Filled 2023-08-21: qty 2

## 2023-08-21 MED ORDER — MORPHINE SULFATE (PF) 4 MG/ML IV SOLN
4.0000 mg | Freq: Once | INTRAVENOUS | Status: AC
  Administered 2023-08-21: 4 mg via INTRAVENOUS
  Filled 2023-08-21: qty 1

## 2023-08-21 MED ORDER — IOHEXOL 300 MG/ML  SOLN
100.0000 mL | Freq: Once | INTRAMUSCULAR | Status: AC | PRN
  Administered 2023-08-21: 100 mL via INTRAVENOUS

## 2023-08-21 NOTE — Discharge Instructions (Addendum)
You were seen today for abdominal pain.  Your workup today is largely unremarkable.  Continue omeprazole at home.  If you have new or worsening symptoms, you should be reevaluated.  If you have ongoing symptoms, you may need to see gastroenterology.

## 2023-08-21 NOTE — ED Triage Notes (Signed)
Pt in with son who translates, reports sharp RUQ pain that began 24hrs ago. +nausea reported and RUQ tenderness

## 2023-08-21 NOTE — ED Notes (Signed)
Pt to CT

## 2023-08-21 NOTE — ED Notes (Signed)
Pt tolerated PO challenge; EDP made aware 

## 2023-08-21 NOTE — ED Provider Notes (Signed)
[place] [facility] AT [facility] Provider Note   CSN: [id number] Arrival date & time: 08/21/23  0234     History  Chief Complaint  Patient presents with   Abdominal Pain    Jorge [name] is a 72 y.o. male.  HPI     This is a 72 year old male who presents with right upper quadrant abdominal pain.  History of reflux.  Pain started last night.  It has been worsened throughout the night.  He has nauseated.  No vomiting.  Patient denies change in bowel movements.  Patient was seen and evaluated in early July for the same.  He was restarted on his omeprazole.  He is due for an endoscopy in September.  Patient's son at bedside helps with translation.  Home Medications Prior to Admission medications   Medication Sig Start Date End Date Taking? Authorizing Provider  alum & mag hydroxide-simeth (MAALOX MAX) 400-400-40 MG/5ML suspension Take 15 mLs by mouth every 6 (six) hours as needed for indigestion. 04/08/23   Elayne Snare K, DO  benzonatate (TESSALON) 100 MG capsule Take 1 capsule (100 mg total) by mouth every 8 (eight) hours. 02/05/23   Melene Plan, DO  diclofenac Sodium (VOLTAREN) 1 % GEL Apply 2 g topically 4 (four) times daily to affected joint. 03/16/23   [place], Buren Kos, MD  dicyclomine (BENTYL) 20 MG tablet Take 0.5 tablets (10 mg total) by mouth 2 (two) times daily for 7 days. 05/18/23 05/25/23  [place], Raymond J, MD  famotidine (PEPCID) 20 MG tablet Take 1 tablet (20 mg total) by mouth 2 (two) times daily. 07/24/23   Geoffery Lyons, MD  gabapentin (NEURONTIN) 300 MG capsule Take 1 capsule (300 mg total) by mouth 3 (three) times daily. 04/12/23   Novella Olive, FNP  HYDROcodone-acetaminophen (NORCO) 5-325 MG tablet Take 1 tablet by mouth every 6 (six) hours as needed for moderate pain. 02/18/23   McKenzie, Mardene Celeste, MD  lisinopril (ZESTRIL) 10 MG tablet Take 1 tablet (10 mg total) by mouth daily. 04/12/23   Novella Olive, FNP  omeprazole (PRILOSEC) 20  MG capsule Take 1 capsule (20 mg total) by mouth daily. 07/24/23   Geoffery Lyons, MD  ondansetron (ZOFRAN-ODT) 4 MG disintegrating tablet Dissolve 1 tablet under the tongue every 4 hours as needed for nausea/vomit 02/05/23   Melene Plan, DO  polyethylene glycol (MIRALAX) 17 g packet Take 17 g by mouth daily. 05/18/23   [place], Buren Kos, MD  psyllium (METAMUCIL SMOOTH TEXTURE) 58.6 % powder Take 1 packet by mouth 3 (three) times daily. 04/08/23   Elayne Snare K, DO  senna-docusate (SENOKOT-S) 8.6-50 MG tablet Take 1 tablet by mouth daily. 04/08/23   Elayne Snare K, DO  tamsulosin (FLOMAX) 0.4 MG CAPS capsule Take 1 capsule (0.4 mg total) by mouth daily. 01/27/23   [place], Raymond J, MD      Allergies    Patient has no known allergies.    Review of Systems   Review of Systems  Constitutional:  Negative for fever.  Respiratory:  Negative for shortness of breath.   Cardiovascular:  Negative for chest pain.  Gastrointestinal:  Positive for abdominal pain and nausea. Negative for constipation, diarrhea and vomiting.  All other systems reviewed and are negative.   Physical Exam Updated Vital Signs BP 137/66   Pulse (!) 56   Temp 98.8 F (37.1 C) (Oral)   Resp 13   Ht 1.575 m (5\' 2" )   Wt  77.1 kg   SpO2 92%   BMI 31.09 kg/m  Physical Exam Vitals and nursing note reviewed.  Constitutional:      Appearance: He is well-developed.  HENT:     Head: Normocephalic and atraumatic.  Eyes:     Pupils: Pupils are equal, round, and reactive to light.  Cardiovascular:     Rate and Rhythm: Normal rate and regular rhythm.     Heart sounds: Normal heart sounds. No murmur heard. Pulmonary:     Effort: Pulmonary effort is normal. No respiratory distress.     Breath sounds: Normal breath sounds. No wheezing.  Abdominal:     General: Bowel sounds are normal.     Palpations: Abdomen is soft.     Tenderness: There is abdominal tenderness in the right upper quadrant and epigastric area.  There is no guarding or rebound.  Musculoskeletal:     Cervical back: Neck supple.  Lymphadenopathy:     Cervical: No cervical adenopathy.  Skin:    General: Skin is warm and dry.  Neurological:     Mental Status: He is alert and oriented to person, place, and time.  Psychiatric:        Mood and Affect: Mood normal.     ED Results / Procedures / Treatments   Labs (all labs ordered are listed, but only abnormal results are displayed) Labs Reviewed  CBC WITH DIFFERENTIAL/PLATELET - Abnormal; Notable for the following components:      Result Value   WBC 3.3 (*)    HCT 37.5 (*)    Neutro Abs 1.3 (*)    All other components within normal limits  COMPREHENSIVE METABOLIC PANEL - Abnormal; Notable for the following components:   Potassium 3.4 (*)    Glucose, Bld 107 (*)    All other components within normal limits  LIPASE, BLOOD - Abnormal; Notable for the following components:   Lipase <10 (*)    All other components within normal limits  URINALYSIS, ROUTINE W REFLEX MICROSCOPIC - Abnormal; Notable for the following components:   Color, Urine COLORLESS (*)    All other components within normal limits    EKG None  Radiology CT ABDOMEN PELVIS W CONTRAST  Result Date: 08/21/2023 CLINICAL DATA:  Right upper quadrant pain. EXAM: CT ABDOMEN AND PELVIS WITH CONTRAST TECHNIQUE: Multidetector CT imaging of the abdomen and pelvis was performed using the standard protocol following bolus administration of intravenous contrast. RADIATION DOSE REDUCTION: This exam was performed according to the departmental dose-optimization program which includes automated exposure control, adjustment of the mA and/or kV according to patient size and/or use of iterative reconstruction technique. CONTRAST:  OMNIPAQUE IOHEXOL 300 MG/ML  SOLN COMPARISON:  Numerous prior CTs back to 2017. The 2 most recent are both with contrast dated 07/24/2023 and 04/08/2023. 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 The lung bases are clear of focal infiltrate. Mild cardiomegaly is unchanged. Hepatobiliary: Mild hepatic steatosis. 9 mm cyst is again noted in hepatic segment 3. There is no mass enhancement. Gallbladder and bile ducts are unremarkable. Pancreas: Partially atrophic.  Otherwise unremarkable. Spleen: No abnormality. Adrenals/Urinary Tract: Adrenal glands are unremarkable. 6 mm hypodensity inferiorly in the right kidney is too small to characterize but compatible with a Bosniak 2 cyst, with no follow-up imaging required. Otherwise the kidneys are normal, without renal calculi, focal lesion, or hydronephrosis. Bladder is unremarkable. Stomach/Bowel: No  dilatation or wall thickening including the appendix. Scattered diverticulosis from the distal transverse colon through the sigmoid without evidence of acute diverticulitis. Vascular/Lymphatic: Aortic atherosclerosis. No enlarged abdominal or pelvic lymph nodes. Reproductive: Enlarged prostate, 4.7 cm transverse. Mild bladder base impression. Other: No abdominal wall hernia or abnormality. No abdominopelvic ascites. Musculoskeletal: L4-5 laminectomy again noted. Degenerative change thoracic and lumbar spine. No acute or other significant osseous findings. IMPRESSION: 1. No acute CT findings within the abdomen or pelvis. No findings to account for the patient's symptoms. 2. Mild hepatic steatosis. 3. Aortic atherosclerosis. 4. Prostatomegaly. 5. Colonic diverticulosis without evidence of acute diverticulitis. Aortic Atherosclerosis (ICD10-I70.0). Electronically Signed   By: Almira Bar M.D.   On: 08/21/2023 04:53    Procedures Procedures    Medications Ordered in ED Medications  morphine (PF) 4 MG/ML injection 4 mg (4 mg Intravenous Given 08/21/23 0326)  ondansetron (ZOFRAN) injection 4 mg (4 mg Intravenous Given 08/21/23 0326)  iohexol  (OMNIPAQUE) 300 MG/ML solution 100 mL (100 mLs Intravenous Contrast Given 08/21/23 0409)    ED Course/ Medical Decision Making/ A&P                                 Medical Decision Making Amount and/or Complexity of Data Reviewed Labs: ordered. Radiology: ordered.  Risk Prescription drug management.   This patient presents to the ED for concern of abdominal pain, this involves an extensive number of treatment options, and is a complaint that carries with it a high risk of complications and morbidity.  I considered the following differential and admission for this acute, potentially life threatening condition.  The differential diagnosis includes gastritis, gastroenteritis, cholecystitis, pancreatitis  MDM:    This is a 72 year old male who presents with abdominal pain.  He is overall nontoxic and vital signs are reassuring.  He does have tenderness on exam.  Labs obtained and reviewed.  Normal LFTs.  Normal lipase.  No significant leukocytosis.  Patient had persistent pain in the emergency department.  While I feel this is likely related to his known reflux, will obtain CT imaging to rule out pathologic cause.  CT imaging is largely reassuring.  He does have an endoscopy scheduled in September.  Patient had some relief of symptoms after ongoing pain medicine.  He was able to tolerate fluids without difficulty.  Recommend follow-up with gastroenterology and continuing omeprazole.  (Labs, imaging, consults)  Labs: I Ordered, and personally interpreted labs.  The pertinent results include: CBC, CMP, lipase  Imaging Studies ordered: I ordered imaging studies including CT abdomen pelvis I independently visualized and interpreted imaging. I agree with the radiologist interpretation  Additional history obtained from chart review.  External records from outside source obtained and reviewed including prior evaluations including gastroenterology records  Cardiac Monitoring: The patient was  maintained on a cardiac monitor.  If on the cardiac monitor, I personally viewed and interpreted the cardiac monitored which showed an underlying rhythm of: Sinus rhythm  Reevaluation: After the interventions noted above, I reevaluated the patient and found that they have :improved  Social Determinants of Health:  lives independently  Disposition: Discharge  Co morbidities that complicate the patient evaluation  Past Medical History:  Diagnosis Date   Acid reflux    Arthritis    right leg   Cataract    bil removed   Diabetes (HCC)    Hypertension      Medicines Meds ordered this encounter  Medications   morphine (PF) 4 MG/ML injection 4 mg   ondansetron (ZOFRAN) injection 4 mg   iohexol (OMNIPAQUE) 300 MG/ML solution 100 mL    I have reviewed the patients home medicines and have made adjustments as needed  Problem List / ED Course: Problem List Items Addressed This Visit   None Visit Diagnoses     Right upper quadrant abdominal pain    -  Primary                   Final Clinical Impression(s) / ED Diagnoses Final diagnoses:  Right upper quadrant abdominal pain    Rx / DC Orders ED Discharge Orders     None         Shon Baton, MD 08/21/23 [phone]

## 2023-08-25 ENCOUNTER — Other Ambulatory Visit (HOSPITAL_BASED_OUTPATIENT_CLINIC_OR_DEPARTMENT_OTHER): Payer: Self-pay

## 2023-08-25 ENCOUNTER — Other Ambulatory Visit: Payer: Self-pay

## 2023-09-01 ENCOUNTER — Ambulatory Visit (AMBULATORY_SURGERY_CENTER): Payer: Medicare Other | Admitting: Gastroenterology

## 2023-09-01 ENCOUNTER — Encounter: Payer: Self-pay | Admitting: Gastroenterology

## 2023-09-01 VITALS — BP 115/62 | HR 50 | Temp 98.4°F | Resp 17 | Ht 62.0 in | Wt 175.0 lb

## 2023-09-01 DIAGNOSIS — K219 Gastro-esophageal reflux disease without esophagitis: Secondary | ICD-10-CM | POA: Diagnosis not present

## 2023-09-01 DIAGNOSIS — K2970 Gastritis, unspecified, without bleeding: Secondary | ICD-10-CM | POA: Diagnosis not present

## 2023-09-01 DIAGNOSIS — R12 Heartburn: Secondary | ICD-10-CM

## 2023-09-01 DIAGNOSIS — G8929 Other chronic pain: Secondary | ICD-10-CM

## 2023-09-01 DIAGNOSIS — R1013 Epigastric pain: Secondary | ICD-10-CM | POA: Diagnosis not present

## 2023-09-01 MED ORDER — SODIUM CHLORIDE 0.9 % IV SOLN: 500.0000 mL | Freq: Once | INTRAVENOUS | Status: DC

## 2023-09-01 MED ORDER — PANTOPRAZOLE SODIUM 40 MG PO TBEC: 40.0000 mg | DELAYED_RELEASE_TABLET | Freq: Every day | ORAL | 3 refills | Status: DC

## 2023-09-01 MED ORDER — FAMOTIDINE 20 MG PO TABS: 20.0000 mg | ORAL_TABLET | Freq: Every day | ORAL | 3 refills | Status: DC

## 2023-09-01 NOTE — Progress Notes (Signed)
Called to room to assist during endoscopic procedure.  Patient ID and intended procedure confirmed with present staff. Received instructions for my participation in the procedure from the performing physician.  

## 2023-09-01 NOTE — Op Note (Signed)
[facility] Patient Name: Jorge [name] Procedure Date: 09/01/2023 9:12 AM MRN: [id number] Endoscopist: Napoleon Form , MD, [id number] Age: 72 Referring MD:  Date of Birth: 06-10-1951 Gender: Male Account #: [id number] Procedure:                Upper GI endoscopy Indications:              Dysphagia, Epigastric abdominal pain, Esophageal                            reflux symptoms that persist despite appropriate                            therapy Medicines:                Monitored Anesthesia Care Procedure:                Pre-Anesthesia Assessment:                           - Prior to the procedure, a History and Physical                            was performed, and patient medications and                            allergies were reviewed. The patient's tolerance of                            previous anesthesia was also reviewed. The risks                            and benefits of the procedure and the sedation                            options and risks were discussed with the patient.                            All questions were answered, and informed consent                            was obtained. Prior Anticoagulants: The patient has                            taken no anticoagulant or antiplatelet agents. ASA                            Grade Assessment: II - A patient with mild systemic                            disease. After reviewing the risks and benefits,                            the patient was deemed in satisfactory condition to  undergo the procedure.                           After obtaining informed consent, the endoscope was                            passed under direct vision. Throughout the                            procedure, the patient's blood pressure, pulse, and                            oxygen saturations were monitored continuously. The                            Olympus Scope [id number] was introduced through the                             mouth, and advanced to the second part of duodenum.                            The upper GI endoscopy was accomplished without                            difficulty. The patient tolerated the procedure                            well. Scope In: Scope Out: Findings:                 The Z-line was regular and was found 36 cm from the                            incisors.                           No gross lesions were noted in the entire esophagus.                           No endoscopic abnormality was evident in the                            esophagus to explain the patient's complaint of                            dysphagia.                           Patchy mild inflammation characterized by                            congestion (edema), erythema and friability was                            found in the entire examined stomach. Biopsies were  taken with a cold forceps for histology. Biopsies                            were taken with a cold forceps for Helicobacter                            pylori testing.                           The cardia and gastric fundus were normal on                            retroflexion.                           The examined duodenum was normal. Complications:            No immediate complications. Estimated Blood Loss:     Estimated blood loss was minimal. Impression:               - Z-line regular, 36 cm from the incisors.                           - No gross lesions in the entire esophagus.                           - No endoscopic esophageal abnormality to explain                            patient's dysphagia.                           - Gastritis. Biopsied.                           - Normal examined duodenum. Recommendation:           - Resume previous diet.                           - Continue present medications.                           - Await pathology results.                           -  Follow an antireflux regimen.                           - Use Protonix (pantoprazole) 40 mg PO daily. Rx                            for 90 days with 3 refills                           - Use Pepcid (famotidine) 20 mg PO daily at  bedtime. Rx for 90 days with 3 refills Napoleon Form, MD 09/01/2023 9:41:22 AM This report has been signed electronically.

## 2023-09-01 NOTE — Progress Notes (Signed)
[facility] History and Physical   Primary Care Physician:  Fleet Contras, MD   Reason for Procedure:  GERD, epigastric pain  Plan:    EGD and colonoscopy with possible interventions as needed     HPI: Jorge [name] is a very pleasant 72 y.o. male here for EGD for heartburn persistent despite PPI, epigastric pain.   The risks and benefits as well as alternatives of endoscopic procedure(s) have been discussed and reviewed. All questions answered. The patient agrees to proceed.    Past Medical History:  Diagnosis Date   Acid reflux    Arthritis    right leg   Cataract    bil removed   Diabetes (HCC)    Hypertension     Past Surgical History:  Procedure Laterality Date   cataracts Bilateral    CYSTOSCOPY N/A 02/18/2023   Procedure: CYSTOSCOPY;  Surgeon: Malen Gauze, MD;  Location: [facility];  Service: Urology;  Laterality: N/A;   LUMBAR LAMINECTOMY/DECOMPRESSION MICRODISCECTOMY Right 12/25/2016   Procedure: Lumbar three-five Laminectomy, Right Lumbar three-four, Lumbar four-five Diskectomy ;  Surgeon: Loura Halt Ditty, MD;  Location: [facility] OR;  Service: Neurosurgery;  Laterality: Right;   TRANSURETHRAL RESECTION OF BLADDER TUMOR N/A 02/18/2023   Procedure: TRANSURETHRAL RESECTION OF BLADDER TUMOR (TURBT)- prostatic urethra biopsy;  Surgeon: Malen Gauze, MD;  Location: [facility];  Service: Urology;  Laterality: N/A;    Prior to Admission medications   Medication Sig Start Date End Date Taking? Authorizing Provider  chlorhexidine (PERIDEX) 0.12 % solution SMARTSIG:By Mouth 08/02/23  Yes [provider]  famotidine (PEPCID) 20 MG tablet Take 1 tablet (20 mg total) by mouth 2 (two) times daily. 07/24/23  Yes Delo, Riley Lam, MD  lisinopril (ZESTRIL) 10 MG tablet Take 1 tablet (10 mg total) by mouth daily. 04/12/23  Yes Novella Olive, FNP  omeprazole (PRILOSEC) 20 MG capsule Take 1 capsule (20 mg total) by mouth daily. 07/24/23  Yes Delo, Riley Lam, MD   ondansetron (ZOFRAN-ODT) 4 MG disintegrating tablet Dissolve 1 tablet under the tongue every 4 hours as needed for nausea/vomit 02/05/23  Yes Melene Plan, DO  psyllium (METAMUCIL SMOOTH TEXTURE) 58.6 % powder Take 1 packet by mouth 3 (three) times daily. 04/08/23  Yes Theresia Lo, Benetta Spar K, DO  tamsulosin (FLOMAX) 0.4 MG CAPS capsule Take 1 capsule (0.4 mg total) by mouth daily. 01/27/23  Yes de [place], Raymond J, MD  alum & mag hydroxide-simeth (MAALOX MAX) 400-400-40 MG/5ML suspension Take 15 mLs by mouth every 6 (six) hours as needed for indigestion. 04/08/23   Elayne Snare K, DO  benzonatate (TESSALON) 100 MG capsule Take 1 capsule (100 mg total) by mouth every 8 (eight) hours. 02/05/23   Melene Plan, DO  diclofenac Sodium (VOLTAREN) 1 % GEL Apply 2 g topically 4 (four) times daily to affected joint. 03/16/23   de [place], Buren Kos, MD  dicyclomine (BENTYL) 20 MG tablet Take 0.5 tablets (10 mg total) by mouth 2 (two) times daily for 7 days. 05/18/23 05/25/23  de [place], Raymond J, MD  gabapentin (NEURONTIN) 300 MG capsule Take 1 capsule (300 mg total) by mouth 3 (three) times daily. 04/12/23   Novella Olive, FNP  HYDROcodone-acetaminophen (NORCO) 5-325 MG tablet Take 1 tablet by mouth every 6 (six) hours as needed for moderate pain. 02/18/23   McKenzie, Mardene Celeste, MD  polyethylene glycol (MIRALAX) 17 g packet Take 17 g by mouth daily. Patient not taking: Reported on 09/01/2023 05/18/23   de [place], Buren Kos,  MD  senna-docusate (SENOKOT-S) 8.6-50 MG tablet Take 1 tablet by mouth daily. Patient not taking: Reported on 09/01/2023 04/08/23   Rexford Maus, DO    Current Outpatient Medications  Medication Sig Dispense Refill   chlorhexidine (PERIDEX) 0.12 % solution SMARTSIG:By Mouth     famotidine (PEPCID) 20 MG tablet Take 1 tablet (20 mg total) by mouth 2 (two) times daily. 60 tablet 1   lisinopril (ZESTRIL) 10 MG tablet Take 1 tablet (10 mg total) by mouth daily. 90 tablet 1   omeprazole (PRILOSEC) 20 MG  capsule Take 1 capsule (20 mg total) by mouth daily. 30 capsule 1   ondansetron (ZOFRAN-ODT) 4 MG disintegrating tablet Dissolve 1 tablet under the tongue every 4 hours as needed for nausea/vomit 20 tablet 0   psyllium (METAMUCIL SMOOTH TEXTURE) 58.6 % powder Take 1 packet by mouth 3 (three) times daily. 283 g 12   tamsulosin (FLOMAX) 0.4 MG CAPS capsule Take 1 capsule (0.4 mg total) by mouth daily. 30 capsule 2   alum & mag hydroxide-simeth (MAALOX MAX) 400-400-40 MG/5ML suspension Take 15 mLs by mouth every 6 (six) hours as needed for indigestion. 355 mL 0   benzonatate (TESSALON) 100 MG capsule Take 1 capsule (100 mg total) by mouth every 8 (eight) hours. 21 capsule 0   diclofenac Sodium (VOLTAREN) 1 % GEL Apply 2 g topically 4 (four) times daily to affected joint. 100 g 1   dicyclomine (BENTYL) 20 MG tablet Take 0.5 tablets (10 mg total) by mouth 2 (two) times daily for 7 days. 7 tablet 0   gabapentin (NEURONTIN) 300 MG capsule Take 1 capsule (300 mg total) by mouth 3 (three) times daily. 90 capsule 1   HYDROcodone-acetaminophen (NORCO) 5-325 MG tablet Take 1 tablet by mouth every 6 (six) hours as needed for moderate pain. 30 tablet 0   polyethylene glycol (MIRALAX) 17 g packet Take 17 g by mouth daily. (Patient not taking: Reported on 09/01/2023) 14 each 1   senna-docusate (SENOKOT-S) 8.6-50 MG tablet Take 1 tablet by mouth daily. (Patient not taking: Reported on 09/01/2023) 100 tablet 0   Current Facility-Administered Medications  Medication Dose Route Frequency Provider Last Rate Last Admin   0.9 %  sodium chloride infusion  500 mL Intravenous Once Napoleon Form, MD        Allergies as of 09/01/2023   (No Known Allergies)    Family History  Problem Relation Age of Onset   Colon cancer Neg Hx    Esophageal cancer Neg Hx    Pancreatic cancer Neg Hx    Prostate cancer Neg Hx    Rectal cancer Neg Hx    Stomach cancer Neg Hx     Social History   Socioeconomic History   Marital  status: Married    Spouse name: Not on file   Number of children: 7   Years of education: Not on file   Highest education level: Not on file  Occupational History   Not on file  Tobacco Use   Smoking status: Former    Current packs/day: 0.00    Average packs/day: 0.3 packs/day for 40.0 years (10.0 ttl pk-yrs)    Types: Cigarettes    Start date: 12/17/1971    Quit date: 12/17/2011    Years since quitting: 11.7   Smokeless tobacco: Never  Vaping Use   Vaping status: Never Used  Substance and Sexual Activity   Alcohol use: No   Drug use: No   Sexual activity: Yes  Other Topics Concern   Not on file  Social History Narrative   Not on file   Social Determinants of Health   Financial Resource Strain: Patient Declined (05/18/2023)   Overall Financial Resource Strain (CARDIA)    Difficulty of Paying Living Expenses: Patient declined  Food Insecurity: No Food Insecurity (05/18/2023)   Hunger Vital Sign    Worried About Running Out of Food in the Last Year: Never true    Ran Out of Food in the Last Year: Never true  Transportation Needs: No Transportation Needs (05/18/2023)   PRAPARE - Administrator, Civil Service (Medical): No    Lack of Transportation (Non-Medical): No  Physical Activity: Unknown (05/18/2023)   Exercise Vital Sign    Days of Exercise per Week: Patient declined    Minutes of Exercise per Session: 20 min  Recent Concern: Physical Activity - Insufficiently Active (03/23/2023)   Exercise Vital Sign    Days of Exercise per Week: 5 days    Minutes of Exercise per Session: 20 min  Stress: No Stress Concern Present (05/18/2023)   [employer] of Occupational Health - Occupational Stress Questionnaire    Feeling of Stress : Not at all  Social Connections: Unknown (05/18/2023)   Social Connection and Isolation Panel [NHANES]    Frequency of Communication with Friends and Family: More than three times a week    Frequency of Social Gatherings with Friends  and Family: Three times a week    Attends Religious Services: Patient declined    Active Member of Clubs or Organizations: No    Attends Banker Meetings: Never    Marital Status: Married  Recent Concern: Social Connections - Moderately Isolated (03/23/2023)   Social Connection and Isolation Panel [NHANES]    Frequency of Communication with Friends and Family: More than three times a week    Frequency of Social Gatherings with Friends and Family: More than three times a week    Attends Religious Services: Never    Database administrator or Organizations: No    Attends Banker Meetings: Never    Marital Status: Married  Catering manager Violence: Not At Risk (03/23/2023)   Humiliation, Afraid, Rape, and Kick questionnaire    Fear of Current or Ex-Partner: No    Emotionally Abused: No    Physically Abused: No    Sexually Abused: No    Review of Systems:  All other review of systems negative except as mentioned in the HPI.  Physical Exam: Vital signs in last 24 hours: BP (!) 167/79   Pulse (!) 53   Temp 98.4 F (36.9 C)   Resp 13   Ht 5\' 2"  (1.575 m)   Wt 175 lb (79.4 kg)   SpO2 98%   BMI 32.01 kg/m  General:   Alert, NAD Lungs:  Clear .   Heart:  Regular rate and rhythm Abdomen:  Soft, nontender and nondistended. Neuro/Psych:  Alert and cooperative. Normal mood and affect. A and O x 3  Reviewed labs, radiology imaging, old records and pertinent past GI work up  Patient is appropriate for planned procedure(s) and anesthesia in an ambulatory setting   K. Scherry Ran , MD [phone]

## 2023-09-01 NOTE — Patient Instructions (Signed)
YOU HAD AN ENDOSCOPIC PROCEDURE TODAY AT THE [facility]:   Refer to the procedure report that was given to you for any specific questions about what was found during the examination.  If the procedure report does not answer your questions, please call your gastroenterologist to clarify.  If you requested that your care partner not be given the details of your procedure findings, then the procedure report has been included in a sealed envelope.  **Handout given on Gastritis**  YOU SHOULD EXPECT: Some feelings of bloating in the abdomen. Passage of more gas than usual.  Walking can help get rid of the air that was put into your GI tract during the procedure and reduce the bloating. If you had a lower endoscopy (such as a colonoscopy or flexible sigmoidoscopy) you may notice spotting of blood in your stool or on the toilet paper. If you underwent a bowel prep for your procedure, you may not have a normal bowel movement for a few days.  Please Note:  You might notice some irritation and congestion in your nose or some drainage.  This is from the oxygen used during your procedure.  There is no need for concern and it should clear up in a day or so.  SYMPTOMS TO REPORT IMMEDIATELY:  Following upper endoscopy (EGD)  Vomiting of blood or coffee ground material  New chest pain or pain under the shoulder blades  Painful or persistently difficult swallowing  New shortness of breath  Fever of 100F or higher  Black, tarry-looking stools  For urgent or emergent issues, a gastroenterologist can be reached at any hour by calling [phone]. Do not use MyChart messaging for urgent concerns.    DIET:  We do recommend a small meal at first, but then you may proceed to your regular diet.  Drink plenty of fluids but you should avoid alcoholic beverages for 24 hours.  ACTIVITY:  You should plan to take it easy for the rest of today and you should NOT DRIVE or use heavy machinery until tomorrow  (because of the sedation medicines used during the test).    FOLLOW UP: Our staff will call the number listed on your records the next business day following your procedure.  We will call around 7:15- 8:00 am to check on you and address any questions or concerns that you may have regarding the information given to you following your procedure. If we do not reach you, we will leave a message.     If any biopsies were taken you will be contacted by phone or by letter within the next 1-3 weeks.  Please call us at [phone] if you have not heard about the biopsies in 3 weeks.    SIGNATURES/CONFIDENTIALITY: You and/or your care partner have signed paperwork which will be entered into your electronic medical record.  These signatures attest to the fact that that the information above on your After Visit Summary has been reviewed and is understood.  Full responsibility of the confidentiality of this discharge information lies with you and/or your care-partner.

## 2023-09-01 NOTE — Progress Notes (Signed)
Uneventful anesthetic. Report to pacu rn. Vss. Care resumed by rn. 

## 2023-09-02 ENCOUNTER — Telehealth: Payer: Self-pay

## 2023-09-02 NOTE — Telephone Encounter (Signed)
  Follow up Call-     09/01/2023    8:14 AM  Call back number  Post procedure Call Back phone  # [phone] (daughter)  Permission to leave phone message Yes     Patient questions:  Do you have a fever, pain , or abdominal swelling? No. Pain Score  0 *  Have you tolerated food without any problems? Yes.    Have you been able to return to your normal activities? Yes.    Do you have any questions about your discharge instructions: Diet   No. Medications  No. Follow up visit  No.  Do you have questions or concerns about your Care? No.  Actions: * If pain score is 4 or above: No action needed, pain <4.

## 2023-09-21 ENCOUNTER — Encounter: Payer: Self-pay | Admitting: Gastroenterology

## 2023-10-28 ENCOUNTER — Other Ambulatory Visit (HOSPITAL_BASED_OUTPATIENT_CLINIC_OR_DEPARTMENT_OTHER): Payer: Self-pay

## 2023-10-28 ENCOUNTER — Other Ambulatory Visit (HOSPITAL_BASED_OUTPATIENT_CLINIC_OR_DEPARTMENT_OTHER): Payer: Self-pay | Admitting: Family Medicine

## 2023-10-28 ENCOUNTER — Other Ambulatory Visit: Payer: Self-pay

## 2023-11-08 ENCOUNTER — Encounter (HOSPITAL_BASED_OUTPATIENT_CLINIC_OR_DEPARTMENT_OTHER): Payer: Self-pay

## 2023-11-08 ENCOUNTER — Other Ambulatory Visit (HOSPITAL_BASED_OUTPATIENT_CLINIC_OR_DEPARTMENT_OTHER): Payer: Self-pay

## 2023-11-18 ENCOUNTER — Encounter (HOSPITAL_BASED_OUTPATIENT_CLINIC_OR_DEPARTMENT_OTHER): Payer: Self-pay | Admitting: Family Medicine

## 2024-01-02 ENCOUNTER — Other Ambulatory Visit: Payer: Self-pay

## 2024-01-02 ENCOUNTER — Emergency Department (HOSPITAL_BASED_OUTPATIENT_CLINIC_OR_DEPARTMENT_OTHER)
Admission: EM | Admit: 2024-01-02 | Discharge: 2024-01-02 | Disposition: A | Discharge status: Home / Self Care | Payer: Medicare Other | Attending: Emergency Medicine | Admitting: Emergency Medicine

## 2024-01-02 ENCOUNTER — Emergency Department (HOSPITAL_BASED_OUTPATIENT_CLINIC_OR_DEPARTMENT_OTHER): Payer: Medicare Other

## 2024-01-02 ENCOUNTER — Encounter (HOSPITAL_BASED_OUTPATIENT_CLINIC_OR_DEPARTMENT_OTHER): Payer: Self-pay

## 2024-01-02 DIAGNOSIS — R1032 Left lower quadrant pain: Secondary | ICD-10-CM

## 2024-01-02 DIAGNOSIS — I10 Essential (primary) hypertension: Secondary | ICD-10-CM | POA: Diagnosis not present

## 2024-01-02 DIAGNOSIS — E119 Type 2 diabetes mellitus without complications: Secondary | ICD-10-CM | POA: Insufficient documentation

## 2024-01-02 DIAGNOSIS — K5900 Constipation, unspecified: Secondary | ICD-10-CM | POA: Diagnosis not present

## 2024-01-02 DIAGNOSIS — Z79899 Other long term (current) drug therapy: Secondary | ICD-10-CM | POA: Insufficient documentation

## 2024-01-02 LAB — CBC
HCT: 45.5 % (ref 39.0–52.0)
Hemoglobin: 15.9 g/dL (ref 13.0–17.0)
MCH: 30 pg (ref 26.0–34.0)
MCHC: 34.9 g/dL (ref 30.0–36.0)
MCV: 85.8 fL (ref 80.0–100.0)
Platelets: 189 10*3/uL (ref 150–400)
RBC: 5.3 MIL/uL (ref 4.22–5.81)
RDW: 12.1 % (ref 11.5–15.5)
WBC: 5.1 10*3/uL (ref 4.0–10.5)
nRBC: 0 % (ref 0.0–0.2)

## 2024-01-02 LAB — URINALYSIS, ROUTINE W REFLEX MICROSCOPIC
Bilirubin Urine: NEGATIVE
Glucose, UA: NEGATIVE mg/dL
Hgb urine dipstick: NEGATIVE
Ketones, ur: NEGATIVE mg/dL
Leukocytes,Ua: NEGATIVE
Nitrite: NEGATIVE
Protein, ur: NEGATIVE mg/dL
Specific Gravity, Urine: 1.01 (ref 1.005–1.030)
pH: 7.5 (ref 5.0–8.0)

## 2024-01-02 LAB — COMPREHENSIVE METABOLIC PANEL
ALT: 43 U/L (ref 0–44)
AST: 36 U/L (ref 15–41)
Albumin: 4.8 g/dL (ref 3.5–5.0)
Alkaline Phosphatase: 83 U/L (ref 38–126)
Anion gap: 8 (ref 5–15)
BUN: 9 mg/dL (ref 8–23)
CO2: 27 mmol/L (ref 22–32)
Calcium: 9.7 mg/dL (ref 8.9–10.3)
Chloride: 99 mmol/L (ref 98–111)
Creatinine, Ser: 0.89 mg/dL (ref 0.61–1.24)
GFR, Estimated: >60 mL/min (ref >60)
Glucose, Bld: 103 mg/dL — ABNORMAL HIGH (ref 70–99)
Potassium: 4.2 mmol/L (ref 3.5–5.1)
Sodium: 134 mmol/L — ABNORMAL LOW (ref 135–145)
Total Bilirubin: 1.1 mg/dL (ref 0.0–1.2)
Total Protein: 8.3 g/dL — ABNORMAL HIGH (ref 6.5–8.1)

## 2024-01-02 LAB — LIPASE, BLOOD: Lipase: 10 U/L — ABNORMAL LOW (ref 11–51)

## 2024-01-02 MED ORDER — IOHEXOL 300 MG/ML  SOLN
100.0000 mL | Freq: Once | INTRAMUSCULAR | Status: AC | PRN
  Administered 2024-01-02: 100 mL via INTRAVENOUS

## 2024-01-02 MED ORDER — FENTANYL CITRATE PF 50 MCG/ML IJ SOSY
25.0000 ug | PREFILLED_SYRINGE | Freq: Once | INTRAMUSCULAR | Status: AC
  Administered 2024-01-02: 25 ug via INTRAVENOUS
  Filled 2024-01-02: qty 1

## 2024-01-02 MED ORDER — SENNOSIDES-DOCUSATE SODIUM 8.6-50 MG PO TABS: 1.0000 | ORAL_TABLET | Freq: Two times a day (BID) | ORAL | 0 refills | Status: AC

## 2024-01-02 NOTE — Discharge Instructions (Addendum)
 Also recommend increase in miralax (may take up to 4 caps in one 32oz bottle)--can start with 2 caps a day as needed while having discomfort and constipation.

## 2024-01-02 NOTE — ED Provider Notes (Signed)
 [place] [facility] AT [facility] Provider Note   CSN: [id number] Arrival date & time: 01/02/24  [id number]     History  Chief Complaint  Patient presents with   Abdominal Pain    Jorge [name] is a 73 y.o. male.  HPI     LLQ pain, cramping pain, began yesterday and got worse overnight No nausea or vomiting Constipation, history of, had bm yesterday, no concerns Does have some burning with urination which is coming and going over a long time No fever or chills   Son interpreting, speaks nepali   Past Medical History:  Diagnosis Date   Acid reflux    Arthritis    right leg   Cataract    bil removed   Diabetes (HCC)    Hypertension     Home Medications Prior to Admission medications   Medication Sig Start Date End Date Taking? Authorizing Provider  senna-docusate (SENOKOT-S) 8.6-50 MG tablet Take 1 tablet by mouth 2 (two) times daily for 7 days. 01/02/24 01/09/24 Yes Dreama Longs, MD  alum & mag hydroxide-simeth (MAALOX MAX) 400-400-40 MG/5ML suspension Take 15 mLs by mouth every 6 (six) hours as needed for indigestion. 04/08/23   Kingsley, Victoria K, DO  benzonatate  (TESSALON ) 100 MG capsule Take 1 capsule (100 mg total) by mouth every 8 (eight) hours. 02/05/23   Emil Share, DO  chlorhexidine  (PERIDEX ) 0.12 % solution SMARTSIG:By Mouth 08/02/23   [provider]  diclofenac  Sodium (VOLTAREN ) 1 % GEL Apply 2 g topically 4 (four) times daily to affected joint. 03/16/23   de [place], Quintin PARAS, MD  dicyclomine  (BENTYL ) 20 MG tablet Take 0.5 tablets (10 mg total) by mouth 2 (two) times daily for 7 days. 05/18/23 05/25/23  de [place], Raymond J, MD  famotidine  (PEPCID ) 20 MG tablet Take 1 tablet (20 mg total) by mouth at bedtime. 09/01/23   Nandigam, Kavitha V, MD  gabapentin  (NEURONTIN ) 300 MG capsule Take 1 capsule (300 mg total) by mouth 3 (three) times daily. 04/12/23   Booker Darice SAUNDERS, FNP  HYDROcodone -acetaminophen  (NORCO) 5-325 MG tablet Take 1 tablet by  mouth every 6 (six) hours as needed for moderate pain. 02/18/23   McKenzie, Belvie CROME, MD  lisinopril  (ZESTRIL ) 10 MG tablet Take 1 tablet (10 mg total) by mouth daily. 04/12/23   Booker Darice SAUNDERS, FNP  ondansetron  (ZOFRAN -ODT) 4 MG disintegrating tablet Dissolve 1 tablet under the tongue every 4 hours as needed for nausea/vomit 02/05/23   Emil Share, DO  pantoprazole  (PROTONIX ) 40 MG tablet Take 1 tablet (40 mg total) by mouth daily. 09/01/23   Nandigam, Kavitha V, MD  polyethylene glycol (MIRALAX ) 17 g packet Take 17 g by mouth daily. Patient not taking: Reported on 09/01/2023 05/18/23   de [place], Quintin PARAS, MD  psyllium (METAMUCIL SMOOTH TEXTURE) 58.6 % powder Take 1 packet by mouth 3 (three) times daily. 04/08/23   Kingsley, Victoria K, DO  tamsulosin  (FLOMAX ) 0.4 MG CAPS capsule Take 1 capsule (0.4 mg total) by mouth daily. 01/27/23   de [place], Raymond J, MD      Allergies    Patient has no known allergies.    Review of Systems   Review of Systems  Physical Exam Updated Vital Signs BP (!) 153/88 (BP Location: Right Arm)   Pulse (!) 54   Temp 98.5 F (36.9 C)   Resp 20   SpO2 94%  Physical Exam Vitals and nursing note reviewed.  Constitutional:      General:  He is not in acute distress.    Appearance: He is well-developed. He is not diaphoretic.  HENT:     Head: Normocephalic and atraumatic.  Eyes:     Conjunctiva/sclera: Conjunctivae normal.  Cardiovascular:     Rate and Rhythm: Normal rate and regular rhythm.     Heart sounds: Normal heart sounds. No murmur heard.    No friction rub. No gallop.  Pulmonary:     Effort: Pulmonary effort is normal. No respiratory distress.     Breath sounds: Normal breath sounds. No wheezing or rales.  Abdominal:     General: There is no distension.     Palpations: Abdomen is soft.     Tenderness: There is abdominal tenderness in the left upper quadrant and left lower quadrant. There is no guarding.  Musculoskeletal:     Cervical back: Normal range  of motion.  Skin:    General: Skin is warm and dry.  Neurological:     Mental Status: He is alert and oriented to person, place, and time.     ED Results / Procedures / Treatments   Labs (all labs ordered are listed, but only abnormal results are displayed) Labs Reviewed  COMPREHENSIVE METABOLIC PANEL - Abnormal; Notable for the following components:      Result Value   Sodium 134 (*)    Glucose, Bld 103 (*)    Total Protein 8.3 (*)    All other components within normal limits  LIPASE, BLOOD - Abnormal; Notable for the following components:   Lipase 10 (*)    All other components within normal limits  URINALYSIS, ROUTINE W REFLEX MICROSCOPIC  CBC    EKG None  Radiology CT ABDOMEN PELVIS W CONTRAST Result Date: 01/02/2024 CLINICAL DATA:  Left lower quadrant abdominal pain with dysuria. History of bladder tumor resection. EXAM: CT ABDOMEN AND PELVIS WITH CONTRAST TECHNIQUE: Multidetector CT imaging of the abdomen and pelvis was performed using the standard protocol following bolus administration of intravenous contrast. RADIATION DOSE REDUCTION: This exam was performed according to the departmental dose-optimization program which includes automated exposure control, adjustment of the mA and/or kV according to patient size and/or use of iterative reconstruction technique. CONTRAST:  OMNIPAQUE  IOHEXOL  300 MG/ML  SOLN COMPARISON:  Abdominopelvic CT 08/21/2023 and 07/24/2023. FINDINGS: Technical note: Despite efforts by the technologist and patient, mild motion artifact is present on today's exam and could not be eliminated. This reduces exam sensitivity and specificity. Lower chest: Clear lung bases. No significant pleural or pericardial effusion. Hepatobiliary: Stable small cyst in the left hepatic lobe and mild steatosis. No suspicious liver lesions. No evidence of gallstones, gallbladder wall thickening or biliary dilatation. Pancreas: Unremarkable. No pancreatic ductal dilatation or  surrounding inflammatory changes. Spleen: Normal in size without focal abnormality. Adrenals/Urinary Tract: Both adrenal glands appear normal. No evidence of urinary tract calculus, suspicious renal lesion or hydronephrosis. Stable small cyst in the lower pole of the right kidney for which no specific follow-up imaging recommended. The bladder appears unremarkable for its degree of distention. Stomach/Bowel: No enteric contrast administered. The stomach appears unremarkable for its degree of distension. No evidence of bowel wall thickening, distention or surrounding inflammatory change. The appendix appears normal. Prominent stool throughout the colon. Vascular/Lymphatic: There are no enlarged abdominal or pelvic lymph nodes. Aortic and branch vessel atherosclerosis without evidence of aneurysm or large vessel occlusion. Reproductive: Stable mild enlargement and heterogeneity of the prostate gland. Other: No evidence of abdominal wall mass or hernia. No ascites or  pneumoperitoneum. Musculoskeletal: No acute or significant osseous findings. Mild spondylosis with postsurgical changes in the lower lumbar spine. IMPRESSION: 1. No acute findings or explanation for the patient's symptoms. 2. Prominent stool throughout the colon suggesting constipation. 3. Stable mild enlargement and heterogeneity of the prostate gland. 4.  Aortic Atherosclerosis (ICD10-I70.0). Electronically Signed   By: Elsie Perone M.D.   On: 01/02/2024 09:34    Procedures Procedures    Medications Ordered in ED Medications  fentaNYL  (SUBLIMAZE ) injection 25 mcg (25 mcg Intravenous Given 01/02/24 0931)  iohexol  (OMNIPAQUE ) 300 MG/ML solution 100 mL (100 mLs Intravenous Contrast Given 01/02/24 0914)    ED Course/ Medical Decision Making/ A&P                                  73yo male with history of hypertension, DM presents with concern for left sided abdominal pain.  DDx includes appendicitis, pancreatitis, cholecystitis,  pyelonephritis, nephrolithiasis, diverticulitis, SBO, AAA.  Labs personally evaluated and interpreted by me show no sign of UTI, no leukocytosis or anemia, no clinically significant electrolyte abnormalities, no hepatitis nor pancreatitis.  CT abdomen pelvis shows no acute findings, stable enlargement of prostate (has Urologist and follow up), shows prominent stoool throughout the colon suggestive of constipation.  Suspect this is likely etiology of pain. Discussed constipation regimen. Recommend PCP follow up. Patient discharged in stable condition with understanding of reasons to return.        Final Clinical Impression(s) / ED Diagnoses Final diagnoses:  Left lower quadrant abdominal pain  Constipation, unspecified constipation type    Rx / DC Orders ED Discharge Orders          Ordered    senna-docusate (SENOKOT-S) 8.6-50 MG tablet  2 times daily        01/02/24 1001              Dreama Longs, MD 01/03/24 [phone]

## 2024-01-02 NOTE — ED Triage Notes (Signed)
 He c/o llq abd. Pain plus dysuria since yesterday. He tells me he has hx of "my whole belly hurting, but never just on the left side". He is in no distress and is ambulatory. His son is with him.

## 2024-02-21 ENCOUNTER — Other Ambulatory Visit: Payer: Medicaid Other | Admitting: Urology

## 2024-02-23 ENCOUNTER — Ambulatory Visit (INDEPENDENT_AMBULATORY_CARE_PROVIDER_SITE_OTHER): Payer: Medicare Other | Admitting: Urology

## 2024-02-23 VITALS — BP 136/77 | HR 62

## 2024-02-23 DIAGNOSIS — Z8551 Personal history of malignant neoplasm of bladder: Secondary | ICD-10-CM

## 2024-02-23 DIAGNOSIS — N329 Bladder disorder, unspecified: Secondary | ICD-10-CM

## 2024-02-23 DIAGNOSIS — N400 Enlarged prostate without lower urinary tract symptoms: Secondary | ICD-10-CM | POA: Diagnosis not present

## 2024-02-23 DIAGNOSIS — K4090 Unilateral inguinal hernia, without obstruction or gangrene, not specified as recurrent: Secondary | ICD-10-CM

## 2024-02-23 MED ORDER — CIPROFLOXACIN HCL 500 MG PO TABS
500.0000 mg | ORAL_TABLET | Freq: Once | ORAL | Status: AC
  Administered 2024-02-23: 500 mg via ORAL

## 2024-02-23 NOTE — Progress Notes (Signed)
   02/23/24  CC: bladder cancer  HPI: Jorge [name] is a 73yo here for followup for bladder cancer Blood pressure 136/77, pulse 62. NED. A&Ox3.   No respiratory distress   Abd soft, NT, ND Normal phallus with bilateral descended testicles  Cystoscopy Procedure Note  Patient identification was confirmed, informed consent was obtained, and patient was prepped using Betadine solution.  Lidocaine jelly was administered per urethral meatus.     Pre-Procedure: - Inspection reveals a normal caliber ureteral meatus.  Procedure: The flexible cystoscope was introduced without difficulty - No urethral strictures/lesions are present. - Enlarged prostate  - normal bladder neck - Bilateral ureteral orifices identified - Bladder mucosa  reveals no ulcers, tumors, or lesions - No bladder stones - No trabeculation    Post-Procedure: - Patient tolerated the procedure well  Assessment/ Plan: Followup 1 year cystoscopy. Referal to general surgery for right inguinal hernia  No follow-ups on file.  Wilkie Aye, MD

## 2024-02-24 LAB — URINALYSIS, ROUTINE W REFLEX MICROSCOPIC
Bilirubin, UA: NEGATIVE
Glucose, UA: NEGATIVE
Ketones, UA: NEGATIVE
Leukocytes,UA: NEGATIVE
Nitrite, UA: NEGATIVE
Protein,UA: NEGATIVE
RBC, UA: NEGATIVE
Specific Gravity, UA: 1.015 (ref 1.005–1.030)
Urobilinogen, Ur: 0.2 mg/dL (ref 0.2–1.0)
pH, UA: 7 (ref 5.0–7.5)

## 2024-02-29 ENCOUNTER — Encounter: Payer: Self-pay | Admitting: Urology

## 2024-02-29 NOTE — Patient Instructions (Signed)

## 2024-03-28 ENCOUNTER — Ambulatory Visit: Payer: Medicare Other | Admitting: General Surgery

## 2024-03-28 ENCOUNTER — Encounter (HOSPITAL_BASED_OUTPATIENT_CLINIC_OR_DEPARTMENT_OTHER): Payer: Medicaid Other

## 2024-04-04 ENCOUNTER — Encounter: Payer: Self-pay | Admitting: General Surgery

## 2024-04-04 ENCOUNTER — Ambulatory Visit (INDEPENDENT_AMBULATORY_CARE_PROVIDER_SITE_OTHER): Admitting: General Surgery

## 2024-04-04 VITALS — BP 129/73 | HR 60 | Temp 97.5°F | Resp 14 | Ht 62.0 in | Wt 177.0 lb

## 2024-04-04 DIAGNOSIS — K4090 Unilateral inguinal hernia, without obstruction or gangrene, not specified as recurrent: Secondary | ICD-10-CM

## 2024-04-05 NOTE — Progress Notes (Signed)
 Jorge [name]; [id number]; [birth year]   HPI Patient is a 73yo male who was referred to my care by Dr. Ronne Binning of Urology for evaluation and treatment of right groin pain.  Found to have a right inguina hernia.  History obtained from daughter who is with patient.  Patient states he has intermittent right groin discomfort when sitting a long time.  No right groin lump noted.  States he has varied pain above this region. Past Medical History:  Diagnosis Date   Acid reflux    Arthritis    right leg   Cataract    bil removed   Diabetes (HCC)    Hypertension     Past Surgical History:  Procedure Laterality Date   cataracts Bilateral    CYSTOSCOPY N/A 02/18/2023   Procedure: CYSTOSCOPY;  Surgeon: Malen Gauze, MD;  Location: [facility];  Service: Urology;  Laterality: N/A;   LUMBAR LAMINECTOMY/DECOMPRESSION MICRODISCECTOMY Right 12/25/2016   Procedure: Lumbar three-five Laminectomy, Right Lumbar three-four, Lumbar four-five Diskectomy ;  Surgeon: Loura Halt Ditty, MD;  Location: [facility] OR;  Service: Neurosurgery;  Laterality: Right;   TRANSURETHRAL RESECTION OF BLADDER TUMOR N/A 02/18/2023   Procedure: TRANSURETHRAL RESECTION OF BLADDER TUMOR (TURBT)- prostatic urethra biopsy;  Surgeon: Malen Gauze, MD;  Location: [facility];  Service: Urology;  Laterality: N/A;    Family History  Problem Relation Age of Onset   Colon cancer Neg Hx    Esophageal cancer Neg Hx    Pancreatic cancer Neg Hx    Prostate cancer Neg Hx    Rectal cancer Neg Hx    Stomach cancer Neg Hx     Current Outpatient Medications on File Prior to Visit  Medication Sig Dispense Refill   alum & mag hydroxide-simeth (MAALOX MAX) 400-400-40 MG/5ML suspension Take 15 mLs by mouth every 6 (six) hours as needed for indigestion. 355 mL 0   chlorhexidine (PERIDEX) 0.12 % solution SMARTSIG:By Mouth     diclofenac Sodium (VOLTAREN) 1 % GEL Apply 2 g topically 4 (four) times daily to affected joint. 100 g 1   famotidine  (PEPCID) 20 MG tablet Take 1 tablet (20 mg total) by mouth at bedtime. 90 tablet 3   gabapentin (NEURONTIN) 300 MG capsule Take 1 capsule (300 mg total) by mouth 3 (three) times daily. 90 capsule 1   HYDROcodone-acetaminophen (NORCO) 5-325 MG tablet Take 1 tablet by mouth every 6 (six) hours as needed for moderate pain. 30 tablet 0   lisinopril (ZESTRIL) 10 MG tablet Take 1 tablet (10 mg total) by mouth daily. 90 tablet 1   ondansetron (ZOFRAN-ODT) 4 MG disintegrating tablet Dissolve 1 tablet under the tongue every 4 hours as needed for nausea/vomit 20 tablet 0   pantoprazole (PROTONIX) 40 MG tablet Take 1 tablet (40 mg total) by mouth daily. 90 tablet 3   polyethylene glycol (MIRALAX) 17 g packet Take 17 g by mouth daily. 14 each 1   psyllium (METAMUCIL SMOOTH TEXTURE) 58.6 % powder Take 1 packet by mouth 3 (three) times daily. 283 g 12   tamsulosin (FLOMAX) 0.4 MG CAPS capsule Take 1 capsule (0.4 mg total) by mouth daily. 30 capsule 2   dicyclomine (BENTYL) 20 MG tablet Take 0.5 tablets (10 mg total) by mouth 2 (two) times daily for 7 days. 7 tablet 0   No current facility-administered medications on file prior to visit.    No Known Allergies  Social History   Substance and Sexual Activity  Alcohol Use No  Social History   Tobacco Use  Smoking Status Former   Current packs/day: 0.00   Average packs/day: 0.3 packs/day for 40.0 years (10.0 ttl pk-yrs)   Types: Cigarettes   Start date: 12/17/1971   Quit date: 12/17/2011   Years since quitting: 12.3  Smokeless Tobacco Never    Review of Systems  Constitutional: Negative.   HENT:  Positive for ear pain.   Eyes: Negative.   Respiratory: Negative.    Cardiovascular: Negative.   Gastrointestinal:  Positive for abdominal pain and heartburn.  Genitourinary:  Positive for dysuria, frequency and urgency.  Musculoskeletal:  Positive for back pain, joint pain and neck pain.  Skin: Negative.   Neurological: Negative.    Endo/Heme/Allergies: Negative.   Psychiatric/Behavioral: Negative.      Objective   Vitals:   04/04/24 1536  BP: 129/73  Pulse: 60  Resp: 14  Temp: (!) 97.5 F (36.4 C)  SpO2: 92%    Physical Exam Vitals reviewed.  Constitutional:      Appearance: Normal appearance. He is not ill-appearing.  HENT:     Head: Normocephalic and atraumatic.  Cardiovascular:     Rate and Rhythm: Normal rate and regular rhythm.     Heart sounds: Normal heart sounds. No murmur heard.    No friction rub. No gallop.  Pulmonary:     Effort: Pulmonary effort is normal. No respiratory distress.     Breath sounds: Normal breath sounds. No stridor. No wheezing, rhonchi or rales.  Abdominal:     General: Bowel sounds are normal. There is no distension.     Palpations: Abdomen is soft. There is no mass.     Tenderness: There is no abdominal tenderness. There is no guarding or rebound.     Hernia: A hernia is present.     Comments: Small weak right inguinal floor, reducible.  No left inguinal hernia.  Genitourinary:    Testes: Normal.  Skin:    General: Skin is warm and dry.  Neurological:     Mental Status: He is alert and oriented to person, place, and time.   Urology note reviewed.  Assessment  Right inguinal hernia, not significantly symptomatic. Plan  Will monitor this with patient as he only wants surgery if absolutely necessary.  Literature given.  Instructed to return if he becomes more symptomatic.

## 2024-04-08 ENCOUNTER — Emergency Department (HOSPITAL_BASED_OUTPATIENT_CLINIC_OR_DEPARTMENT_OTHER)

## 2024-04-08 ENCOUNTER — Encounter (HOSPITAL_BASED_OUTPATIENT_CLINIC_OR_DEPARTMENT_OTHER): Payer: Self-pay | Admitting: Emergency Medicine

## 2024-04-08 ENCOUNTER — Other Ambulatory Visit: Payer: Self-pay

## 2024-04-08 ENCOUNTER — Emergency Department (HOSPITAL_BASED_OUTPATIENT_CLINIC_OR_DEPARTMENT_OTHER)
Admission: EM | Admit: 2024-04-08 | Discharge: 2024-04-08 | Disposition: A | Discharge status: Home / Self Care | Attending: Emergency Medicine | Admitting: Emergency Medicine

## 2024-04-08 DIAGNOSIS — R110 Nausea: Secondary | ICD-10-CM | POA: Insufficient documentation

## 2024-04-08 DIAGNOSIS — Z79899 Other long term (current) drug therapy: Secondary | ICD-10-CM | POA: Diagnosis not present

## 2024-04-08 DIAGNOSIS — R1084 Generalized abdominal pain: Secondary | ICD-10-CM | POA: Diagnosis not present

## 2024-04-08 DIAGNOSIS — I10 Essential (primary) hypertension: Secondary | ICD-10-CM | POA: Insufficient documentation

## 2024-04-08 DIAGNOSIS — R197 Diarrhea, unspecified: Secondary | ICD-10-CM | POA: Insufficient documentation

## 2024-04-08 LAB — COMPREHENSIVE METABOLIC PANEL WITH GFR
ALT: 34 U/L (ref 0–44)
AST: 28 U/L (ref 15–41)
Albumin: 4.3 g/dL (ref 3.5–5.0)
Alkaline Phosphatase: 79 U/L (ref 38–126)
Anion gap: 8 (ref 5–15)
BUN: 6 mg/dL — ABNORMAL LOW (ref 8–23)
CO2: 21 mmol/L — ABNORMAL LOW (ref 22–32)
Calcium: 9.5 mg/dL (ref 8.9–10.3)
Chloride: 109 mmol/L (ref 98–111)
Creatinine, Ser: 0.88 mg/dL (ref 0.61–1.24)
GFR, Estimated: >60 mL/min (ref >60)
Glucose, Bld: 168 mg/dL — ABNORMAL HIGH (ref 70–99)
Potassium: 4.1 mmol/L (ref 3.5–5.1)
Sodium: 138 mmol/L (ref 135–145)
Total Bilirubin: 0.5 mg/dL (ref 0.0–1.2)
Total Protein: 7.3 g/dL (ref 6.5–8.1)

## 2024-04-08 LAB — CBC WITH DIFFERENTIAL/PLATELET
Abs Immature Granulocytes: 0.01 10*3/uL (ref 0.00–0.07)
Basophils Absolute: 0 10*3/uL (ref 0.0–0.1)
Basophils Relative: 1 %
Eosinophils Absolute: 0.2 10*3/uL (ref 0.0–0.5)
Eosinophils Relative: 5 %
HCT: 40 % (ref 39.0–52.0)
Hemoglobin: 14.2 g/dL (ref 13.0–17.0)
Immature Granulocytes: 0 %
Lymphocytes Relative: 49 %
Lymphs Abs: 2 10*3/uL (ref 0.7–4.0)
MCH: 31 pg (ref 26.0–34.0)
MCHC: 35.5 g/dL (ref 30.0–36.0)
MCV: 87.3 fL (ref 80.0–100.0)
Monocytes Absolute: 0.3 10*3/uL (ref 0.1–1.0)
Monocytes Relative: 8 %
Neutro Abs: 1.5 10*3/uL — ABNORMAL LOW (ref 1.7–7.7)
Neutrophils Relative %: 37 %
Platelets: 142 10*3/uL — ABNORMAL LOW (ref 150–400)
RBC: 4.58 MIL/uL (ref 4.22–5.81)
RDW: 12.7 % (ref 11.5–15.5)
WBC: 4.1 10*3/uL (ref 4.0–10.5)
nRBC: 0 % (ref 0.0–0.2)

## 2024-04-08 LAB — URINALYSIS, ROUTINE W REFLEX MICROSCOPIC
Bilirubin Urine: NEGATIVE
Glucose, UA: NEGATIVE mg/dL
Hgb urine dipstick: NEGATIVE
Ketones, ur: NEGATIVE mg/dL
Leukocytes,Ua: NEGATIVE
Nitrite: NEGATIVE
Protein, ur: NEGATIVE mg/dL
Specific Gravity, Urine: 1.028 (ref 1.005–1.030)
pH: 6 (ref 5.0–8.0)

## 2024-04-08 LAB — TROPONIN I (HIGH SENSITIVITY): Troponin I (High Sensitivity): 6 ng/L (ref <18)

## 2024-04-08 LAB — LACTIC ACID, PLASMA
Lactic Acid, Venous: 1.6 mmol/L (ref 0.5–1.9)
Lactic Acid, Venous: 1.2 mmol/L (ref 0.5–1.9)

## 2024-04-08 LAB — LIPASE, BLOOD: Lipase: 18 U/L (ref 11–51)

## 2024-04-08 MED ORDER — ONDANSETRON HCL 4 MG/2ML IJ SOLN
4.0000 mg | Freq: Once | INTRAMUSCULAR | Status: AC
  Administered 2024-04-08: 4 mg via INTRAVENOUS
  Filled 2024-04-08: qty 2

## 2024-04-08 MED ORDER — ONDANSETRON 8 MG PO TBDP: 8.0000 mg | ORAL_TABLET | Freq: Three times a day (TID) | ORAL | 0 refills | Status: AC | PRN

## 2024-04-08 MED ORDER — IOHEXOL 300 MG/ML  SOLN
100.0000 mL | Freq: Once | INTRAMUSCULAR | Status: AC | PRN
  Administered 2024-04-08: 100 mL via INTRAVENOUS

## 2024-04-08 MED ORDER — FENTANYL CITRATE PF 50 MCG/ML IJ SOSY
25.0000 ug | PREFILLED_SYRINGE | Freq: Once | INTRAMUSCULAR | Status: AC
  Administered 2024-04-08: 25 ug via INTRAVENOUS
  Filled 2024-04-08: qty 1

## 2024-04-08 MED ORDER — LACTATED RINGERS IV BOLUS
1000.0000 mL | Freq: Once | INTRAVENOUS | Status: AC
  Administered 2024-04-08: 1000 mL via INTRAVENOUS

## 2024-04-08 NOTE — ED Notes (Signed)
 Pt given discharge instructions and reviewed prescriptions. Opportunities given for questions. Pt verbalizes understanding. PIV removed x1. Jillyn Hidden, RN

## 2024-04-08 NOTE — ED Triage Notes (Signed)
 Patient presents with diffuse abd pain and diarrhea since 0230. Recently dx with hernia

## 2024-04-08 NOTE — ED Provider Notes (Signed)
 [place] EMERGENCY DEPARTMENT AT [facility] Provider Note   CSN: [id number] Arrival date & time: 04/08/24  7829     History  Chief Complaint  Patient presents with   Diarrhea   Abdominal Pain    Jorge [name] is a 73 y.o. male.  Level 5 caveat for language barrier.  Translator is used.  Interpreter used ID number [id number]. patient with a history of hypertension and ulcers here with abdominal pain and diarrhea.  Reports diarrhea since approximately 2:30 AM approximately 8 or 9 episodes.  Diarrhea is nonbloody.  Has gone about every 30 minutes for the past 4 to 5 hours.  Has diffuse crampy abdominal pain and nausea but no vomiting.  No fever.  No chest pain unless he coughs.  No pain with urination or blood in the urine.  No travel or sick contacts.  No recent antibiotic use.  No one else has been sick with diarrhea.  He thinks he already has appendix taken out but is not sure.  Was recently diagnosed with an inguinal hernia that is being followed by surgery.  He denies any black or bloody stools.  Denies any dizziness or lightheadedness.  He is never had this problem before  Patient reports he was well when he went to bed last night and woke up with abdominal cramping and diarrhea which he describes as "watery".  No black or bloody stools.  Does not take any blood thinners.  He has a history of chronic stomach issues and upper abdominal pain but this lower abdominal pain is different.  Has never had this diarrhea before.  Colonoscopy in 2018 showed polyps and internal hemorrhoids.  Multiple CT scans have not shown any diverticula. Wife is recovering from shoulder surgery but she is not having any diarrhea.  No recent antibiotic use, travel or sick contacts.  The history is provided by the patient.  Diarrhea Associated symptoms: abdominal pain   Associated symptoms: no arthralgias, no fever, no headaches, no myalgias and no vomiting   Abdominal Pain Associated symptoms: diarrhea  and nausea   Associated symptoms: no chest pain, no cough, no dysuria, no fever, no hematuria, no shortness of breath and no vomiting        Home Medications Prior to Admission medications   Medication Sig Start Date End Date Taking? Authorizing Provider  alum & mag hydroxide-simeth (MAALOX MAX) 400-400-40 MG/5ML suspension Take 15 mLs by mouth every 6 (six) hours as needed for indigestion. 04/08/23   Kingsley, Victoria K, DO  chlorhexidine (PERIDEX) 0.12 % solution SMARTSIG:By Mouth 08/02/23   [provider]  diclofenac Sodium (VOLTAREN) 1 % GEL Apply 2 g topically 4 (four) times daily to affected joint. 03/16/23   [place], Alonza Jansky, MD  dicyclomine (BENTYL) 20 MG tablet Take 0.5 tablets (10 mg total) by mouth 2 (two) times daily for 7 days. 05/18/23 05/25/23  [place], Raymond J, MD  famotidine (PEPCID) 20 MG tablet Take 1 tablet (20 mg total) by mouth at bedtime. 09/01/23   Nandigam, Kavitha V, MD  gabapentin (NEURONTIN) 300 MG capsule Take 1 capsule (300 mg total) by mouth 3 (three) times daily. 04/12/23   Mickiel Albany, FNP  HYDROcodone-acetaminophen (NORCO) 5-325 MG tablet Take 1 tablet by mouth every 6 (six) hours as needed for moderate pain. 02/18/23   McKenzie, Arden Beck, MD  lisinopril (ZESTRIL) 10 MG tablet Take 1 tablet (10 mg total) by mouth daily. 04/12/23   Mickiel Albany, FNP  ondansetron (  ZOFRAN-ODT) 4 MG disintegrating tablet Dissolve 1 tablet under the tongue every 4 hours as needed for nausea/vomit 02/05/23   Albertus Hughs, DO  pantoprazole (PROTONIX) 40 MG tablet Take 1 tablet (40 mg total) by mouth daily. 09/01/23   Nandigam, Kavitha V, MD  polyethylene glycol (MIRALAX) 17 g packet Take 17 g by mouth daily. 05/18/23   [place], Alonza Jansky, MD  psyllium (METAMUCIL SMOOTH TEXTURE) 58.6 % powder Take 1 packet by mouth 3 (three) times daily. 04/08/23   Kingsley, Victoria K, DO  tamsulosin (FLOMAX) 0.4 MG CAPS capsule Take 1 capsule (0.4 mg total) by mouth daily. 01/27/23   [place], Raymond  J, MD      Allergies    Patient has no known allergies.    Review of Systems   Review of Systems  Constitutional:  Negative for activity change, appetite change and fever.  HENT:  Negative for congestion and rhinorrhea.   Respiratory:  Negative for cough, chest tightness and shortness of breath.   Cardiovascular:  Negative for chest pain.  Gastrointestinal:  Positive for abdominal pain, diarrhea and nausea. Negative for vomiting.  Genitourinary:  Negative for dysuria and hematuria.  Musculoskeletal:  Negative for arthralgias and myalgias.  Skin:  Negative for rash.  Neurological:  Negative for dizziness, weakness and headaches.   all other systems are negative except as noted in the HPI and PMH.    Physical Exam Updated Vital Signs BP (!) 179/76 (BP Location: Right Arm)   Pulse (!) 58   Temp 98.2 F (36.8 C) (Oral)   Resp 20   Ht 5\' 1"  (1.549 m)   Wt 82 kg   SpO2 99%   BMI 34.16 kg/m  Physical Exam Vitals and nursing note reviewed.  Constitutional:      General: He is not in acute distress.    Appearance: He is well-developed.  HENT:     Head: Normocephalic and atraumatic.     Mouth/Throat:     Pharynx: No oropharyngeal exudate.  Eyes:     Conjunctiva/sclera: Conjunctivae normal.     Pupils: Pupils are equal, round, and reactive to light.  Neck:     Comments: No meningismus. Cardiovascular:     Rate and Rhythm: Normal rate and regular rhythm.     Heart sounds: Normal heart sounds. No murmur heard. Pulmonary:     Effort: Pulmonary effort is normal. No respiratory distress.     Breath sounds: Normal breath sounds.  Abdominal:     Palpations: Abdomen is soft.     Tenderness: There is abdominal tenderness. There is no guarding or rebound.     Comments: Soft, tender left lower quadrant, no guarding or rebound.  Genitourinary:    Comments: No testicular tenderness, no obvious inguinal hernia Musculoskeletal:        General: No tenderness. Normal range of motion.      Cervical back: Normal range of motion and neck supple.  Skin:    General: Skin is warm.  Neurological:     Mental Status: He is alert and oriented to person, place, and time.     Cranial Nerves: No cranial nerve deficit.     Motor: No abnormal muscle tone.     Coordination: Coordination normal.     Comments:  5/5 strength throughout. CN 2-12 intact.Equal grip strength.   Psychiatric:        Behavior: Behavior normal.     ED Results / Procedures / Treatments   Labs (all labs ordered  are listed, but only abnormal results are displayed) Labs Reviewed  CBC WITH DIFFERENTIAL/PLATELET - Abnormal; Notable for the following components:      Result Value   Platelets 142 (*)    Neutro Abs 1.5 (*)    All other components within normal limits  COMPREHENSIVE METABOLIC PANEL WITH GFR - Abnormal; Notable for the following components:   CO2 21 (*)    Glucose, Bld 168 (*)    BUN 6 (*)    All other components within normal limits  C DIFFICILE QUICK SCREEN W PCR REFLEX    GASTROINTESTINAL PANEL BY PCR, STOOL (REPLACES STOOL CULTURE)  LIPASE, BLOOD  URINALYSIS, ROUTINE W REFLEX MICROSCOPIC  LACTIC ACID, PLASMA  LACTIC ACID, PLASMA  TROPONIN I (HIGH SENSITIVITY)    EKG EKG Interpretation Date/Time:  Saturday April 08 2024 07:01:25 EDT Ventricular Rate:  59 PR Interval:  156 QRS Duration:  84 QT Interval:  380 QTC Calculation: 377 R Axis:   40  Text Interpretation: Sinus rhythm Abnormal R-wave progression, early transition ST elevation, consider inferior injury Baseline wander in lead(s) V3 No significant change was found Confirmed by Earma Gloss [phone]) on 04/08/2024 7:02:41 AM  Radiology No results found.  Procedures Procedures    Medications Ordered in ED Medications  lactated ringers bolus 1,000 mL (has no administration in time range)  ondansetron (ZOFRAN) injection 4 mg (has no administration in time range)    ED Course/ Medical Decision Making/ A&P                                  Medical Decision Making Amount and/or Complexity of Data Reviewed Independent Historian: caregiver Labs: ordered. Decision-making details documented in ED Course. Radiology: ordered and independent interpretation performed. Decision-making details documented in ED Course. ECG/medicine tests: ordered and independent interpretation performed. Decision-making details documented in ED Course.  Risk Prescription drug management.   Left-sided lower abdominal pain with diarrhea.  Ongoing diarrhea for the past 4 hours.  Vitals are stable.  No distress.  Abdomen is soft but tender to the left lower quadrant.  No peritoneal signs.  Will hydrate and treat symptoms.  Will obtain blood work and stool studies of possible  Will attempt this obtain stool studies.  Will obtain CT imaging to evaluate for diverticulitis versus other acute pathology.  Patient appears well and nontoxic with no peritoneal signs.  Labs and imaging are pending at shift change.  Dr. Synetta Eves to assume care.        Final Clinical Impression(s) / ED Diagnoses Final diagnoses:  None    Rx / DC Orders ED Discharge Orders     None         Leaf Kernodle, Mara [place], MD 04/08/24 [phone]

## 2024-04-08 NOTE — ED Notes (Signed)
 Patient went to the bathroom to urinate once

## 2024-04-08 NOTE — ED Notes (Signed)
 ED Provider at bedside.

## 2024-04-08 NOTE — ED Provider Notes (Signed)
  Physical Exam  BP (!) 179/76 (BP Location: Right Arm)   Pulse (!) 58   Temp 98.2 F (36.8 C) (Oral)   Resp 20   Ht 1.549 m (5\' 1" )   Wt 82 kg   SpO2 99%   BMI 34.16 kg/m   Physical Exam  Procedures  Procedures  ED Course / MDM   Clinical Course as of 04/08/24 1006  Sat Apr 08, 2024  1000 CT reviewed interpreted no evidence of acute abnormality radiologist finds no acute findings [DR]  1000 CBC reviewed interpreted within normal limits Complete metabolic panel reviewed interpreted significant for mildly decreased CO2 of 21 glucose mildly elevated at 168 without anion gap doubt DKA  [DR]    Clinical Course User Index [DR] Auston Blush, MD   Medical Decision Making Amount and/or Complexity of Data Reviewed Labs: ordered. Radiology: ordered. ECG/medicine tests: ordered.  Risk Prescription drug management.   73 yo male awoke with left side abdominal pain and diarrhea nb stool 8-10.  NO sick contacts, no travel.  FB Dr. Larrie Po for hernia. Left sided abdominal ttp VSS HO hypertension , gerd, Here pending: Labs CT Dr. Alison Irvine with concern for diverticulitis. Multiple prior evaluations for upper abdominal pain and tx'd for GERD IV fluids and zofran, and fentanyl ordered per Dr. Alison Irvine Patient improved.  Discussed care with patient and son at bedside. Patient was unable to provide stool sample here. Advised of need for follow-up and return precaution      Auston Blush, MD 04/08/24 1006

## 2024-05-31 ENCOUNTER — Ambulatory Visit (INDEPENDENT_AMBULATORY_CARE_PROVIDER_SITE_OTHER): Admitting: Urology

## 2024-05-31 ENCOUNTER — Encounter: Payer: Self-pay | Admitting: Urology

## 2024-05-31 VITALS — BP 131/80 | HR 58

## 2024-05-31 DIAGNOSIS — R1030 Lower abdominal pain, unspecified: Secondary | ICD-10-CM

## 2024-05-31 DIAGNOSIS — R339 Retention of urine, unspecified: Secondary | ICD-10-CM

## 2024-05-31 LAB — BLADDER SCAN AMB NON-IMAGING: Scan Result: 327

## 2024-05-31 MED ORDER — TAMSULOSIN HCL 0.4 MG PO CAPS: 0.4000 mg | ORAL_CAPSULE | Freq: Every day | ORAL | 11 refills | Status: AC

## 2024-05-31 NOTE — Progress Notes (Signed)
 Name: Jorge [name] DOB: 1951/11/28 MRN: [id number]  History of Present Illness: Mr. Croghan is a 73 y.o. male who presents today for follow up visit at [facility] [place]. He is accompanied by his son Pixie Brighter, who provided interpretation (patient declined formal interpreter).  Relevant History includes: 1. BPH with incomplete bladder emptying.  - PVR = 419 ml on 03/28/2021. No hydronephrosis at that time or on CT imaging since then. His renal function has remained normal (on 04/08/2024 his GFR was >60 and creatinine was 0.88). 2. Bladder mass (benign).  - 02/18/2023: TURBT and urethral dilation by Dr. Claretta Croft.  At last visit with Dr. Claretta Croft on 02/24/2023: - No acute findings on surveillance cystoscopy.  - The plan was repeat surveillance cystoscopy in 1 year. Was also referred to General Surgery for right inguinal hernia.   Today: He reports that since about 2 weeks ago he has had bilateral lower abdominal / suprapubic pain, worse on the left than the right. The pain is worse when sitting up / leaning forward; minimal pain when walking or laying supine. He denies flank pain, fevers, nausea, or vomiting.  He denies acute urinary complaints. He reports occasional dysuria which he states is unrelated to his recent abdominal symptoms and resolves if he drinks more water . Denies increased urinary urgency, frequency, gross hematuria, hesitancy, straining to void, or sensations of incomplete emptying.  Denies any scrotal pain or swelling.  Per chart review he was seen in the ER on 04/08/2024 for similar complaints: "awoke with left side abdominal pain and diarrhea nb stool 8-10."  He reports alternating constipation / diarrhea. Denies rectal bleeding, pain with defecation, straining to defecate, or fecal incontinence.   Medications: Current Outpatient Medications  Medication Sig Dispense Refill   alum & mag hydroxide-simeth (MAALOX MAX) 400-400-40 MG/5ML suspension Take 15 mLs by mouth  every 6 (six) hours as needed for indigestion. 355 mL 0   chlorhexidine  (PERIDEX ) 0.12 % solution SMARTSIG:By Mouth     diclofenac  Sodium (VOLTAREN ) 1 % GEL Apply 2 g topically 4 (four) times daily to affected joint. 100 g 1   famotidine  (PEPCID ) 20 MG tablet Take 1 tablet (20 mg total) by mouth at bedtime. 90 tablet 3   gabapentin  (NEURONTIN ) 300 MG capsule Take 1 capsule (300 mg total) by mouth 3 (three) times daily. 90 capsule 1   HYDROcodone -acetaminophen  (NORCO) 5-325 MG tablet Take 1 tablet by mouth every 6 (six) hours as needed for moderate pain. 30 tablet 0   lisinopril  (ZESTRIL ) 10 MG tablet Take 1 tablet (10 mg total) by mouth daily. 90 tablet 1   ondansetron  (ZOFRAN -ODT) 8 MG disintegrating tablet Take 1 tablet (8 mg total) by mouth every 8 (eight) hours as needed for nausea or vomiting. 20 tablet 0   pantoprazole  (PROTONIX ) 40 MG tablet Take 1 tablet (40 mg total) by mouth daily. 90 tablet 3   polyethylene glycol (MIRALAX ) 17 g packet Take 17 g by mouth daily. 14 each 1   psyllium (METAMUCIL SMOOTH TEXTURE) 58.6 % powder Take 1 packet by mouth 3 (three) times daily. 283 g 12   dicyclomine  (BENTYL ) 20 MG tablet Take 0.5 tablets (10 mg total) by mouth 2 (two) times daily for 7 days. 7 tablet 0   tamsulosin  (FLOMAX ) 0.4 MG CAPS capsule Take 1 capsule (0.4 mg total) by mouth daily. 30 capsule 11   No current facility-administered medications for this visit.    Allergies: No Known Allergies  Past Medical History:  Diagnosis  Date   Acid reflux    Arthritis    right leg   Cataract    bil removed   Diabetes (HCC)    Hypertension    Past Surgical History:  Procedure Laterality Date   cataracts Bilateral    CYSTOSCOPY N/A 02/18/2023   Procedure: CYSTOSCOPY;  Surgeon: Marco Severs, MD;  Location: [facility];  Service: Urology;  Laterality: N/A;   LUMBAR LAMINECTOMY/DECOMPRESSION MICRODISCECTOMY Right 12/25/2016   Procedure: Lumbar three-five Laminectomy, Right Lumbar  three-four, Lumbar four-five Diskectomy ;  Surgeon: Raelene Bullocks Ditty, MD;  Location: [facility] OR;  Service: Neurosurgery;  Laterality: Right;   TRANSURETHRAL RESECTION OF BLADDER TUMOR N/A 02/18/2023   Procedure: TRANSURETHRAL RESECTION OF BLADDER TUMOR (TURBT)- prostatic urethra biopsy;  Surgeon: Marco Severs, MD;  Location: [facility];  Service: Urology;  Laterality: N/A;   Family History  Problem Relation Age of Onset   Colon cancer Neg Hx    Esophageal cancer Neg Hx    Pancreatic cancer Neg Hx    Prostate cancer Neg Hx    Rectal cancer Neg Hx    Stomach cancer Neg Hx    Social History   Socioeconomic History   Marital status: Married    Spouse name: Not on file   Number of children: 7   Years of education: Not on file   Highest education level: Not on file  Occupational History   Not on file  Tobacco Use   Smoking status: Former    Current packs/day: 0.00    Average packs/day: 0.3 packs/day for 40.0 years (10.0 ttl pk-yrs)    Types: Cigarettes    Start date: 12/17/1971    Quit date: 12/17/2011    Years since quitting: 12.4   Smokeless tobacco: Never  Vaping Use   Vaping status: Never Used  Substance and Sexual Activity   Alcohol use: No   Drug use: No   Sexual activity: Yes  Other Topics Concern   Not on file  Social History Narrative   Not on file   Social Drivers of Health   Financial Resource Strain: Patient Declined (05/18/2023)   Overall Financial Resource Strain (CARDIA)    Difficulty of Paying Living Expenses: Patient declined  Food Insecurity: No Food Insecurity (05/18/2023)   Hunger Vital Sign    Worried About Running Out of Food in the Last Year: Never true    Ran Out of Food in the Last Year: Never true  Transportation Needs: No Transportation Needs (05/18/2023)   PRAPARE - Administrator, Civil Service (Medical): No    Lack of Transportation (Non-Medical): No  Physical Activity: Unknown (05/18/2023)   Exercise Vital Sign    Days of  Exercise per Week: Patient declined    Minutes of Exercise per Session: Not on file  Recent Concern: Physical Activity - Insufficiently Active (03/23/2023)   Exercise Vital Sign    Days of Exercise per Week: 5 days    Minutes of Exercise per Session: 20 min  Stress: No Stress Concern Present (05/18/2023)   [employer] of Occupational Health - Occupational Stress Questionnaire    Feeling of Stress : Not at all  Social Connections: Unknown (05/18/2023)   Social Connection and Isolation Panel [NHANES]    Frequency of Communication with Friends and Family: More than three times a week    Frequency of Social Gatherings with Friends and Family: Three times a week    Attends Religious Services: Patient declined    Active Member  of Clubs or Organizations: No    Attends Engineer, structural: Not on file    Marital Status: Married  Recent Concern: Social Connections - Moderately Isolated (03/23/2023)   Social Connection and Isolation Panel [NHANES]    Frequency of Communication with Friends and Family: More than three times a week    Frequency of Social Gatherings with Friends and Family: More than three times a week    Attends Religious Services: Never    Database administrator or Organizations: No    Attends Banker Meetings: Never    Marital Status: Married  Catering manager Violence: Not At Risk (03/23/2023)   Humiliation, Afraid, Rape, and Kick questionnaire    Fear of Current or Ex-Partner: No    Emotionally Abused: No    Physically Abused: No    Sexually Abused: No    Review of Systems Constitutional: Patient denies any unintentional weight loss or change in strength lntegumentary: Patient denies any rashes or pruritus Cardiovascular: Patient denies chest pain or syncope Respiratory: Patient denies shortness of breath Gastrointestinal: As per HPI Musculoskeletal: Patient denies muscle cramps or weakness Neurologic: Patient denies convulsions or  seizures Allergic/Immunologic: Patient denies recent allergic reaction(s) Hematologic/Lymphatic: Patient denies bleeding tendencies Endocrine: Patient denies heat/cold intolerance  GU: As per HPI.  OBJECTIVE Vitals:   05/31/24 1414  BP: 131/80  Pulse: (!) 58   There is no height or weight on file to calculate BMI.  Physical Examination Constitutional: No obvious distress; patient is non-toxic appearing  Cardiovascular: No visible lower extremity edema.  Respiratory: The patient does not have audible wheezing/stridor; respirations do not appear labored  Gastrointestinal: Abdomen non-distended Musculoskeletal: Normal ROM of UEs  Skin: No obvious rashes/open sores  Neurologic: CN 2-12 grossly intact Psychiatric: Answered questions appropriately with normal affect  Hematologic/Lymphatic/Immunologic: No obvious bruises or sites of spontaneous bleeding  PVR: 327 ml  ASSESSMENT Incomplete bladder emptying - Plan: tamsulosin  (FLOMAX ) 0.4 MG CAPS capsule  Lower abdominal pain - Plan: Urinalysis, Routine w reflex microscopic, BLADDER SCAN AMB NON-IMAGING, tamsulosin  (FLOMAX ) 0.4 MG CAPS capsule  Urine retention - Plan: Urinalysis, Routine w reflex microscopic, BLADDER SCAN AMB NON-IMAGING, tamsulosin  (FLOMAX ) 0.4 MG CAPS capsule  We discussed that his lower abdominal discomfort is likely due to his chronic incomplete bladder emptying. Thankfully per chart review he has had no evidence of hydronephrosis on imaging, no history of recurrent UTIs, and stable renal function. We agreed to trial Flomax  (Tamsulosin ) 0.4 mg daily for BPH/BOO.   We agreed to plan for follow up in 6-8 weeks or sooner if needed. Patient verbalized understanding of and agreement with current plan. All questions were answered.  PLAN Advised the following: 1. Flomax  (Tamsulosin ) 0.4 mg daily. 2. Return in about 6 weeks (around 07/12/2024) for UA, PVR, & f/u with Griselda Lederer NP.  Orders Placed This Encounter   Procedures   Urinalysis, Routine w reflex microscopic   BLADDER SCAN AMB NON-IMAGING    It has been explained that the patient is to follow regularly with their PCP in addition to all other providers involved in their care and to follow instructions provided by these respective offices. Patient advised to contact urology clinic if any urologic-pertaining questions, concerns, new symptoms or problems arise in the interim period.  There are no Patient Instructions on file for this visit.  Electronically signed by:  Lauretta Ponto, FNP   05/31/24    3:04 PM

## 2024-06-20 ENCOUNTER — Ambulatory Visit: Admitting: Urology

## 2024-07-13 ENCOUNTER — Ambulatory Visit: Admitting: Urology

## 2024-08-19 ENCOUNTER — Other Ambulatory Visit: Payer: Self-pay | Admitting: Gastroenterology

## 2024-08-25 ENCOUNTER — Other Ambulatory Visit: Payer: Self-pay | Admitting: Gastroenterology

## 2024-09-25 ENCOUNTER — Telehealth: Payer: Self-pay | Admitting: Urology

## 2024-09-25 NOTE — Telephone Encounter (Signed)
 Called patient with language line Nepali with out an answer left vm for return call to office.

## 2024-09-25 NOTE — Telephone Encounter (Signed)
 Patient called into the office today with general questions/concerns regarding stomach pain in lower abdomen Patient may be reached at [phone] to discuss questions.

## 2024-09-27 ENCOUNTER — Emergency Department (HOSPITAL_BASED_OUTPATIENT_CLINIC_OR_DEPARTMENT_OTHER)

## 2024-09-27 ENCOUNTER — Other Ambulatory Visit: Payer: Self-pay

## 2024-09-27 ENCOUNTER — Encounter (HOSPITAL_BASED_OUTPATIENT_CLINIC_OR_DEPARTMENT_OTHER): Payer: Self-pay

## 2024-09-27 ENCOUNTER — Emergency Department (HOSPITAL_BASED_OUTPATIENT_CLINIC_OR_DEPARTMENT_OTHER)
Admission: EM | Admit: 2024-09-27 | Discharge: 2024-09-27 | Disposition: A | Discharge status: Home / Self Care | Attending: Emergency Medicine | Admitting: Emergency Medicine

## 2024-09-27 DIAGNOSIS — R109 Unspecified abdominal pain: Secondary | ICD-10-CM

## 2024-09-27 DIAGNOSIS — R1032 Left lower quadrant pain: Secondary | ICD-10-CM | POA: Insufficient documentation

## 2024-09-27 DIAGNOSIS — E119 Type 2 diabetes mellitus without complications: Secondary | ICD-10-CM | POA: Diagnosis not present

## 2024-09-27 DIAGNOSIS — Z7984 Long term (current) use of oral hypoglycemic drugs: Secondary | ICD-10-CM | POA: Insufficient documentation

## 2024-09-27 DIAGNOSIS — I10 Essential (primary) hypertension: Secondary | ICD-10-CM | POA: Insufficient documentation

## 2024-09-27 DIAGNOSIS — R110 Nausea: Secondary | ICD-10-CM | POA: Diagnosis not present

## 2024-09-27 DIAGNOSIS — Z79899 Other long term (current) drug therapy: Secondary | ICD-10-CM | POA: Diagnosis not present

## 2024-09-27 LAB — COMPREHENSIVE METABOLIC PANEL WITH GFR
ALT: 25 U/L (ref 0–44)
AST: 33 U/L (ref 15–41)
Albumin: 4.4 g/dL (ref 3.5–5.0)
Alkaline Phosphatase: 86 U/L (ref 38–126)
Anion gap: 12 (ref 5–15)
BUN: 7 mg/dL — ABNORMAL LOW (ref 8–23)
CO2: 24 mmol/L (ref 22–32)
Calcium: 9.8 mg/dL (ref 8.9–10.3)
Chloride: 103 mmol/L (ref 98–111)
Creatinine, Ser: 0.9 mg/dL (ref 0.61–1.24)
GFR, Estimated: >60 mL/min (ref >60)
Glucose, Bld: 105 mg/dL — ABNORMAL HIGH (ref 70–99)
Potassium: 3.8 mmol/L (ref 3.5–5.1)
Sodium: 138 mmol/L (ref 135–145)
Total Bilirubin: 0.7 mg/dL (ref 0.0–1.2)
Total Protein: 7.3 g/dL (ref 6.5–8.1)

## 2024-09-27 LAB — CBC
HCT: 41.1 % (ref 39.0–52.0)
Hemoglobin: 14 g/dL (ref 13.0–17.0)
MCH: 29.9 pg (ref 26.0–34.0)
MCHC: 34.1 g/dL (ref 30.0–36.0)
MCV: 87.6 fL (ref 80.0–100.0)
Platelets: 150 K/uL (ref 150–400)
RBC: 4.69 MIL/uL (ref 4.22–5.81)
RDW: 12 % (ref 11.5–15.5)
WBC: 3.7 K/uL — ABNORMAL LOW (ref 4.0–10.5)
nRBC: 0 % (ref 0.0–0.2)

## 2024-09-27 LAB — URINALYSIS, ROUTINE W REFLEX MICROSCOPIC
Bilirubin Urine: NEGATIVE
Glucose, UA: NEGATIVE mg/dL
Hgb urine dipstick: NEGATIVE
Ketones, ur: NEGATIVE mg/dL
Leukocytes,Ua: NEGATIVE
Nitrite: NEGATIVE
Protein, ur: NEGATIVE mg/dL
Specific Gravity, Urine: 1.007 (ref 1.005–1.030)
pH: 6.5 (ref 5.0–8.0)

## 2024-09-27 LAB — LIPASE, BLOOD: Lipase: 19 U/L (ref 11–51)

## 2024-09-27 MED ORDER — ONDANSETRON HCL 4 MG/2ML IJ SOLN
4.0000 mg | Freq: Once | INTRAMUSCULAR | Status: AC
  Administered 2024-09-27: 4 mg via INTRAVENOUS
  Filled 2024-09-27: qty 2

## 2024-09-27 MED ORDER — MORPHINE SULFATE (PF) 4 MG/ML IV SOLN
4.0000 mg | Freq: Once | INTRAVENOUS | Status: AC
  Administered 2024-09-27: 4 mg via INTRAVENOUS
  Filled 2024-09-27: qty 1

## 2024-09-27 MED ORDER — IOHEXOL 300 MG/ML  SOLN
100.0000 mL | Freq: Once | INTRAMUSCULAR | Status: AC | PRN
  Administered 2024-09-27: 100 mL via INTRAVENOUS

## 2024-09-27 NOTE — ED Provider Notes (Signed)
 [place] EMERGENCY DEPARTMENT AT [facility] Provider Note   CSN: [id number] Arrival date & time: 09/27/24  0522     Patient presents with: Abdominal Pain   Jorge [name] is a 73 y.o. male.   HPI     This is a 73 year old male who presents with concern for abdominal pain.  Reports lower abdominal pain and pressure left greater than right.  At times it radiates to his back.  Pressure with urination but no burning or dysuria.  No hematuria.  Reports nausea without vomiting.  Denies bloody stools.  At baseline has dizziness that is not new.  Has not had any fevers.     Prior to Admission medications   Medication Sig Start Date End Date Taking? Authorizing Provider  alum & mag hydroxide-simeth (MAALOX MAX) 400-400-40 MG/5ML suspension Take 15 mLs by mouth every 6 (six) hours as needed for indigestion. 04/08/23   Kingsley, Victoria K, DO  chlorhexidine  (PERIDEX ) 0.12 % solution SMARTSIG:By Mouth 08/02/23   [provider]  diclofenac  Sodium (VOLTAREN ) 1 % GEL Apply 2 g topically 4 (four) times daily to affected joint. 03/16/23   de [place], Raymond J, MD  dicyclomine  (BENTYL ) 20 MG tablet Take 0.5 tablets (10 mg total) by mouth 2 (two) times daily for 7 days. 05/18/23 05/25/23  de [place], Raymond J, MD  famotidine  (PEPCID ) 20 MG tablet TAKE 1 TABLET(20 MG) BY MOUTH AT BEDTIME 08/21/24   Nandigam, Kavitha V, MD  gabapentin  (NEURONTIN ) 300 MG capsule Take 1 capsule (300 mg total) by mouth 3 (three) times daily. 04/12/23   Booker Darice SAUNDERS, FNP  HYDROcodone -acetaminophen  (NORCO) 5-325 MG tablet Take 1 tablet by mouth every 6 (six) hours as needed for moderate pain. 02/18/23   McKenzie, Belvie CROME, MD  lisinopril  (ZESTRIL ) 10 MG tablet Take 1 tablet (10 mg total) by mouth daily. 04/12/23   Booker Darice SAUNDERS, FNP  ondansetron  (ZOFRAN -ODT) 8 MG disintegrating tablet Take 1 tablet (8 mg total) by mouth every 8 (eight) hours as needed for nausea or vomiting. 04/08/24   Levander Houston, MD   pantoprazole  (PROTONIX ) 40 MG tablet TAKE 1 TABLET(40 MG) BY MOUTH DAILY 08/25/24   Nandigam, Kavitha V, MD  polyethylene glycol (MIRALAX ) 17 g packet Take 17 g by mouth daily. 05/18/23   de [place], Quintin PARAS, MD  psyllium (METAMUCIL SMOOTH TEXTURE) 58.6 % powder Take 1 packet by mouth 3 (three) times daily. 04/08/23   Kingsley, Victoria K, DO  tamsulosin  (FLOMAX ) 0.4 MG CAPS capsule Take 1 capsule (0.4 mg total) by mouth daily. 05/31/24   Larocco, Sarah C, FNP    Allergies: Patient has no known allergies.    Review of Systems  Constitutional:  Negative for fever.  Respiratory:  Negative for shortness of breath.   Cardiovascular:  Negative for chest pain.  Gastrointestinal:  Positive for abdominal pain. Negative for constipation, diarrhea, nausea and vomiting.  Genitourinary:  Negative for dysuria and hematuria.  All other systems reviewed and are negative.   Updated Vital Signs BP (!) 154/82   Pulse (!) 54   Temp (!) 97.5 F (36.4 C)   Resp 15   Ht 1.575 m (5' 2)   Wt 82 kg   SpO2 94%   BMI 33.06 kg/m   Physical Exam Vitals and nursing note reviewed.  Constitutional:      Appearance: He is well-developed. He is not ill-appearing.  HENT:     Head: Normocephalic and atraumatic.  Eyes:     Pupils:  Pupils are equal, round, and reactive to light.  Cardiovascular:     Rate and Rhythm: Normal rate and regular rhythm.     Heart sounds: Normal heart sounds. No murmur heard. Pulmonary:     Effort: Pulmonary effort is normal. No respiratory distress.     Breath sounds: Normal breath sounds. No wheezing.  Abdominal:     General: Bowel sounds are normal.     Palpations: Abdomen is soft.     Tenderness: There is abdominal tenderness in the left lower quadrant. There is no guarding or rebound.  Musculoskeletal:     Cervical back: Neck supple.  Lymphadenopathy:     Cervical: No cervical adenopathy.  Skin:    General: Skin is warm and dry.  Neurological:     Mental Status: He is  alert and oriented to person, place, and time.  Psychiatric:        Mood and Affect: Mood normal.     (all labs ordered are listed, but only abnormal results are displayed) Labs Reviewed  COMPREHENSIVE METABOLIC PANEL WITH GFR - Abnormal; Notable for the following components:      Result Value   Glucose, Bld 105 (*)    BUN 7 (*)    All other components within normal limits  CBC - Abnormal; Notable for the following components:   WBC 3.7 (*)    All other components within normal limits  URINALYSIS, ROUTINE W REFLEX MICROSCOPIC - Abnormal; Notable for the following components:   Color, Urine COLORLESS (*)    All other components within normal limits  LIPASE, BLOOD    EKG: None  Radiology: CT ABDOMEN PELVIS W CONTRAST Result Date: 09/27/2024 CLINICAL DATA:  Left lower quadrant pain and painful urination. EXAM: CT ABDOMEN AND PELVIS WITH CONTRAST TECHNIQUE: Multidetector CT imaging of the abdomen and pelvis was performed using the standard protocol following bolus administration of intravenous contrast. RADIATION DOSE REDUCTION: This exam was performed according to the departmental dose-optimization program which includes automated exposure control, adjustment of the mA and/or kV according to patient size and/or use of iterative reconstruction technique. CONTRAST:  OMNIPAQUE  IOHEXOL  300 MG/ML  SOLN COMPARISON:  CTs with IV contrast 04/08/2024 and 01/02/2024. FINDINGS: Lower chest: No acute abnormality. Hepatobiliary: There is a 1 cm cyst in hepatic segment 3, Hounsfield density is 19. Otherwise no focal liver abnormality is seen. No gallstones, gallbladder wall thickening, or biliary dilatation. Pancreas: Mild fatty infiltration.  No mass.  No ductal dilatation. Spleen: No mass.  No splenomegaly. Adrenals/Urinary Tract: There is a Bosniak 2 subcentimeter cortical cyst in the inferior pole of the right kidney which is too small to characterize. This is unchanged. No follow-up imaging is  recommended. There is no adrenal or renal mass enhancement. There is no urinary stone or obstruction. Bladder thickness appears normal for the degree of distention. Stomach/Bowel: Contracted stomach with mild thickened folds. This is unchanged. No bowel dilatation or wall thickening. Normal caliber appendix. Scattered colonic diverticula without diverticulitis. Vascular/Lymphatic: Aortic atherosclerosis. No enlarged abdominal or pelvic lymph nodes. Reproductive: Enlarged prostate, unchanged. There appears to be a TURP defect. Other: No free fluid, free hemorrhage, free air or localized inflammatory process. There is no incarcerated hernia. 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 Decompressive laminectomy at L4. Acquired spinal stenosis L3-4 with degenerative changes of the thoracic and lumbar spine. No acute or other significant osseous findings. IMPRESSION: 1. No acute findings in the abdomen or pelvis. 2. Diverticulosis without evidence  of diverticulitis. 3. Prostatomegaly. 4. Aortic atherosclerosis. 5. 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 6. Old decompressive laminectomy at L4. 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 Aortic Atherosclerosis (ICD10-I70.0). Electronically Signed   By: Francis Quam M.D.   On: 09/27/2024 07:08     Procedures   Medications Ordered in the ED  morphine  (PF) 4 MG/ML injection 4 mg (4 mg Intravenous Given 09/27/24 0600)  ondansetron  (ZOFRAN ) injection 4 mg (4 mg Intravenous Given 09/27/24 0600)  iohexol  (OMNIPAQUE ) 300 MG/ML solution 100 mL (100 mLs Intravenous Contrast Given 09/27/24 9365)                                    Medical Decision Making Amount and/or Complexity of Data Reviewed Labs: ordered. Radiology: ordered.  Risk Prescription drug management.   This patient presents to the ED for concern of abdominal pain, this involves an extensive number of treatment options, and is a complaint  that carries with it a high risk of complications and morbidity.  I considered the following differential and admission for this acute, potentially life threatening condition.  The differential diagnosis includes UTI, kidney stones, diverticulitis, colitis  MDM:    This is a 73 year old male who presents with concern of abdominal pain.  Has some urinary symptoms with some pressure.  He is nontoxic.  Vital signs are notable for being afebrile.  Blood pressure 165/74.  He has some left lower quadrant tenderness to palpation.  Labs obtained.  Notable for some mild leukopenia.  CT abdomen pending.  (Labs, imaging, consults)  Labs: I Ordered, and personally interpreted labs.  The pertinent results include: CBC, CMP, urinalysis  Imaging Studies ordered: I ordered imaging studies including CT pending I independently visualized and interpreted imaging. I agree with the radiologist interpretation  Additional history obtained from chart review.  External records from outside source obtained and reviewed including prior evaluations  Cardiac Monitoring: The patient was maintained on a cardiac monitor.  If on the cardiac monitor, I personally viewed and interpreted the cardiac monitored which showed an underlying rhythm of: Sinus  Reevaluation: After the interventions noted above, I reevaluated the patient and found that they have :stayed the same  Social Determinants of Health:  lives independently  Disposition: Pending, signed out to oncoming provider  Co morbidities that complicate the patient evaluation  Past Medical History:  Diagnosis Date   Acid reflux    Arthritis    right leg   Cataract    bil removed   Diabetes (HCC)    Hypertension      Medicines Meds ordered this encounter  Medications   morphine  (PF) 4 MG/ML injection 4 mg   ondansetron  (ZOFRAN ) injection 4 mg   iohexol  (OMNIPAQUE ) 300 MG/ML solution 100 mL    I have reviewed the patients home medicines and have made  adjustments as needed  Problem List / ED Course: Problem List Items Addressed This Visit   None Visit Diagnoses       Abdominal pain, unspecified abdominal location    -  Primary                Final diagnoses:  Abdominal pain, unspecified abdominal location    ED Discharge Orders     None          Bari Charmaine FALCON, MD 09/27/24 2300

## 2024-09-27 NOTE — ED Provider Notes (Signed)
 CT is unremarkable.  There is diverticulosis but no diverticulitis.  Workup is unremarkable including negative UA.  Mild leukopenia of unclear cause.  He seems to be pretty comfortable right now.  At this point think is reasonable to have him follow-up with his PCP and his urologist.  Talking to granddaughter at the bedside, this pain has been an on-and-off problem for a while.  Appears stable for discharge with return precautions.   Jorge Hamilton, MD 09/27/24 [phone]

## 2024-09-27 NOTE — Discharge Instructions (Addendum)
 There is no clear cause for your abdominal pain at this time.  Follow-up with your primary care doctor and your urologist.  However, if the pain worsens, you develop fever, vomiting, or any other new/concerning symptoms then return to the ER.

## 2024-09-27 NOTE — ED Notes (Signed)
 Pt discharged home and given discharge paperwork. Opportunities given for questions. Pt verbalizes understanding. PIV removed x1. Bethena Powell SAUNDERS , RN

## 2024-09-27 NOTE — ED Triage Notes (Signed)
 Daughter reports that 2 days ago he started having abdominal pain and pain with urination occasionally. States he does not have any burning sensation from his penis. He has more pain from his abdomen when he urinates. Denies nausea or vomiting. Denies constipation or diarrhea. States he has ben taking Miralax .   Also reports dizziness for 6 months.

## 2024-11-14 ENCOUNTER — Emergency Department (HOSPITAL_BASED_OUTPATIENT_CLINIC_OR_DEPARTMENT_OTHER)

## 2024-11-14 ENCOUNTER — Other Ambulatory Visit: Payer: Self-pay

## 2024-11-14 ENCOUNTER — Emergency Department (HOSPITAL_BASED_OUTPATIENT_CLINIC_OR_DEPARTMENT_OTHER)
Admission: EM | Admit: 2024-11-14 | Discharge: 2024-11-14 | Disposition: A | Discharge status: Home / Self Care | Attending: Emergency Medicine | Admitting: Emergency Medicine

## 2024-11-14 DIAGNOSIS — R1032 Left lower quadrant pain: Secondary | ICD-10-CM | POA: Insufficient documentation

## 2024-11-14 DIAGNOSIS — D72819 Decreased white blood cell count, unspecified: Secondary | ICD-10-CM | POA: Insufficient documentation

## 2024-11-14 DIAGNOSIS — R1030 Lower abdominal pain, unspecified: Secondary | ICD-10-CM

## 2024-11-14 DIAGNOSIS — Z79899 Other long term (current) drug therapy: Secondary | ICD-10-CM | POA: Diagnosis not present

## 2024-11-14 DIAGNOSIS — D696 Thrombocytopenia, unspecified: Secondary | ICD-10-CM | POA: Diagnosis not present

## 2024-11-14 DIAGNOSIS — N50812 Left testicular pain: Secondary | ICD-10-CM | POA: Diagnosis not present

## 2024-11-14 DIAGNOSIS — I10 Essential (primary) hypertension: Secondary | ICD-10-CM | POA: Diagnosis not present

## 2024-11-14 DIAGNOSIS — E1165 Type 2 diabetes mellitus with hyperglycemia: Secondary | ICD-10-CM | POA: Insufficient documentation

## 2024-11-14 LAB — CBC
HCT: 39.2 % (ref 39.0–52.0)
Hemoglobin: 13.8 g/dL (ref 13.0–17.0)
MCH: 30.7 pg (ref 26.0–34.0)
MCHC: 35.2 g/dL (ref 30.0–36.0)
MCV: 87.3 fL (ref 80.0–100.0)
Platelets: 148 K/uL — ABNORMAL LOW (ref 150–400)
RBC: 4.49 MIL/uL (ref 4.22–5.81)
RDW: 12.2 % (ref 11.5–15.5)
WBC: 2.6 K/uL — ABNORMAL LOW (ref 4.0–10.5)
nRBC: 0 % (ref 0.0–0.2)

## 2024-11-14 LAB — COMPREHENSIVE METABOLIC PANEL WITH GFR
ALT: 24 U/L (ref 0–44)
AST: 32 U/L (ref 15–41)
Albumin: 4.3 g/dL (ref 3.5–5.0)
Alkaline Phosphatase: 91 U/L (ref 38–126)
Anion gap: 12 (ref 5–15)
BUN: 6 mg/dL — ABNORMAL LOW (ref 8–23)
CO2: 23 mmol/L (ref 22–32)
Calcium: 9.5 mg/dL (ref 8.9–10.3)
Chloride: 102 mmol/L (ref 98–111)
Creatinine, Ser: 0.96 mg/dL (ref 0.61–1.24)
GFR, Estimated: >60 mL/min (ref >60)
Glucose, Bld: 153 mg/dL — ABNORMAL HIGH (ref 70–99)
Potassium: 4 mmol/L (ref 3.5–5.1)
Sodium: 137 mmol/L (ref 135–145)
Total Bilirubin: 0.7 mg/dL (ref 0.0–1.2)
Total Protein: 7.5 g/dL (ref 6.5–8.1)

## 2024-11-14 LAB — URINALYSIS, ROUTINE W REFLEX MICROSCOPIC
Bilirubin Urine: NEGATIVE
Glucose, UA: NEGATIVE mg/dL
Hgb urine dipstick: NEGATIVE
Ketones, ur: NEGATIVE mg/dL
Leukocytes,Ua: NEGATIVE
Nitrite: NEGATIVE
Protein, ur: NEGATIVE mg/dL
Specific Gravity, Urine: 1.009 (ref 1.005–1.030)
pH: 6.5 (ref 5.0–8.0)

## 2024-11-14 LAB — LIPASE, BLOOD: Lipase: 15 U/L (ref 11–51)

## 2024-11-14 MED ORDER — IOHEXOL 300 MG/ML  SOLN
100.0000 mL | Freq: Once | INTRAMUSCULAR | Status: AC | PRN
Start: 2024-11-14 — End: 2024-11-14
  Administered 2024-11-14: 100 mL via INTRAVENOUS

## 2024-11-14 MED ORDER — MORPHINE SULFATE (PF) 4 MG/ML IV SOLN
4.0000 mg | Freq: Once | INTRAVENOUS | Status: AC
  Administered 2024-11-14: 4 mg via INTRAVENOUS
  Filled 2024-11-14: qty 1

## 2024-11-14 NOTE — ED Provider Notes (Signed)
 Patient here with LLQ abd and testicle pain. CT is pending If negative-d/c Physical Exam  BP 126/69   Pulse 64   Temp 98.5 F (36.9 C)   Resp 18   SpO2 94%   Physical Exam  Procedures  Procedures  ED Course / MDM   Clinical Course as of 11/14/24 1857  Tue Nov 14, 2024  1707 WBC(!): 2.6 [CA]  1707 Platelets(!): 148 [CA]    Clinical Course User Index [CA] Lorelle Aleck BROCKS, PA-C   Medical Decision Making Amount and/or Complexity of Data Reviewed Labs: ordered. Decision-making details documented in ED Course. Radiology: ordered.  Risk Prescription drug management.   Patient's work up negative for acute findings. I personally visualized and interpreted the images using our PACS system. Acute findings include:  Patient appears safe for discharge        Arloa Chroman, DEVONNA 11/14/24 2022    Jerrol Agent, MD 11/14/24 2106

## 2024-11-14 NOTE — ED Provider Notes (Signed)
  EMERGENCY DEPARTMENT AT [facility] Provider Note   CSN: [id number] Arrival date & time: 11/14/24  1551     Patient presents with: Abdominal Pain and Testicle Pain   Jorge [name] is a 73 y.o. male with a past medical history significant for hypertension, diabetes, benign bladder mass who presents to the ED due to left lower quadrant abdominal pain that radiates into left testicle x 1 to 2 weeks.  Patient denies any urinary symptoms.  No penile discharge.  Denies concerns for STIs.  History of diverticulitis.  Denies nausea, vomiting, diarrhea.  No fever or chills.  History obtained from patient and past medical records.  Patient declined official Nepali interpreter and had son interpret at bedside.      Prior to Admission medications   Medication Sig Start Date End Date Taking? Authorizing Provider  alum & mag hydroxide-simeth (MAALOX MAX) 400-400-40 MG/5ML suspension Take 15 mLs by mouth every 6 (six) hours as needed for indigestion. 04/08/23   Kingsley, Victoria K, DO  chlorhexidine  (PERIDEX ) 0.12 % solution SMARTSIG:By Mouth 08/02/23   [provider]  diclofenac  Sodium (VOLTAREN ) 1 % GEL Apply 2 g topically 4 (four) times daily to affected joint. 03/16/23   [place], Quintin PARAS, MD  dicyclomine  (BENTYL ) 20 MG tablet Take 0.5 tablets (10 mg total) by mouth 2 (two) times daily for 7 days. 05/18/23 05/25/23  [place], Raymond J, MD  famotidine  (PEPCID ) 20 MG tablet TAKE 1 TABLET(20 MG) BY MOUTH AT BEDTIME 08/21/24   Nandigam, Kavitha V, MD  gabapentin  (NEURONTIN ) 300 MG capsule Take 1 capsule (300 mg total) by mouth 3 (three) times daily. 04/12/23   Booker Darice SAUNDERS, FNP  HYDROcodone -acetaminophen  (NORCO) 5-325 MG tablet Take 1 tablet by mouth every 6 (six) hours as needed for moderate pain. 02/18/23   McKenzie, Belvie CROME, MD  lisinopril  (ZESTRIL ) 10 MG tablet Take 1 tablet (10 mg total) by mouth daily. 04/12/23   Booker Darice SAUNDERS, FNP  ondansetron  (ZOFRAN -ODT) 8 MG  disintegrating tablet Take 1 tablet (8 mg total) by mouth every 8 (eight) hours as needed for nausea or vomiting. 04/08/24   Levander Houston, MD  pantoprazole  (PROTONIX ) 40 MG tablet TAKE 1 TABLET(40 MG) BY MOUTH DAILY 08/25/24   Nandigam, Kavitha V, MD  polyethylene glycol (MIRALAX ) 17 g packet Take 17 g by mouth daily. 05/18/23   [place], Quintin PARAS, MD  psyllium (METAMUCIL SMOOTH TEXTURE) 58.6 % powder Take 1 packet by mouth 3 (three) times daily. 04/08/23   Kingsley, Victoria K, DO  tamsulosin  (FLOMAX ) 0.4 MG CAPS capsule Take 1 capsule (0.4 mg total) by mouth daily. 05/31/24   Larocco, Sarah C, FNP    Allergies: Patient has no known allergies.    Review of Systems  Gastrointestinal:  Positive for abdominal pain. Negative for diarrhea, nausea and vomiting.  Genitourinary:  Positive for testicular pain. Negative for dysuria, penile discharge and penile pain.    Updated Vital Signs BP 126/69   Pulse 64   Temp 98.5 F (36.9 C)   Resp 18   SpO2 94%   Physical Exam Vitals and nursing note reviewed.  Constitutional:      General: He is not in acute distress.    Appearance: He is not ill-appearing.  HENT:     Head: Normocephalic.  Eyes:     Pupils: Pupils are equal, round, and reactive to light.  Cardiovascular:     Rate and Rhythm: Normal rate and regular rhythm.  Pulses: Normal pulses.     Heart sounds: Normal heart sounds. No murmur heard.    No friction rub. No gallop.  Pulmonary:     Effort: Pulmonary effort is normal.     Breath sounds: Normal breath sounds.  Abdominal:     General: Abdomen is flat. There is no distension.     Palpations: Abdomen is soft.     Tenderness: There is abdominal tenderness. There is no guarding or rebound.     Comments: +LLQ tenderness  Genitourinary:    Comments: GU exam performed with chaperone in room.  Normal uncircumcised penis.  No testicular tenderness.  No testicular edema. Musculoskeletal:        General: Normal range of motion.      Cervical back: Neck supple.  Skin:    General: Skin is warm and dry.  Neurological:     General: No focal deficit present.     Mental Status: He is alert.  Psychiatric:        Mood and Affect: Mood normal.        Behavior: Behavior normal.     (all labs ordered are listed, but only abnormal results are displayed) Labs Reviewed  COMPREHENSIVE METABOLIC PANEL WITH GFR - Abnormal; Notable for the following components:      Result Value   Glucose, Bld 153 (*)    BUN 6 (*)    All other components within normal limits  CBC - Abnormal; Notable for the following components:   WBC 2.6 (*)    Platelets 148 (*)    All other components within normal limits  LIPASE, BLOOD  URINALYSIS, ROUTINE W REFLEX MICROSCOPIC    EKG: None  Radiology: No results found.   Procedures   Medications Ordered in the ED  morphine  (PF) 4 MG/ML injection 4 mg (4 mg Intravenous Given 11/14/24 1741)  iohexol  (OMNIPAQUE ) 300 MG/ML solution 100 mL (100 mLs Intravenous Contrast Given 11/14/24 1830)    Clinical Course as of 11/14/24 1859  Tue Nov 14, 2024  1707 WBC(!): 2.6 [CA]  1707 Platelets(!): 148 [CA]    Clinical Course User Index [CA] Lorelle Aleck BROCKS, PA-C                                 Medical Decision Making Amount and/or Complexity of Data Reviewed Independent Historian: caregiver    Details: Son provided some history Labs: ordered. Decision-making details documented in ED Course. Radiology: ordered and independent interpretation performed. Decision-making details documented in ED Course.  Risk Prescription drug management.   This patient presents to the ED for concern of abdominal pain, this involves an extensive number of treatment options, and is a complaint that carries with it a high risk of complications and morbidity.  The differential diagnosis includes diverticulitis, hernia, testicular torsion, epididymitis, etc  73 year old male presents to the ED due to left lower  quadrant abdominal pain that radiates to left testicle x 1 to 2 weeks.  History of benign bladder mass.  Denies any urinary symptoms.  No fever or chills.  No concern for STIs.  Upon arrival, vitals all within normal limits.  Patient well-appearing on exam.  Abdomen soft, nondistended with left lower quadrant tenderness.  GU exam performed with chaperone in room.  Normal uncircumcised penis.  No testicular tenderness or edema.  Routine labs ordered in triage.  IV morphine  given.  CT abdomen ordered to rule out diverticulitis or other  etiologies of pain. Scrotal US  ordered to rule out testicular torsion vs epididymis.  Per urology note, it was thought that his lower abdominal pain could be related to chronic incomplete bladder emptying.  CBC significant for leukopenia 2.6.  Thrombocytopenia 148.  Years patient has been leukopenic in the past.  Unclear etiology.  CMP with normal creatinine.  No major electrolyte derangements.  Hyperglycemia 153.  Lipase normal.  UA negative for signs of infection.  Patient handed off to Lavanda Lesches, PA-C at shift change pending CT abdomen and US . If unremarkable, patient may be discharged home with PCP follow-up.  Co morbidities that complicate the patient evaluation  Hx bladder mass  Social Determinants of Health:  Language barrier     Final diagnoses:  Lower abdominal pain  Left testicular pain  Leukopenia, unspecified type    ED Discharge Orders     None          Lorelle Aleck JAYSON DEVONNA 11/14/24 1859    Jerrol Agent, MD 11/14/24 [phone]

## 2024-11-14 NOTE — ED Notes (Signed)
 Pt d/c instructions, medications, and follow-up care reviewed with pt and family. Pt and family verbalized understanding and had no further questions at time of d/c. Pt CA&Ox4, ambulatory, and in NAD at time of d/c

## 2024-11-14 NOTE — ED Triage Notes (Signed)
 Pt POV reporting LLQ abd pain and L testicular pain x1 week, worsened today. 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

## 2024-11-14 NOTE — Discharge Instructions (Signed)
 ### Understanding Abdominal and Testicular Pain     **English Version**      You have been experiencing pain in your lower left abdomen and left testicle. After careful testing--including blood work, a CT scan of your abdomen and pelvis, and an ultrasound of your testicles--no urgent or dangerous problems were found. Your blood tests show a low white blood cell count (leukopenia), which your primary care provider will help you manage.      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 Hydroceles are usually harmless and often happen after past infections or inflammation. They can sometimes cause discomfort or pain, but most do not need surgery and can be watched over time.[5][2][3]      - **Chronic leukopenia:** Your low white blood cell count is not causing any urgent problems right now, but it should be followed by your regular doctor.      **What should you do next?**      - **Follow-up:** You may be referred to a urology clinic to keep an eye on the hydroceles and make sure they do not change or cause new problems.[5][2]      - **Monitor symptoms:** If you notice new symptoms like severe pain, swelling, fever, or changes in the size of your testicles, contact your doctor right away.      - **Routine care:** Continue regular check-ups with your primary care provider for your blood count and overall health.      **Summary:** Your pain does not appear to be caused by a serious or urgent problem. The hydroceles are likely from a past infection or inflammation and can be safely watched. Your low white blood cell count should be managed by your primary care provider. If your symptoms change, let your doctor know.      ---      **?????? ???????**       ??????? ????? ????? ??? ? ????? ????????? ????? ???? ?? ???? ???????, ??? ? ?????? ????????? ???? ???????, ? ????????? ????????????? ???? ????? ???? ?????? ?? ???????? ?????? ??????? ???? ??????? ???? ????????? ???? ?????????? ?? (????????????) ??????? ?, ??? ??????? ?????? ???????? ??????????      **???? ???? ?? ???**      - **???????? ?????? ???:** ???? ??????? ? ??????????????? ???????, ??????, ?? ?????????? ???? ????? ??????? ????[1][2][3][4]      - **??????????:** ??????????????? ??????? ?????? ???? ?????? ???? (??????????) ??????? ?? ?????????? ?????????? ???????? ?????? ? ?????? ??????? ?? ??????? ????? ????? ?????????? ?????? ?? ????? ??? ????, ?? ?????? ?????????? ?????? ?????? ? ???-????? ??????? ???? ???????[5][2][3]      - **????????????:** ??????? ???? ?????????? ?? ?? ??? ????? ???? ???????? ?????? ???, ?? ?? ??????? ?????? ???????? ?????? ?????      **?? ?? ??????**      - **???????:** ???????????? ??????? ???? ???????? ????????? ??? ??????? ???? ?????[5][2]      - **??????? ????? ??????????:** ??? ???? ????? ????? ????? ?????, ????, ?????, ?? ????????? ?????? ???????? ??????? ???????? ????????? ??????? ??????????      - **?????? ????:** ??????? ?????????? ? ????? ??????????? ???? ?????? ????????? ???? ????????????      **??????:** ??????? ????? ?????? ?? ???????? ????????? ???? ??????? ???? ?????????? ?????? ??????? ?? ?????? ???? ??? ???? ? ???????? ????? ??????? ???? ??????? ??????? ???? ?????????? ?? ???? ???? ?????? ???????? ?????? ????? ??????? ???????? ???? ????????? ??????? ?????????      ###  References  1. ACR Appropriateness Criteria Left Lower Quadrant Pain: 2023 Update. Emi GORMAN Cramp DH, Ashley GRASS, et al. Journal of the [employer] of Radiology : MANNING. 2023;20(11S):S471-S480. doi:[email or web address]. 2. Management of Hydrocele in Adolescent Patients. Cimador M, Castagnetti M, De Grazia E. Nature Reviews. Urology. 2010;7(7):379-85.  doi:[email or web address]. 3. Incidental Testicular Pathologies in Patients With Idiopathic Hydrocele Testis: Is Preoperative Scrotal Ultrasound Justified?SABRA Katherine HERO, Strohhacker K, Aigner F, et al. Anticancer Research. 2020;40(5):2861-2864. doi:10.21873/[email or web address]. 4. Testicular Pain - Not Always What It Seems: A Cross-Sectional Assessment of Patients Presenting for Chronic Scrotal Content Pain at a [facility]. Sischka MF, Tentis ER, Helo S, et al. Urology. 2023;174:18-22. doi:[email or web address]. 5. Scrotal Masses. Langan RC, Puente MEE. American Family Physician. 2022;106(2):184-189.

## 2025-01-30 ENCOUNTER — Emergency Department (HOSPITAL_BASED_OUTPATIENT_CLINIC_OR_DEPARTMENT_OTHER)
Admission: EM | Admit: 2025-01-30 | Discharge: 2025-01-30 | Disposition: A | Discharge status: Home / Self Care | Attending: Emergency Medicine | Admitting: Emergency Medicine

## 2025-01-30 ENCOUNTER — Emergency Department (HOSPITAL_BASED_OUTPATIENT_CLINIC_OR_DEPARTMENT_OTHER)

## 2025-01-30 ENCOUNTER — Other Ambulatory Visit: Payer: Self-pay

## 2025-01-30 DIAGNOSIS — R1013 Epigastric pain: Secondary | ICD-10-CM | POA: Insufficient documentation

## 2025-01-30 DIAGNOSIS — Z79899 Other long term (current) drug therapy: Secondary | ICD-10-CM | POA: Insufficient documentation

## 2025-01-30 DIAGNOSIS — G8929 Other chronic pain: Secondary | ICD-10-CM

## 2025-01-30 DIAGNOSIS — R42 Dizziness and giddiness: Secondary | ICD-10-CM | POA: Insufficient documentation

## 2025-01-30 DIAGNOSIS — R143 Flatulence: Secondary | ICD-10-CM | POA: Insufficient documentation

## 2025-01-30 DIAGNOSIS — R1032 Left lower quadrant pain: Secondary | ICD-10-CM | POA: Insufficient documentation

## 2025-01-30 LAB — CBC WITH DIFFERENTIAL/PLATELET
Abs Immature Granulocytes: 0.01 10*3/uL (ref 0.00–0.07)
Basophils Absolute: 0.1 10*3/uL (ref 0.0–0.1)
Basophils Relative: 1 %
Eosinophils Absolute: 0.1 10*3/uL (ref 0.0–0.5)
Eosinophils Relative: 3 %
HCT: 39.7 % (ref 39.0–52.0)
Hemoglobin: 14 g/dL (ref 13.0–17.0)
Immature Granulocytes: 0 %
Lymphocytes Relative: 43 %
Lymphs Abs: 1.7 10*3/uL (ref 0.7–4.0)
MCH: 30.7 pg (ref 26.0–34.0)
MCHC: 35.3 g/dL (ref 30.0–36.0)
MCV: 87.1 fL (ref 80.0–100.0)
Monocytes Absolute: 0.3 10*3/uL (ref 0.1–1.0)
Monocytes Relative: 8 %
Neutro Abs: 1.8 10*3/uL (ref 1.7–7.7)
Neutrophils Relative %: 45 %
Platelets: 164 10*3/uL (ref 150–400)
RBC: 4.56 MIL/uL (ref 4.22–5.81)
RDW: 12.3 % (ref 11.5–15.5)
WBC: 4 10*3/uL (ref 4.0–10.5)
nRBC: 0 % (ref 0.0–0.2)

## 2025-01-30 LAB — COMPREHENSIVE METABOLIC PANEL WITH GFR
ALT: 28 U/L (ref 0–44)
AST: 29 U/L (ref 15–41)
Albumin: 4.3 g/dL (ref 3.5–5.0)
Alkaline Phosphatase: 83 U/L (ref 38–126)
Anion gap: 11 (ref 5–15)
BUN: 7 mg/dL — ABNORMAL LOW (ref 8–23)
CO2: 25 mmol/L (ref 22–32)
Calcium: 9.8 mg/dL (ref 8.9–10.3)
Chloride: 103 mmol/L (ref 98–111)
Creatinine, Ser: 0.9 mg/dL (ref 0.61–1.24)
GFR, Estimated: >60 mL/min
Glucose, Bld: 112 mg/dL — ABNORMAL HIGH (ref 70–99)
Potassium: 3.9 mmol/L (ref 3.5–5.1)
Sodium: 139 mmol/L (ref 135–145)
Total Bilirubin: 0.8 mg/dL (ref 0.0–1.2)
Total Protein: 7.3 g/dL (ref 6.5–8.1)

## 2025-01-30 LAB — URINALYSIS, W/ REFLEX TO CULTURE (INFECTION SUSPECTED)
Bacteria, UA: NONE SEEN
Bilirubin Urine: NEGATIVE
Glucose, UA: NEGATIVE mg/dL
Hgb urine dipstick: NEGATIVE
Ketones, ur: NEGATIVE mg/dL
Leukocytes,Ua: NEGATIVE
Nitrite: NEGATIVE
Protein, ur: NEGATIVE mg/dL
Specific Gravity, Urine: 1.009 (ref 1.005–1.030)
pH: 7 (ref 5.0–8.0)

## 2025-01-30 LAB — TROPONIN T, HIGH SENSITIVITY: Troponin T High Sensitivity: 11 ng/L (ref 0–19)

## 2025-01-30 LAB — LIPASE, BLOOD: Lipase: 16 U/L (ref 11–51)

## 2025-01-30 MED ORDER — LACTATED RINGERS IV BOLUS
1000.0000 mL | Freq: Once | INTRAVENOUS | Status: AC
  Administered 2025-01-30: 1000 mL via INTRAVENOUS

## 2025-01-30 MED ORDER — FENTANYL CITRATE (PF) 50 MCG/ML IJ SOSY
50.0000 ug | PREFILLED_SYRINGE | Freq: Once | INTRAMUSCULAR | Status: AC
  Administered 2025-01-30: 50 ug via INTRAVENOUS
  Filled 2025-01-30: qty 1

## 2025-01-30 MED ORDER — LIDOCAINE VISCOUS HCL 2 % MT SOLN
15.0000 mL | Freq: Once | OROMUCOSAL | Status: AC
  Administered 2025-01-30: 15 mL via ORAL
  Filled 2025-01-30: qty 15

## 2025-01-30 MED ORDER — ALUM & MAG HYDROXIDE-SIMETH 200-200-20 MG/5ML PO SUSP
30.0000 mL | Freq: Once | ORAL | Status: AC
  Administered 2025-01-30: 30 mL via ORAL
  Filled 2025-01-30: qty 30

## 2025-01-30 MED ORDER — IOHEXOL 300 MG/ML  SOLN
100.0000 mL | Freq: Once | INTRAMUSCULAR | Status: AC | PRN
  Administered 2025-01-30: 100 mL via INTRAVENOUS

## 2025-01-30 NOTE — ED Triage Notes (Signed)
 Pt POV with daughter, c/o lower abdominal pain, burning in epi gastric and dizziness. Dealing with this for years per daughter but unbearable this morning.

## 2025-02-19 ENCOUNTER — Other Ambulatory Visit: Payer: Medicare Other | Admitting: Urology
